# Patient Record
Sex: Female | Born: 1975 | Race: Black or African American | Hispanic: No | Smoking: Current some day smoker
Health system: Southern US, Community
[De-identification: ages and names within clinical notes are randomized; demographics above are authoritative.]

## PROBLEM LIST (undated history)

## (undated) ENCOUNTER — Ambulatory Visit: Source: Home / Self Care

## (undated) ENCOUNTER — Inpatient Hospital Stay (HOSPITAL_COMMUNITY): Payer: Self-pay

## (undated) DIAGNOSIS — R51 Headache: Secondary | ICD-10-CM

## (undated) DIAGNOSIS — J45909 Unspecified asthma, uncomplicated: Secondary | ICD-10-CM

## (undated) DIAGNOSIS — L309 Dermatitis, unspecified: Secondary | ICD-10-CM

## (undated) DIAGNOSIS — G43909 Migraine, unspecified, not intractable, without status migrainosus: Secondary | ICD-10-CM

## (undated) DIAGNOSIS — G4730 Sleep apnea, unspecified: Secondary | ICD-10-CM

## (undated) DIAGNOSIS — T7840XA Allergy, unspecified, initial encounter: Secondary | ICD-10-CM

## (undated) DIAGNOSIS — F419 Anxiety disorder, unspecified: Secondary | ICD-10-CM

## (undated) DIAGNOSIS — R519 Headache, unspecified: Secondary | ICD-10-CM

## (undated) DIAGNOSIS — M722 Plantar fascial fibromatosis: Secondary | ICD-10-CM

## (undated) DIAGNOSIS — O139 Gestational [pregnancy-induced] hypertension without significant proteinuria, unspecified trimester: Secondary | ICD-10-CM

## (undated) DIAGNOSIS — F32A Depression, unspecified: Secondary | ICD-10-CM

## (undated) DIAGNOSIS — N200 Calculus of kidney: Secondary | ICD-10-CM

## (undated) DIAGNOSIS — O039 Complete or unspecified spontaneous abortion without complication: Secondary | ICD-10-CM

## (undated) DIAGNOSIS — M1990 Unspecified osteoarthritis, unspecified site: Secondary | ICD-10-CM

## (undated) DIAGNOSIS — F329 Major depressive disorder, single episode, unspecified: Secondary | ICD-10-CM

## (undated) HISTORY — DX: Complete or unspecified spontaneous abortion without complication: O03.9

## (undated) HISTORY — DX: Allergy, unspecified, initial encounter: T78.40XA

## (undated) HISTORY — DX: Depression, unspecified: F32.A

## (undated) HISTORY — DX: Major depressive disorder, single episode, unspecified: F32.9

## (undated) HISTORY — PX: TUBAL LIGATION: SHX77

## (undated) HISTORY — DX: Anxiety disorder, unspecified: F41.9

## (undated) HISTORY — DX: Dermatitis, unspecified: L30.9

## (undated) HISTORY — DX: Headache, unspecified: R51.9

## (undated) HISTORY — DX: Sleep apnea, unspecified: G47.30

## (undated) HISTORY — DX: Unspecified asthma, uncomplicated: J45.909

## (undated) HISTORY — PX: CERVICAL CERCLAGE: SHX1329

## (undated) HISTORY — DX: Headache: R51

---

## 1997-03-04 HISTORY — PX: MOUTH SURGERY: SHX715

## 1997-06-15 ENCOUNTER — Other Ambulatory Visit: Admission: RE | Admit: 1997-06-15 | Discharge: 1997-06-15 | Payer: Self-pay | Admitting: Obstetrics & Gynecology

## 1997-09-19 ENCOUNTER — Other Ambulatory Visit: Admission: RE | Admit: 1997-09-19 | Discharge: 1997-09-19 | Payer: Self-pay | Admitting: Obstetrics & Gynecology

## 1998-05-01 ENCOUNTER — Other Ambulatory Visit: Admission: RE | Admit: 1998-05-01 | Discharge: 1998-05-01 | Payer: Self-pay | Admitting: Obstetrics & Gynecology

## 1998-11-16 ENCOUNTER — Emergency Department (HOSPITAL_COMMUNITY): Admission: EM | Admit: 1998-11-16 | Discharge: 1998-11-16 | Payer: Self-pay | Admitting: Emergency Medicine

## 1998-11-16 ENCOUNTER — Encounter: Payer: Self-pay | Admitting: Emergency Medicine

## 1998-11-22 ENCOUNTER — Other Ambulatory Visit: Admission: RE | Admit: 1998-11-22 | Discharge: 1998-11-22 | Payer: Self-pay | Admitting: Obstetrics & Gynecology

## 1999-03-05 HISTORY — PX: DILATION AND CURETTAGE OF UTERUS: SHX78

## 1999-05-13 ENCOUNTER — Emergency Department (HOSPITAL_COMMUNITY): Admission: EM | Admit: 1999-05-13 | Discharge: 1999-05-13 | Payer: Self-pay | Admitting: Emergency Medicine

## 1999-05-13 ENCOUNTER — Encounter: Payer: Self-pay | Admitting: Emergency Medicine

## 1999-05-30 ENCOUNTER — Other Ambulatory Visit: Admission: RE | Admit: 1999-05-30 | Discharge: 1999-05-30 | Payer: Self-pay | Admitting: Obstetrics & Gynecology

## 1999-08-09 ENCOUNTER — Inpatient Hospital Stay (HOSPITAL_COMMUNITY): Admission: AD | Admit: 1999-08-09 | Discharge: 1999-08-25 | Payer: Self-pay | Admitting: Obstetrics & Gynecology

## 1999-08-14 ENCOUNTER — Encounter: Payer: Self-pay | Admitting: Obstetrics & Gynecology

## 1999-08-20 ENCOUNTER — Encounter: Payer: Self-pay | Admitting: Obstetrics & Gynecology

## 1999-08-21 ENCOUNTER — Encounter: Payer: Self-pay | Admitting: Obstetrics and Gynecology

## 1999-09-12 ENCOUNTER — Other Ambulatory Visit: Admission: RE | Admit: 1999-09-12 | Discharge: 1999-09-12 | Payer: Self-pay | Admitting: Obstetrics & Gynecology

## 2000-03-08 ENCOUNTER — Emergency Department (HOSPITAL_COMMUNITY): Admission: EM | Admit: 2000-03-08 | Discharge: 2000-03-08 | Payer: Self-pay | Admitting: Emergency Medicine

## 2001-04-14 ENCOUNTER — Other Ambulatory Visit: Admission: RE | Admit: 2001-04-14 | Discharge: 2001-04-14 | Payer: Self-pay | Admitting: Obstetrics & Gynecology

## 2002-05-17 ENCOUNTER — Other Ambulatory Visit: Admission: RE | Admit: 2002-05-17 | Discharge: 2002-05-17 | Payer: Self-pay | Admitting: Obstetrics & Gynecology

## 2002-09-27 ENCOUNTER — Observation Stay (HOSPITAL_COMMUNITY): Admission: AD | Admit: 2002-09-27 | Discharge: 2002-09-27 | Payer: Self-pay | Admitting: Obstetrics and Gynecology

## 2002-10-04 ENCOUNTER — Inpatient Hospital Stay (HOSPITAL_COMMUNITY): Admission: AD | Admit: 2002-10-04 | Discharge: 2002-10-04 | Payer: Self-pay | Admitting: Obstetrics and Gynecology

## 2002-10-04 ENCOUNTER — Encounter: Payer: Self-pay | Admitting: Obstetrics and Gynecology

## 2002-10-28 ENCOUNTER — Observation Stay (HOSPITAL_COMMUNITY): Admission: RE | Admit: 2002-10-28 | Discharge: 2002-10-29 | Payer: Self-pay | Admitting: Obstetrics & Gynecology

## 2003-01-08 ENCOUNTER — Inpatient Hospital Stay (HOSPITAL_COMMUNITY): Admission: AD | Admit: 2003-01-08 | Discharge: 2003-01-08 | Payer: Self-pay | Admitting: Obstetrics & Gynecology

## 2003-01-30 ENCOUNTER — Inpatient Hospital Stay (HOSPITAL_COMMUNITY): Admission: AD | Admit: 2003-01-30 | Discharge: 2003-02-02 | Payer: Self-pay | Admitting: *Deleted

## 2003-01-31 ENCOUNTER — Encounter (INDEPENDENT_AMBULATORY_CARE_PROVIDER_SITE_OTHER): Payer: Self-pay | Admitting: Specialist

## 2003-02-03 ENCOUNTER — Encounter: Admission: RE | Admit: 2003-02-03 | Discharge: 2003-03-05 | Payer: Self-pay | Admitting: Obstetrics & Gynecology

## 2003-02-10 ENCOUNTER — Inpatient Hospital Stay (HOSPITAL_COMMUNITY): Admission: AD | Admit: 2003-02-10 | Discharge: 2003-02-10 | Payer: Self-pay | Admitting: *Deleted

## 2003-03-06 ENCOUNTER — Encounter: Admission: RE | Admit: 2003-03-06 | Discharge: 2003-04-05 | Payer: Self-pay | Admitting: Obstetrics & Gynecology

## 2003-03-08 ENCOUNTER — Other Ambulatory Visit: Admission: RE | Admit: 2003-03-08 | Discharge: 2003-03-08 | Payer: Self-pay | Admitting: Obstetrics & Gynecology

## 2004-01-03 ENCOUNTER — Emergency Department (HOSPITAL_COMMUNITY): Admission: EM | Admit: 2004-01-03 | Discharge: 2004-01-04 | Payer: Self-pay | Admitting: Emergency Medicine

## 2004-03-04 HISTORY — PX: DILATION AND CURETTAGE OF UTERUS: SHX78

## 2004-03-28 ENCOUNTER — Other Ambulatory Visit: Admission: RE | Admit: 2004-03-28 | Discharge: 2004-03-28 | Payer: Self-pay | Admitting: Obstetrics & Gynecology

## 2004-03-29 ENCOUNTER — Encounter (INDEPENDENT_AMBULATORY_CARE_PROVIDER_SITE_OTHER): Payer: Self-pay | Admitting: *Deleted

## 2004-03-29 ENCOUNTER — Ambulatory Visit (HOSPITAL_COMMUNITY): Admission: RE | Admit: 2004-03-29 | Discharge: 2004-03-29 | Payer: Self-pay | Admitting: Obstetrics & Gynecology

## 2004-06-08 ENCOUNTER — Emergency Department (HOSPITAL_COMMUNITY): Admission: EM | Admit: 2004-06-08 | Discharge: 2004-06-08 | Payer: Self-pay | Admitting: Emergency Medicine

## 2004-11-22 ENCOUNTER — Inpatient Hospital Stay (HOSPITAL_COMMUNITY): Admission: AD | Admit: 2004-11-22 | Discharge: 2004-11-22 | Payer: Self-pay | Admitting: Obstetrics and Gynecology

## 2004-11-27 ENCOUNTER — Other Ambulatory Visit: Admission: RE | Admit: 2004-11-27 | Discharge: 2004-11-27 | Payer: Self-pay | Admitting: Obstetrics & Gynecology

## 2004-12-13 ENCOUNTER — Ambulatory Visit (HOSPITAL_COMMUNITY): Admission: RE | Admit: 2004-12-13 | Discharge: 2004-12-13 | Payer: Self-pay | Admitting: Obstetrics & Gynecology

## 2005-06-06 ENCOUNTER — Inpatient Hospital Stay (HOSPITAL_COMMUNITY): Admission: AD | Admit: 2005-06-06 | Discharge: 2005-06-09 | Payer: Self-pay | Admitting: Obstetrics and Gynecology

## 2005-06-06 ENCOUNTER — Inpatient Hospital Stay (HOSPITAL_COMMUNITY): Admission: AD | Admit: 2005-06-06 | Discharge: 2005-06-06 | Payer: Self-pay | Admitting: Obstetrics and Gynecology

## 2006-03-18 ENCOUNTER — Emergency Department (HOSPITAL_COMMUNITY): Admission: EM | Admit: 2006-03-18 | Discharge: 2006-03-18 | Payer: Self-pay | Admitting: Emergency Medicine

## 2009-07-27 ENCOUNTER — Encounter: Admission: RE | Admit: 2009-07-27 | Discharge: 2009-07-27 | Payer: Self-pay | Admitting: Family Medicine

## 2009-08-15 ENCOUNTER — Emergency Department (HOSPITAL_COMMUNITY): Admission: EM | Admit: 2009-08-15 | Discharge: 2009-08-16 | Payer: Self-pay | Admitting: Emergency Medicine

## 2010-05-21 LAB — DIFFERENTIAL
Basophils Absolute: 0 10*3/uL (ref 0.0–0.1)
Basophils Relative: 0 % (ref 0–1)
Eosinophils Relative: 1 % (ref 0–5)
Lymphs Abs: 2.9 10*3/uL (ref 0.7–4.0)
Monocytes Absolute: 0.5 10*3/uL (ref 0.1–1.0)

## 2010-05-21 LAB — WET PREP, GENITAL: Trich, Wet Prep: NONE SEEN

## 2010-05-21 LAB — GC/CHLAMYDIA PROBE AMP, GENITAL
Chlamydia, DNA Probe: NEGATIVE
GC Probe Amp, Genital: NEGATIVE

## 2010-05-21 LAB — URINALYSIS, ROUTINE W REFLEX MICROSCOPIC
Hgb urine dipstick: NEGATIVE
Protein, ur: NEGATIVE mg/dL
Urobilinogen, UA: 0.2 mg/dL (ref 0.0–1.0)

## 2010-05-21 LAB — BASIC METABOLIC PANEL
BUN: 6 mg/dL (ref 6–23)
CO2: 27 mEq/L (ref 19–32)
GFR calc Af Amer: >60 mL/min (ref >60)
GFR calc non Af Amer: >60 mL/min (ref >60)
Potassium: 3.7 mEq/L (ref 3.5–5.1)
Sodium: 139 mEq/L (ref 135–145)

## 2010-05-21 LAB — CBC
Hemoglobin: 13.4 g/dL (ref 12.0–15.0)
RBC: 4.69 MIL/uL (ref 3.87–5.11)
RDW: 13.2 % (ref 11.5–15.5)

## 2010-05-21 LAB — POCT PREGNANCY, URINE: Preg Test, Ur: NEGATIVE

## 2010-07-20 NOTE — Op Note (Signed)
[facility]  Patient:    Morgan [name], Morgan [name]                      MRN: [id number] Proc. Date: 08/10/99 Adm. Date:  [id number] Attending:  Minette Headland                           Operative Report  PREOPERATIVE DIAGNOSIS:       Intrauterine pregnancy at [redacted] weeks gestation, incompetent cervix.  POSTOPERATIVE DIAGNOSIS:      Intrauterine pregnancy at [redacted] weeks gestation, incompetent cervix.  OPERATION:                    Cervical cerclage.  SURGEON:                      Freddy Finner, M.D.  ASSISTANT:  ANESTHESIA:                   Spinal.  COMPLICATIONS:                None.  ESTIMATED BLOOD LOSS:         Less than 5 cc.  INDICATIONS:                  The patient was admitted on the evening prior to surgery with lower abdominal discomfort and vaginal spotting.  She was found to be 4 cm dilated on examination in the triage area.  There were no contractions. She was admitted overnight, treated with antibiotics, and was found brought to the operating room for cerclage.  DESCRIPTION OF PROCEDURE:     She was placed under adequate spinal anesthesia, placed in the dorsal lithotomy position using the Allen stirrup system. Betadine prep of the vulva was carried out and very gentle vaginal prep.  Posterior weighted vaginal retractor was used as were Deaver retractors to retract the anterior and lateral vaginal walls.  Inspection revealed the cervix to be dilated to approximately 4 with bulging membranes present in the os, but not prolapsing through the external os.  The cervix was essentially 90% effaced.  With great care, a #1 Prolene was used with superficial passes of the suture around the external os and this was tied down to reduce the membrane.  This effectively reduced the membrane in all, but one small location on the patients right at 8 oclock. This area was approximately 1 cm.  The 4 mm Mersilene suture was then placed on  a Mayo needle and again fairly superficial bites were taken around the cervix above the placement of the Prolene suture.  This was adequately accomplished and this suture too was then tied down and this completely closed the os and reduced the membranes so that they were no longer visible or palpable.  The knots were tied for both sutures anteriorly.  The procedure at this point was terminated.  All of the instruments were removed.  The patient was taken to the recovery room in satisfactory condition. DD:  08/10/99 TD:  08/10/99 Job: [id number]

## 2010-07-20 NOTE — H&P (Signed)
   NAME:  Morgan [name], Morgan [name]                    ACCOUNT NO.:  [id number]   MEDICAL RECORD NO.:  [id number]                   PATIENT TYPE:  AMB   LOCATION:  SDC                                  FACILITY:  [facility]   PHYSICIAN:  Freddy Finner, M.D.                DATE OF BIRTH:  [birth year]   DATE OF ADMISSION:  10/28/2002  DATE OF DISCHARGE:                                HISTORY & PHYSICAL   ADMISSION DIAGNOSES:  1. Intrauterine pregnancy 12-5/[redacted] weeks gestation.  2. 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   HISTORY OF PRESENT ILLNESS:  The patient is [name] 35 year old gravida 3, para 1  with no living children, who was documented with previous pregnancy to have  incompetent cervix. She is now at 12-5/[redacted] weeks gestation. By preliminary  ultrasound, the fetus is normal. The cervical length is 4 cm but there is  funneling at the internal os on ultrasound on October 26, 2002. She is  admitted now for cervical cerclage.   REVIEW OF SYSTEMS:  Current review of systems is negative.   PAST MEDICAL HISTORY/FAMILY HISTORY:  Are recorded in detail in the prenatal  summary included in the body of the chart and will not be repeated.   PHYSICAL EXAMINATION:  HEENT: Grossly within normal limits.  NECK: Thyroid gland is not palpably enlarged.  VITAL SIGNS: Blood pressure 112/70.  CHEST: Clear to auscultation.  HEART: Normal sinus rhythm without murmur, rub, or gallop.  EXTREMITIES: Without clubbing, cyanosis, or edema.   ASSESSMENT:  Intrauterine pregnancy, 12-5/[redacted] weeks gestation, incompetent  cervix.   PLAN:  Cervical cerclage.                                               Freddy Finner, M.D.    WRN/MEDQ  D:  10/27/2002  T:  10/27/2002  Job:  [id number]

## 2010-07-20 NOTE — Discharge Summary (Signed)
[facility]  Patient:    Morgan [name], Morgan [name]                      MRN: [id number] Adm. Date:  [id number] Disc. Date: [id number] Attending:  Minette Headland Dictator:   Danie Chandler, R.N.                           Discharge Summary  ADMISSION DIAGNOSIS:              Intrauterine pregnancy at 19-6/[redacted] weeks gestation with incompetent cervix.  DISCHARGE DIAGNOSIS:              Intrauterine pregnancy at 22 weeks with spontaneous vaginal delivery of a viable female infant with Apgars of 3 at one minute and 5 at five minutes with a secondary procedure of a dilatation and evacuation and removal of cerclage.  The baby did go to NICU.  PROCEDURE:                        Admitted on August 09, 1999, spontaneous vaginal delivery of a viable female infant with Apgars at 3 at one minute and 5 at five minutes on August 23, 1999, at 11:43.  HOSPITAL COURSE:                  The patient is a 36 year old black female at 22 weeks who a week ago had a cervical cerclage placed when she presented with advanced cervical dilatation.  She had been on magnesium sulfate and terbutaline tocolytics along with antibiotics.  She experienced increased pressure and contractions earlier on the day of August 23, 1999.  She was felt to have rupture of membranes.  She was examined and was noted to be completely dilated with no membranes noted.  Tocolytics were discontinued at that point. The parents expressed an interest in having maximum resuscitation performed depending the babys status at birth.  Dr. Alison Murray in NICU was notified and talked with them prior to delivery and was in attendance at delivery which occurred in the NICU.  The patient had a very easy spontaneous delivery of a vertex, a female.  There was spontaneous breathing and good fetal movement.  The cord was clamped, and the baby was passed to Dr. Alison Murray for evaluation and assignment of Apgars.  The weight was approximately 440 g.  The  fetus was intubated and was doing reasonably well and was transferred to NICU.  The patient did not pass the placenta and, despite Pitocin, had increased clotting and bleeding.  The decision was made to proceed with a dilatation and curettage.                                    On postpartum day #1, the patient was without complaint.  Her hemoglobin was 6.7, hematocrit 20.7, and white blood cell count 12.8.  The baby was still in NICU at this time.  The patient was started on iron twice a day and had a repeat CBC ordered for August 25, 1999. On August 25, 1999, the patients hemoglobin was stable at 6.3.  She was discharged to home on this day which was postpartum day #2.  CONDITION ON DISCHARGE:           Good.  DIET:  Regular as tolerated.  ACTIVITY:                         No heavy lifting, no vaginal entry.  FOLLOWUP:                         She is to follow up in the office in four to six weeks for a postpartum exam, and she is to call for a temperature greater than 100 degrees, persistent nausea or vomiting, heavy vaginal bleeding.  DISCHARGE MEDICATIONS:            Tylenol as directed by M.D., prenatal vitamin 1 p.o. q.d. and Chromagen Forte as directed by M.D. DD:  09/24/99 TD:  09/26/99 Job: [id number]

## 2010-07-20 NOTE — H&P (Signed)
NAMESHAUN, Morgan [name]          ACCOUNT NO.:  [id number]   MEDICAL RECORD NO.:  [id number]          PATIENT TYPE:  INP   LOCATION:                                FACILITY:  [facility]   PHYSICIAN:  Freddy Finner, M.D.   DATE OF BIRTH:  November 30, 1975   DATE OF ADMISSION:  06/06/2005  DATE OF DISCHARGE:                                HISTORY & PHYSICAL   ADMITTING DIAGNOSES:  1.  Intrauterine pregnancy 37-4/[redacted] weeks gestation.  2.  Chronic hypertension/pregnancy induced hypertension, symptomatic.   Patient is a 35 year old black married female gravida 5, para 0 who has a  very poor obstetrical history including two preterm deliveries in the 25-27  week range with premature rupture of membranes by history and cervical  incompetence.  With this pregnancy she had an early cervical cerclage.  She  had weekly progestin injections from 18 weeks until 35 weeks.  She had  vaginal treatment with amoxicillin alternating with clindesse as a way of  precluding vaginitis and Strep and she progressed to 37 weeks of gestation  when her cervical cerclage was removed.  Also, at approximately 36-1/2 to 37  weeks she was noted to have increasing hypertension with diastolics in the  96-98 range.  PIH panel showed alkaline phosphatase elevated on the 2nd of  April but all other parameters of that panel were normal.  She was seen in  the office on the 4th at which time a reactive non-stress test was obtained  but her pressure was still 138/98 and she was started on labetalol 100 mg  b.i.d.  She presented the evening after that visit with a severe bilateral  periorbital headache and nausea.  She was evaluated in MAU where laboratory  findings again were noted to be normal and her headache subsided with  Tylenol and pressure dropped with bed rest.  She was discharged home, but  presented again to the office this morning again with a blood pressure of  134/98 and a mild headache.  She was also noted to be hypokalemic  on  laboratory data done at the hospital on the evening prior to this admission.  Based on this constellation of findings, the fact that she is now at 37 and  4 and was scheduled for AROM induction in the morning, she is now admitted  for further management and delivery.   REVIEW OF SYSTEMS:  Negative except as noted above.  There are no GI  symptoms at the present time.   PAST MEDICAL HISTORY:  Outlined and detailed in the prenatal summary and  will not be repeated as is the family history.   PHYSICAL EXAMINATION:  HEENT:  Grossly within normal limits.  Thyroid gland  is not palpably enlarged.  Deep tendon reflexes are 2+ with no clonus.  VITAL SIGNS:  Blood pressure 134/98.  There is a trace of protein in the  urine.  CHEST:  Clear to auscultation throughout.  HEART:  Normal sinus rhythm without murmurs, rubs, or gallops.  ABDOMEN:  Gravid.  Estimated fetal size of at least 7-1/2 pounds.  PELVIC:  Cervix:  Long, 1 cm  at the internal os, soft, vertex at a -3  station.  EXTREMITIES:  +1 edema without cyanosis or clubbing.   ASSESSMENT:  1.  Intrauterine pregnancy 37-4/[redacted] weeks gestation.  2.  Very poor obstetrical history.  3.  Chronic hypertension/pregnancy induced hypertension.   PLAN:  Admission for correction of hypokalemia and for induction of labor.      Freddy Finner, M.D.  Electronically Signed     WRN/MEDQ  D:  06/06/2005  T:  06/06/2005  Job:  [id number]

## 2010-07-20 NOTE — Op Note (Signed)
Morgan [name], Morgan [name]          ACCOUNT NO.:  [id number]   MEDICAL RECORD NO.:  [id number]          PATIENT TYPE:  AMB   LOCATION:  SDC                           FACILITY:  [facility]   PHYSICIAN:  Freddy Finner, M.D.   DATE OF BIRTH:  [birth year]   DATE OF PROCEDURE:  03/29/2004  DATE OF DISCHARGE:                                 OPERATIVE REPORT   PREOPERATIVE DIAGNOSES:  Missed abortion.   POSTOPERATIVE DIAGNOSES:  Missed abortion.   PROCEDURE:  D&E.   ANESTHESIA:  Managed intravenous sedation and paracervical block.   BLOOD TYPE:  Rh positive.   The patient is a 35 year old, gravida 3, para 2 with known history  consistent with cervical incompetence.  She presented in early pregnancy  with this pregnancy on the 25th and a complaint of vaginal spotting.  Pelvic  ultrasound revealed nonviable 6 1/2 week fetus not consistent with  gestational age of [redacted] weeks and with a large subchorionic hemorrhage. The  patient is now admitted for D&E.   She was admitted on the morning of surgery, she was brought to the operating  room, placed under adequate intravenous sedation, placed in the dorsal  lithotomy position using the Daviston stirrup system. Betadine prep was carried  out in the usual fashion, a bivalve speculum was introduced into the vagina.  The cervix was visualized and grasped on the anterior lip with a single  tooth tenaculum.  Paracervical block was placed using 10 mL of 1% Xylocaine.  The injection sites were at 4 and 8 o'clock in the vaginal fornices. The  cervix was progressively dilated with Pratt's.  An 8 mm curved suction  cannula was introduced and aspiration produced obvious parts of conception.  This was continued until it was felt that the cavity was evacuated. This was  confirmed by gentle sharp curettage, exploration with Randall stone forceps  and repeat vacuum aspiration.  The patient was given 30 mg of Toradol IV.  She was awakened and taken to the recovery room in  good condition.  She will  be given routine outpatient surgical instructions to followup in the office  in one week. She was given Darvocet to be taken as needed for postoperative  pain unrelieved by over the counter preparations like Tylenol or ibuprofen.      WRN/MEDQ  D:  03/29/2004  T:  03/29/2004  Job:  [id number]

## 2010-07-20 NOTE — Op Note (Signed)
[facility]  Patient:    MALAYAH, DEMURO                      MRN: [id number] Proc. Date: 08/23/99 Adm. Date:  [id number] Disc. Date: [id number] Attending:  Minette Headland                           Operative Report  PREOPERATIVE DIAGNOSIS:       Retained placenta.  POSTOPERATIVE DIAGNOSIS:      Retained placenta.  OPERATION:                    Dilatation and evacuation, removal of cerclage.  SURGEON:                      Duke Salvia. Marcelle Overlie, M.D.  ASSISTANT:  ANESTHESIA:                   General endotracheal.  COMPLICATIONS:                None.  DRAINS:                       Foley catheter.  ESTIMATED BLOOD LOSS:         800 cc including blood clot.  INDICATIONS:                  The patient is a 22 week IUP who a week ago had salvage cerclage when she presented with advanced cervical dilatation.  She has been on magnesium and terbutaline tocolytics along with antibiotics.  She experienced increased pressure and contractions earlier today.  She was felt to have rupture of membranes. When I went to examine her she was noted to be completely dilated with no membranes noted.   Tocolytics were discontinued at that point.  The parents expressed an interest in having maximum resuscitation performed depending on the babys status at birth.  Dr. Loann Quill in NICU was notified and talked with them prior to delivery and was in attendance for the delivery which occurred in AICU.  I was present and when she felt pressure she asked to push.  A very easy spontaneous delivery of a vertex, a female.  There was spontaneous breathing and good fetal movement.  The cord was clamped and passed to Dr. Loann Quill for evaluation and assignment of Apgars.  The weight was approximately 440 grams.  This fetus was intubated and was doing reasonably well, transferred to the NICU.  She did not pass the placenta and despite Pitocin had increased clotting and bleeding.   Decision was made to proceed with dilatation and curettage.  DESCRIPTION OF PROCEDURE:     She was taken to the operating room and adequate level of general endotracheal anesthesia was obtained.  With the legs in stirrups, the perineal and vagina were prepped and draped in the usual manner for dilatation and evacuation.  A Foley catheter was already positioned. Weighted speculum was positioned.  Clot was removed.  The cerclage had torn through the cervix which was significantly dilated. Ring forceps was used to grasp the anterior cervical lip. Ring forceps used to remove a large portion of placenta which was visible at the os.  After the large piece was removed, a large blunt curet was used to explore the cavity.  Additional tissue was removed.  Pitocin was running through the case.  when no further tissue could be removed, the procedure was discontinued.  At that point, she had good hemostasis.  The uterine walls were cleaned by palpation with the curet.  The cerclage was removed at that point.  Some bleeding from where the cerclage had torn through the anterior cervical lip was sutured with 2-0 chromic interrupted sutures.  This was observed over five minutes and noted to be hemostasis. Uterus was firm with Pitocin.  She tolerated this well and went to recovery room in good condition. DD:  08/23/99 TD:  08/26/99 Job: [id number]

## 2010-07-20 NOTE — Op Note (Signed)
NAMEALIANNAH, Morgan [name]          ACCOUNT NO.:  [id number]   MEDICAL RECORD NO.:  [id number]           PATIENT TYPE:   LOCATION:                                 FACILITY:   PHYSICIAN:  Freddy Finner, M.D.        DATE OF BIRTH:   DATE OF PROCEDURE:  12/13/2004  DATE OF DISCHARGE:                                 OPERATIVE REPORT   PREOPERATIVE DIAGNOSES:  1.  Intrauterine pregnancy 13 weeks' gestation.  2.  Cervical incompetence.   POSTOPERATIVE DIAGNOSES:  1.  Intrauterine pregnancy 13 weeks' gestation.  2.  Cervical incompetence.   OPERATIVE PROCEDURE:  Cervical cerclage.   ANESTHESIA:  Spinal.   ESTIMATED INTRAOPERATIVE BLOOD LOSS:  10 mL.   INTRAOPERATIVE COMPLICATIONS:  None.   Patient is a 35 year old with documented cervical incompetence who has had  previous second trimester pregnancy losses.  She is now admitted at [redacted] weeks  gestation for cervical cerclage.   She was admitted on the morning of surgery.  She was given a bolus of  Rocephin IV.  She was brought to the operating room, placed under spinal  anesthesia, placed in the dorsal lithotomy position.  Betadine prep with  scrub followed by solution was carried out.  Sterile drapes were applied.  A  posterior weighted vaginal retractor was placed.  Jackson retractor was used  to retract the anterior and lateral vaginal walls.  Cervix was grasped on  the anterior lip with the ring forceps.  Using 4 mm umbilical type McDonald  style procedure was accomplished with vice all four quadrants.  This suture  was tied with a total of five knots anteriorly after checking to be certain  the cervix was occluded and it was and not even admitting a fingertip.  The  procedure at this point was terminated.  The patient was brought to the  recovery room in good condition.  She will be followed there until  ambulating normally and if there are no other issues or problems she will be  discharged same for followup in the office in a  week.  She was given  Darvocet-N 100 to be taken as needed for postoperative pain unrelieved by  Tylenol.      Freddy Finner, M.D.  Electronically Signed     WRN/MEDQ  D:  12/13/2004  T:  12/13/2004  Job:  [id number]

## 2010-07-20 NOTE — Op Note (Signed)
   NAME:  Morgan [name], Morgan [name]                    ACCOUNT NO.:  [id number]   MEDICAL RECORD NO.:  [id number]                   PATIENT TYPE:  OBV   LOCATION:  9304                                 FACILITY:  [facility]   PHYSICIAN:  Freddy Finner, M.D.                DATE OF BIRTH:  10-05-1975   DATE OF PROCEDURE:  DATE OF DISCHARGE:                                 OPERATIVE REPORT   PREOPERATIVE DIAGNOSES:  1. Intrauterine pregnancy at 12-5/7 weeks' gestation.  2. Incompetent cervix.   POSTOPERATIVE DIAGNOSES:  1. Intrauterine pregnancy at 12-5/7 weeks' gestation.  2. Incompetent cervix.   OPERATIVE PROCEDURE:  Cervical cerclage.   ANESTHESIA:  Spinal.   ESTIMATED INTRAOPERATIVE BLOOD LOSS:  50 mL of less.   INTRAOPERATIVE COMPLICATIONS:  None.   Details of the present illness are recorded in the admission note.  The  patient was admitted on the morning of surgery.  She was given 1 g of Ancef  IV preoperatively.  She was brought to the operating room, there placed  under adequate spinal anesthesia, placed in the dorsal lithotomy position  using the Newbern stirrup system.  [name] Betadine prep of mons, perineum, and  vagina was carried out in the usual fashion, sterile drapes were applied.  [name]  bivalve speculum was introduced into the vagina.  Cervix was visualized.  Using the ring forceps, the cervix was grasped and [name] McDonald suture placed.  The first attempt was not considered to be adequate with placement of the  second piece of the suture or second throw, the suture on the right lower  side.  For that reason the suture was removed.  The suture was then passed  starting on the left in McDonald fashion using [name] 5 mm umbilical tape.  The  knot was tied posteriorly at the 7 o'clock position with three knots.  Palpation of the cervix revealed adequate closure with suture appropriately  in the cervix.  The procedure at this point was terminated, all instruments  removed.  She was taken to  recovery in good condition.                                                Freddy Finner, M.D.    WRN/MEDQ  D:  10/28/2002  T:  10/29/2002  Job:  [id number]

## 2011-07-03 ENCOUNTER — Ambulatory Visit (INDEPENDENT_AMBULATORY_CARE_PROVIDER_SITE_OTHER): Payer: BC Managed Care – PPO | Admitting: Family Medicine

## 2011-07-03 VITALS — BP 124/85 | HR 76 | Temp 98.7°F | Resp 16 | Ht 66.0 in | Wt 201.0 lb

## 2011-07-03 DIAGNOSIS — R350 Frequency of micturition: Secondary | ICD-10-CM

## 2011-07-03 DIAGNOSIS — J302 Other seasonal allergic rhinitis: Secondary | ICD-10-CM

## 2011-07-03 DIAGNOSIS — N390 Urinary tract infection, site not specified: Secondary | ICD-10-CM

## 2011-07-03 DIAGNOSIS — R300 Dysuria: Secondary | ICD-10-CM

## 2011-07-03 LAB — POCT UA - MICROSCOPIC ONLY
Crystals, Ur, HPF, POC: NEGATIVE
Yeast, UA: NEGATIVE

## 2011-07-03 LAB — POCT URINALYSIS DIPSTICK
Glucose, UA: NEGATIVE
Ketones, UA: NEGATIVE
Spec Grav, UA: 1.02
Urobilinogen, UA: 1
pH, UA: 7.5

## 2011-07-03 MED ORDER — FLUCONAZOLE 150 MG PO TABS: 150.0000 mg | ORAL_TABLET | Freq: Once | ORAL | Status: AC

## 2011-07-03 MED ORDER — CIPROFLOXACIN HCL 250 MG PO TABS: 250.0000 mg | ORAL_TABLET | Freq: Two times a day (BID) | ORAL | Status: AC

## 2011-07-03 MED ORDER — FLUTICASONE PROPIONATE 50 MCG/ACT NA SUSP: 2.0000 | Freq: Every day | NASAL | Status: DC

## 2011-07-03 NOTE — Progress Notes (Signed)
  Subjective:    Patient ID: Morgan [name], female    DOB: 11-22-1975, 36 y.o.   MRN: [id number]  Urinary Tract Infection  This is a new problem. The current episode started yesterday. The problem occurs every urination. The problem has been gradually worsening. The quality of the pain is described as burning. The pain is at a severity of 4/10. The pain is moderate. There has been no fever. She is sexually active. There is no history of pyelonephritis. Associated symptoms include frequency and urgency. Pertinent negatives include no nausea, possible pregnancy or vomiting. The treatment provided no relief.   Last week treated by OB for BV(Tindamax)   2) Allergies- requests refill of Flonase  Review of Systems  Gastrointestinal: Negative for nausea and vomiting.  Genitourinary: Positive for urgency and frequency.       Objective:   Physical Exam  Constitutional: She appears well-developed.  HENT:  Mouth/Throat: Oropharynx is clear and moist.  Neck: Neck supple.  Cardiovascular: Normal rate, regular rhythm and normal heart sounds.   Pulmonary/Chest: Effort normal and breath sounds normal.  Abdominal: Soft. There is Tenderness: suprapubic.Marland Kitchen  Neurological: She is alert.  Skin: Skin is warm.    Results for orders placed in visit on 07/03/11  POCT UA - MICROSCOPIC ONLY      Component Value Range   WBC, Ur, HPF, POC 10-15     RBC, urine, microscopic 1-2     Bacteria, U Microscopic neg     Mucus, UA neg     Epithelial cells, urine per micros 0-1     Crystals, Ur, HPF, POC neg     Casts, Ur, LPF, POC neg     Yeast, UA neg    POCT URINALYSIS DIPSTICK      Component Value Range   Color, UA yellow     Clarity, UA sl. cloudy     Glucose, UA neg     Bilirubin, UA neg     Ketones, UA neg     Spec Grav, UA 1.020     Blood, UA trace-lysed     pH, UA 7.5     Protein, UA trace     Urobilinogen, UA 1.0     Nitrite, UA neg     Leukocytes, UA large (3+)    URINE CULTURE   Component Value Range   Colony Count NO GROWTH     Organism ID, Bacteria NO GROWTH           Assessment & Plan:   1. Dysuria  POCT UA - Microscopic Only, POCT Urinalysis Dipstick  2. UTI  POCT UA - Microscopic Only, POCT Urinalysis Dipstick, ciprofloxacin (CIPRO) 250 MG tablet, fluconazole (DIFLUCAN) 150 MG tablet  3. Seasonal allergies  fluticasone (FLONASE) 50 MCG/ACT nasal spray    Anticipatory guidance

## 2011-07-05 LAB — URINE CULTURE
Colony Count: NO GROWTH
Organism ID, Bacteria: NO GROWTH

## 2011-11-10 ENCOUNTER — Ambulatory Visit (INDEPENDENT_AMBULATORY_CARE_PROVIDER_SITE_OTHER): Payer: BC Managed Care – PPO | Admitting: Emergency Medicine

## 2011-11-10 VITALS — BP 122/82 | HR 79 | Temp 98.5°F | Resp 16 | Ht 65.5 in | Wt 202.0 lb

## 2011-11-10 DIAGNOSIS — T300 Burn of unspecified body region, unspecified degree: Secondary | ICD-10-CM

## 2011-11-10 DIAGNOSIS — N3000 Acute cystitis without hematuria: Secondary | ICD-10-CM

## 2011-11-10 LAB — POCT URINALYSIS DIPSTICK
Bilirubin, UA: NEGATIVE
Glucose, UA: NEGATIVE
Ketones, UA: NEGATIVE
Nitrite, UA: NEGATIVE
Spec Grav, UA: 1.02
Urobilinogen, UA: 0.2
pH, UA: 7.5

## 2011-11-10 LAB — POCT UA - MICROSCOPIC ONLY
Casts, Ur, LPF, POC: NEGATIVE
Crystals, Ur, HPF, POC: NEGATIVE
Mucus, UA: NEGATIVE
Yeast, UA: NEGATIVE

## 2011-11-10 MED ORDER — PHENAZOPYRIDINE HCL 200 MG PO TABS: 200.0000 mg | ORAL_TABLET | Freq: Three times a day (TID) | ORAL | Status: AC | PRN

## 2011-11-10 MED ORDER — FLUCONAZOLE 150 MG PO TABS: 150.0000 mg | ORAL_TABLET | Freq: Once | ORAL | Status: AC

## 2011-11-10 MED ORDER — SULFAMETHOXAZOLE-TRIMETHOPRIM 800-160 MG PO TABS: 1.0000 | ORAL_TABLET | Freq: Two times a day (BID) | ORAL | Status: AC

## 2011-11-10 NOTE — Progress Notes (Signed)
  Date:  11/10/2011   Name:  Morgan [name]   DOB:  15-Nov-1975   MRN:  [id number] Gender: female Age: 36 y.o.  PCP:  No primary provider on file.    Chief Complaint: Urinary Tract Infection   History of Present Illness:  Morgan [name] is a 36 y.o. pleasant patient who presents with the following:  Was ill earlier in the week with nausea and vomiting and loose stools now improved.  Has dysuria, urgency and frequency and some low back pain .  No fever or chills, no further nausea or vomiting.  No GI, GYN symptoms.  No history of frequent UTI's  There is no problem list on file for this patient.   No past medical history on file.  No past surgical history on file.  History  Substance Use Topics  . Smoking status: Never Smoker   . Smokeless tobacco: Not on file  . Alcohol Use: Not on file    No family history on file.  Allergies  Allergen Reactions  . Stadol (Butorphanol Tartrate) Itching    Medication list has been reviewed and updated.  Current Outpatient Prescriptions on File Prior to Visit  Medication Sig Dispense Refill  . fluticasone (FLONASE) 50 MCG/ACT nasal spray Place 2 sprays into the nose daily.  1 g  6  . levonorgestrel (MIRENA) 20 MCG/24HR IUD 1 each by Intrauterine route once.        Review of Systems:  As per HPI, otherwise negative.    Physical Examination: Filed Vitals:   11/10/11 1636  BP: 122/82  Pulse: 79  Temp: 98.5 F (36.9 C)  Resp: 16   Filed Vitals:   11/10/11 1636  Height: 5' 5.5" (1.664 m)  Weight: 202 lb (91.627 kg)   Body mass index is 33.10 kg/(m^2). Ideal Body Weight: Weight in (lb) to have BMI = 25: 152.2    GEN: WDWN, NAD, Non-toxic, Alert & Oriented x 3 HEENT: Atraumatic, Normocephalic.  Ears and Nose: No external deformity. EXTR: No clubbing/cyanosis/edema NEURO: Normal gait.  PSYCH: Normally interactive. Conversant. Not depressed or anxious appearing.  Calm demeanor.  BACK:  No CVA  tenderness ABDOMEN:  Benign, soft, not tender.  Assessment and Plan: Acute cystitis Septra Pyridium Follow up as needed  Carmelina Dane, MD  Results for orders placed in visit on 11/10/11  POCT URINALYSIS DIPSTICK      Component Value Range   Color, UA yellow     Clarity, UA cloudy     Glucose, UA negative     Bilirubin, UA negative     Ketones, UA negative     Spec Grav, UA 1.020     Blood, UA small     pH, UA 7.5     Protein, UA trace     Urobilinogen, UA 0.2     Nitrite, UA negative     Leukocytes, UA large (3+)     At f/u will do STD screen if symptoms not resolved I have reviewed and agree with documentation. Robert P. Merla Riches, M.D.

## 2012-02-27 ENCOUNTER — Ambulatory Visit (INDEPENDENT_AMBULATORY_CARE_PROVIDER_SITE_OTHER): Payer: BC Managed Care – PPO | Admitting: Physician Assistant

## 2012-02-27 VITALS — BP 124/90 | HR 70 | Temp 98.6°F | Resp 16 | Ht 66.0 in | Wt 193.0 lb

## 2012-02-27 DIAGNOSIS — R350 Frequency of micturition: Secondary | ICD-10-CM

## 2012-02-27 DIAGNOSIS — R319 Hematuria, unspecified: Secondary | ICD-10-CM

## 2012-02-27 DIAGNOSIS — R300 Dysuria: Secondary | ICD-10-CM

## 2012-02-27 LAB — POCT URINALYSIS DIPSTICK
Bilirubin, UA: NEGATIVE
Nitrite, UA: NEGATIVE
Protein, UA: 100
Spec Grav, UA: 1.025

## 2012-02-27 LAB — POCT UA - MICROSCOPIC ONLY
Casts, Ur, LPF, POC: NEGATIVE
Mucus, UA: NEGATIVE
Yeast, UA: NEGATIVE

## 2012-02-27 MED ORDER — CIPROFLOXACIN HCL 500 MG PO TABS: 500.0000 mg | ORAL_TABLET | Freq: Two times a day (BID) | ORAL | Status: DC

## 2012-02-27 NOTE — Progress Notes (Signed)
Patient ID: Morgan [name] MRN: [id number], DOB: Dec 14, 1975, 36 y.o. Date of Encounter: 02/27/2012, 9:18 AM  Primary Physician: No primary provider on file.  Chief Complaint: urinary frequency and dysuria  HPI: 36 y.o. year old female with presents with 2 day history of urinary frequency, dysuria, and suprapubic pressure. Denies flank pain, nausea, vomiting, fever, chills, vaginal discharge, or odor. Has increased fluids, but states over this week she has been dehydrated. LNMP 02/12/12. Does admit to frequent UTI's.  Patient is otherwise doing well without issues or complaints.  History reviewed. No pertinent past medical history.   Home Meds: Prior to Admission medications   Medication Sig Start Date End Date Taking? Authorizing Provider  fluticasone (FLONASE) 50 MCG/ACT nasal spray Place 2 sprays into the nose daily. 07/03/11 07/02/12 Yes Dois Davenport, MD  ciprofloxacin (CIPRO) 500 MG tablet Take 1 tablet (500 mg total) by mouth 2 (two) times daily. 02/27/12   Nelva Nay, PA-C  levonorgestrel (MIRENA) 20 MCG/24HR IUD 1 each by Intrauterine route once.    Historical Provider, MD    Allergies:  Allergies  Allergen Reactions  . Stadol (Butorphanol Tartrate) Itching    History   Social History  . Marital Status: Married    Spouse Name: N/A    Number of Children: N/A  . Years of Education: N/A   Occupational History  . Not on file.   Social History Main Topics  . Smoking status: Never Smoker   . Smokeless tobacco: Not on file  . Alcohol Use: Not on file  . Drug Use: Not on file  . Sexually Active: Not on file   Other Topics Concern  . Not on file   Social History Narrative  . No narrative on file     Review of Systems: Constitutional: negative for chills, fever, night sweats, weight changes, or fatigue  HEENT: negative for vision changes, hearing loss, congestion, rhinorrhea, ST, epistaxis, or sinus pressure Cardiovascular: negative for chest pain or  palpitations Respiratory: negative for cough, hemoptysis, wheezing, shortness of breath. Abdominal: positive for suprapubic abdominal pain,   No nausea, vomiting, diarrhea, or constipation Genitourinary: positive for urinary frequency and dysuria. Negative for vaginal discharge, odor, or pelvic pain.  Dermatological: negative for rashes. Neurologic: negative for headache, dizziness, or syncope   Physical Exam: Blood pressure 124/90, pulse 70, temperature 98.6 F (37 C), temperature source Oral, resp. rate 16, height 5\' 6"  (1.676 m), weight 193 lb (87.544 kg), last menstrual period 02/12/2012, SpO2 99.00%., Body mass index is 31.15 kg/(m^2). General: Well developed, well nourished, in no acute distress. Head: Normocephalic, atraumatic, eyes without discharge, sclera non-icteric, nares are without discharge. External ear normal in appearance. Neck: Supple. No thyromegaly. Full ROM. No lymphadenopathy. Lungs: Clear bilaterally to auscultation without wheezes, rales, or rhonchi. Breathing is unlabored. Heart: RRR with S1 S2. No murmurs, rubs, or gallops appreciated. Abdominal: +BS x 4. No hepatosplenomegaly, rebound tenderness, or guarding. Positive suprapubic tenderness. No CVA tenderness bilaterally.  Msk:  Strength and tone normal for age. Extremities/Skin: Warm and dry. No clubbing or cyanosis. No edema.  Neuro: Alert and oriented X 3. Moves all extremities spontaneously. Gait is normal. CNII-XII grossly in tact. Psych:  Responds to questions appropriately with a normal affect.   Labs: Results for orders placed in visit on 02/27/12  POCT URINALYSIS DIPSTICK      Component Value Range   Color, UA yellow     Clarity, UA cloudy     Glucose, UA neg  Bilirubin, UA neg     Ketones, UA neg     Spec Grav, UA 1.025     Blood, UA moderate     pH, UA 7.0     Protein, UA 100     Urobilinogen, UA 0.2     Nitrite, UA neg     Leukocytes, UA large (3+)    POCT UA - MICROSCOPIC ONLY       Component Value Range   WBC, Ur, HPF, POC TNTC     RBC, urine, microscopic TNTC     Bacteria, U Microscopic 1+     Mucus, UA neg     Epithelial cells, urine per micros 2-6     Crystals, Ur, HPF, POC neg     Casts, Ur, LPF, POC neg     Yeast, UA neg        ASSESSMENT AND PLAN:  36 y.o. year old female with urinary tract infection Urine culture sent Increase fluids Cipro 500 mg bid x 5 days Azo as needed for symptomatic relief Follow up if symptoms worsen or fail to improve.  Grier Mitts, PA-C 02/27/2012 9:18 AM

## 2012-02-28 LAB — URINE CULTURE: Colony Count: 50000

## 2012-02-29 ENCOUNTER — Other Ambulatory Visit: Payer: Self-pay | Admitting: Physician Assistant

## 2012-02-29 MED ORDER — FLUCONAZOLE 150 MG PO TABS: 150.0000 mg | ORAL_TABLET | Freq: Once | ORAL | Status: DC

## 2012-02-29 MED ORDER — NITROFURANTOIN MONOHYD MACRO 100 MG PO CAPS: 100.0000 mg | ORAL_CAPSULE | Freq: Two times a day (BID) | ORAL | Status: DC

## 2012-04-29 ENCOUNTER — Encounter: Payer: Self-pay | Admitting: Family Medicine

## 2012-04-29 ENCOUNTER — Ambulatory Visit (INDEPENDENT_AMBULATORY_CARE_PROVIDER_SITE_OTHER): Payer: BC Managed Care – PPO | Admitting: Family Medicine

## 2012-04-29 VITALS — BP 111/76 | HR 68 | Temp 98.0°F | Resp 16 | Ht 65.5 in | Wt 198.0 lb

## 2012-04-29 DIAGNOSIS — G43909 Migraine, unspecified, not intractable, without status migrainosus: Secondary | ICD-10-CM

## 2012-04-29 MED ORDER — PROPRANOLOL HCL 10 MG PO TABS: 10.0000 mg | ORAL_TABLET | Freq: Every day | ORAL | Status: DC

## 2012-04-29 MED ORDER — TRAMADOL HCL 50 MG PO TABS: 50.0000 mg | ORAL_TABLET | Freq: Three times a day (TID) | ORAL | Status: DC | PRN

## 2012-04-29 MED ORDER — KETOROLAC TROMETHAMINE 60 MG/2ML IM SOLN
60.0000 mg | Freq: Once | INTRAMUSCULAR | Status: AC
  Administered 2012-04-29: 60 mg via INTRAMUSCULAR

## 2012-04-29 NOTE — Progress Notes (Signed)
SUBJECTIVE:  Morgan [name] is a 37 y.o. female who complains of migraine headache for 2 day(s). She has a well established history of recurrent migraines.  Patient states now it is occuring more than 2 times a month.   Description of pain: throbbing pain, dull pain, squeezing pain, unilateral in the left frontal area, unilateral in the left temporal area. Associated symptoms: light sensitivity and nausea. Patient has already taken ibuprofen for this headache without relief.  Current Outpatient Prescriptions  Medication Sig Dispense Refill  . propranolol (INDERAL) 10 MG tablet Take 1 tablet (10 mg total) by mouth daily.  30 tablet  1  . traMADol (ULTRAM) 50 MG tablet Take 1 tablet (50 mg total) by mouth every 8 (eight) hours as needed for pain.  30 tablet  0   No current facility-administered medications for this visit.    There are no associated abnormal neurological symptoms such as TIA's, loss of balance, loss of vision or speech, numbness or weakness on review. Past neurological history: negative for stroke, MS, epilepsy, or brain tumor.   OBJECTIVE:  Patient appears in pain, preferring to lie in a darkened room. Her vitals are normal. Alert and oriented x 3.  Ears and throat normal. Neck fully supple without nodes. Sinuses non tender. Cranial nerves are normal.  Fundi are normal with sharp disc margins, no papilledema, hemorrhages or exudates noted. DTR's normal and symmetric. Babinski sign absent.  Mental status normal. Cerebellar function normal.   ASSESSMENT:  Migraine headache  PLAN:  Treatment today -  see orders as documented in the electronic medical record. Will start ppx therapy with increase frequency of headaches.  ROV prn if pain does not resolve after treatment.

## 2012-07-10 ENCOUNTER — Ambulatory Visit (INDEPENDENT_AMBULATORY_CARE_PROVIDER_SITE_OTHER): Payer: BC Managed Care – PPO | Admitting: Family Medicine

## 2012-07-10 VITALS — BP 116/70 | HR 88 | Temp 98.8°F | Resp 16 | Ht 65.0 in | Wt 198.0 lb

## 2012-07-10 DIAGNOSIS — J3489 Other specified disorders of nose and nasal sinuses: Secondary | ICD-10-CM

## 2012-07-10 MED ORDER — CEFDINIR 300 MG PO CAPS: 300.0000 mg | ORAL_CAPSULE | Freq: Two times a day (BID) | ORAL | Status: DC

## 2012-07-10 MED ORDER — PREDNISONE 20 MG PO TABS: ORAL_TABLET | ORAL | Status: DC

## 2012-07-10 NOTE — Progress Notes (Signed)
[facility] [address] [place] [address] [phone]  Date:  07/10/2012   Name:  Morgan [name]   DOB:  [birth year]   MRN:  [id number]  PCP:  Nilda Simmer, MD    Chief Complaint: URI   History of Present Illness:  Morgan [name] is a 37 y.o. very pleasant female patient who presents with the following:  She notes a possible sinus infection.  She noted that her "glands swelled" 2 days ago.  She has sinus pressure and PND.   She awoke sweaty yesterday but has not noted a fever.  No body aches.  She has a mild cough.  She does have sneezing and runny nose.    No GI symptoms.    She has not tried any medications as of yet.  She has a CPAP machine with nasal pillow and has not been able to use it due to her symptoms.    She has her period right now.    There are no active problems to display for this patient.   Past Medical History  Diagnosis Date  . Allergy   . Sleep apnea   . Anxiety   . Depression     Past Surgical History  Procedure Laterality Date  . Mouth surgery  1999    History  Substance Use Topics  . Smoking status: Never Smoker   . Smokeless tobacco: Not on file  . Alcohol Use: Yes     Comment: occasional    Family History  Problem Relation Age of Onset  . HIV Mother   . Heart disease Maternal Grandfather   . Diabetes Paternal Grandmother   . Cancer Paternal Grandfather     Allergies  Allergen Reactions  . Stadol (Butorphanol Tartrate) Itching    Medication list has been reviewed and updated.  Current Outpatient Prescriptions on File Prior to Visit  Medication Sig Dispense Refill  . traMADol (ULTRAM) 50 MG tablet Take 1 tablet (50 mg total) by mouth every 8 (eight) hours as needed for pain.  30 tablet  0  . propranolol (INDERAL) 10 MG tablet Take 1 tablet (10 mg total) by mouth daily.  30 tablet  1   No current facility-administered medications on file prior to visit.    Review of Systems:  As per  HPI- otherwise negative.   Physical Examination: Filed Vitals:   07/10/12 1238  BP: 116/70  Pulse: 88  Temp: 98.8 F (37.1 C)  Resp: 16   Filed Vitals:   07/10/12 1238  Height: 5\' 5"  (1.651 m)  Weight: 198 lb (89.812 kg)   Body mass index is 32.95 kg/(m^2). Ideal Body Weight: Weight in (lb) to have BMI = 25: 149.9  GEN: WDWN, NAD, Non-toxic, A & O x 3 HEENT: Atraumatic, Normocephalic. Neck supple. No masses, No LAD. Ears and Nose: No external deformity. CV: RRR, No M/G/R. No JVD. No thrill. No extra heart sounds. PULM: CTA B, no wheezes, crackles, rhonchi. No retractions. No resp. distress. No accessory muscle use. ABD: S, NT, ND, +BS. No rebound. No HSM. EXTR: No c/c/e NEURO Normal gait.  PSYCH: Normally interactive. Conversant. Not depressed or anxious appearing.  Calm demeanor.    Assessment and Plan: Sinus pain - Plan: predniSONE (DELTASONE) 20 MG tablet, cefdinir (OMNICEF) 300 MG capsule  Sinus pain, likely due to viral URI or allergies.  It is causing her distress and interfering with her use of her CPAP machine.   rx for prednisone 40  x2, 20 x2. abx to hold but counseled her that this is unlikely to be a bacterial infection.   See patient instructions for more details.       Signed Abbe Amsterdam, MD

## 2012-07-10 NOTE — Patient Instructions (Addendum)
You most likely have a viral URI or even allergies causing your sinus pain and drainage.  Use an OTC anti- histamine such as claritin, and you can use the tramdol that you have at home for pain.  Use the prednisone as directed- this should help a lot with facial pain and swelling. If you are not doing better in a few days please give me a call- in that case we may have you fill and use the omnicef (antibiotic)

## 2012-08-16 ENCOUNTER — Ambulatory Visit (INDEPENDENT_AMBULATORY_CARE_PROVIDER_SITE_OTHER): Payer: BC Managed Care – PPO | Admitting: Emergency Medicine

## 2012-08-16 VITALS — BP 122/84 | HR 58 | Temp 98.1°F | Resp 16 | Ht 66.0 in | Wt 202.6 lb

## 2012-08-16 DIAGNOSIS — B9689 Other specified bacterial agents as the cause of diseases classified elsewhere: Secondary | ICD-10-CM

## 2012-08-16 DIAGNOSIS — N760 Acute vaginitis: Secondary | ICD-10-CM

## 2012-08-16 DIAGNOSIS — R300 Dysuria: Secondary | ICD-10-CM

## 2012-08-16 LAB — POCT URINALYSIS DIPSTICK
Bilirubin, UA: NEGATIVE
Leukocytes, UA: NEGATIVE
Protein, UA: NEGATIVE
Spec Grav, UA: 1.025

## 2012-08-16 LAB — POCT UA - MICROSCOPIC ONLY

## 2012-08-16 LAB — POCT WET PREP WITH KOH
Clue Cells Wet Prep HPF POC: 100
RBC Wet Prep HPF POC: NEGATIVE

## 2012-08-16 MED ORDER — METRONIDAZOLE 500 MG PO TABS: 500.0000 mg | ORAL_TABLET | Freq: Two times a day (BID) | ORAL | Status: DC

## 2012-08-16 NOTE — Progress Notes (Signed)
[facility] [address], [place] [address] [phone]  Date:  08/16/2012   Name:  Morgan [name]   DOB:  [birth year]   MRN:  [id number]  PCP:  Nilda Simmer, MD    Chief Complaint: Dysuria and Vaginal Discharge   History of Present Illness:  Morgan [name] is a 37 y.o. very pleasant female patient who presents with the following:  Says that she has a foul smelling vaginal discharge and dysuria.  Thinks she has BV.  Denies fever or chills, nausea or vomiting. No stool change.  No urgency or frequency.  No improvement with over the counter medications or other home remedies. Denies other complaint or health concern today.   There are no active problems to display for this patient.   Past Medical History  Diagnosis Date  . Allergy   . Sleep apnea   . Anxiety   . Depression   . Asthma     Past Surgical History  Procedure Laterality Date  . Mouth surgery  1999    History  Substance Use Topics  . Smoking status: Never Smoker   . Smokeless tobacco: Not on file  . Alcohol Use: Yes     Comment: occasional    Family History  Problem Relation Age of Onset  . HIV Mother   . Heart disease Maternal Grandfather   . Diabetes Paternal Grandmother   . Cancer Paternal Grandfather     Allergies  Allergen Reactions  . Stadol (Butorphanol Tartrate) Itching    Medication list has been reviewed and updated.  Current Outpatient Prescriptions on File Prior to Visit  Medication Sig Dispense Refill  . cefdinir (OMNICEF) 300 MG capsule Take 1 capsule (300 mg total) by mouth 2 (two) times daily.  20 capsule  0  . predniSONE (DELTASONE) 20 MG tablet Take 2 tablets daily for 2 days, then 1 tablet daily for 2 days  6 tablet  0  . traMADol (ULTRAM) 50 MG tablet Take 1 tablet (50 mg total) by mouth every 8 (eight) hours as needed for pain.  30 tablet  0   No current facility-administered medications on file prior to visit.    Review of  Systems:  As per HPI, otherwise negative.    Physical Examination: Filed Vitals:   08/16/12 0919  BP: 122/84  Pulse: 58  Temp: 98.1 F (36.7 C)  Resp: 16   Filed Vitals:   08/16/12 0919  Height: 5\' 6"  (1.676 m)  Weight: 202 lb 9.6 oz (91.899 kg)   Body mass index is 32.72 kg/(m^2). Ideal Body Weight: Weight in (lb) to have BMI = 25: 154.6  GEN: WDWN, NAD, Non-toxic, A & O x 3 HEENT: Atraumatic, Normocephalic. Neck supple. No masses, No LAD. Ears and Nose: No external deformity. CV: RRR, No M/G/R. No JVD. No thrill. No extra heart sounds. PULM: CTA B, no wheezes, crackles, rhonchi. No retractions. No resp. distress. No accessory muscle use. ABD: S, NT, ND, +BS. No rebound. No HSM. EXTR: No c/c/e NEURO Normal gait.  PSYCH: Normally interactive. Conversant. Not depressed or anxious appearing.  Calm demeanor.  Pelvic - normal external genitalia, vulva, vagina, cervix, uterus and adnexa   Assessment and Plan: BV Flagyl   Signed,  Phillips Odor, MD  Results for orders placed in visit on 08/16/12  POCT UA - MICROSCOPIC ONLY      Result Value Range   WBC, Ur, HPF, POC 10-15     RBC, urine, microscopic  1-2     Bacteria, U Microscopic trace     Mucus, UA trace     Epithelial cells, urine per micros 0-4     Crystals, Ur, HPF, POC neg     Casts, Ur, LPF, POC neg     Yeast, UA neg    POCT URINALYSIS DIPSTICK      Result Value Range   Color, UA yellow     Clarity, UA clear     Glucose, UA neg     Bilirubin, UA neg     Ketones, UA neg     Spec Grav, UA 1.025     Blood, UA trace-intact     pH, UA 6.0     Protein, UA neg     Urobilinogen, UA 0.2     Nitrite, UA neg     Leukocytes, UA Negative    POCT WET PREP WITH KOH      Result Value Range   Trichomonas, UA Negative     Clue Cells Wet Prep HPF POC 100%     Epithelial Wet Prep HPF POC 4-10     Yeast Wet Prep HPF POC neg     Bacteria Wet Prep HPF POC 4+     RBC Wet Prep HPF POC neg     WBC Wet Prep HPF POC  10-15     KOH Prep POC Negative

## 2012-08-16 NOTE — Patient Instructions (Signed)
Bacterial Vaginosis Bacterial vaginosis (BV) is a vaginal infection where the normal balance of bacteria in the vagina is disrupted. The normal balance is then replaced by an overgrowth of certain bacteria. There are several different kinds of bacteria that can cause BV. BV is the most common vaginal infection in women of childbearing age. CAUSES   The cause of BV is not fully understood. BV develops when there is an increase or imbalance of harmful bacteria.  Some activities or behaviors can upset the normal balance of bacteria in the vagina and put women at increased risk including:  Having a new sex partner or multiple sex partners.  Douching.  Using an intrauterine device (IUD) for contraception.  It is not clear what role sexual activity plays in the development of BV. However, women that have never had sexual intercourse are rarely infected with BV. Women do not get BV from toilet seats, bedding, swimming pools or from touching objects around them.  SYMPTOMS   Grey vaginal discharge.  A fish-like odor with discharge, especially after sexual intercourse.  Itching or burning of the vagina and vulva.  Burning or pain with urination.  Some women have no signs or symptoms at all. DIAGNOSIS  Your caregiver must examine the vagina for signs of BV. Your caregiver will perform lab tests and look at the sample of vaginal fluid through a microscope. They will look for bacteria and abnormal cells (clue cells), a pH test higher than 4.5, and a positive amine test all associated with BV.  RISKS AND COMPLICATIONS   Pelvic inflammatory disease (PID).  Infections following gynecology surgery.  Developing HIV.  Developing herpes virus. TREATMENT  Sometimes BV will clear up without treatment. However, all women with symptoms of BV should be treated to avoid complications, especially if gynecology surgery is planned. Female partners generally do not need to be treated. However, BV may spread  between female sex partners so treatment is helpful in preventing a recurrence of BV.   BV may be treated with antibiotics. The antibiotics come in either pill or vaginal cream forms. Either can be used with nonpregnant or pregnant women, but the recommended dosages differ. These antibiotics are not harmful to the baby.  BV can recur after treatment. If this happens, a second round of antibiotics will often be prescribed.  Treatment is important for pregnant women. If not treated, BV can cause a premature delivery, especially for a pregnant woman who had a premature birth in the past. All pregnant women who have symptoms of BV should be checked and treated.  For chronic reoccurrence of BV, treatment with a type of prescribed gel vaginally twice a week is helpful. HOME CARE INSTRUCTIONS   Finish all medication as directed by your caregiver.  Do not have sex until treatment is completed.  Tell your sexual partner that you have a vaginal infection. They should see their caregiver and be treated if they have problems, such as a mild rash or itching.  Practice safe sex. Use condoms. Only have 1 sex partner. PREVENTION  Basic prevention steps can help reduce the risk of upsetting the natural balance of bacteria in the vagina and developing BV:  Do not have sexual intercourse (be abstinent).  Do not douche.  Use all of the medicine prescribed for treatment of BV, even if the signs and symptoms go away.  Tell your sex partner if you have BV. That way, they can be treated, if needed, to prevent reoccurrence. SEEK MEDICAL CARE IF:     Your symptoms are not improving after 3 days of treatment.  You have increased discharge, pain, or fever. MAKE SURE YOU:   Understand these instructions.  Will watch your condition.  Will get help right away if you are not doing well or get worse. FOR MORE INFORMATION  Division of STD Prevention (DSTDP), Centers for Disease Control and Prevention:  www.cdc.gov/std American Social Health Association (ASHA): www.ashastd.org  Document Released: 02/18/2005 Document Revised: 05/13/2011 Document Reviewed: 08/11/2008 Exit[employer] Patient Information 2014 [employer].  

## 2012-10-04 ENCOUNTER — Ambulatory Visit (INDEPENDENT_AMBULATORY_CARE_PROVIDER_SITE_OTHER): Payer: BC Managed Care – PPO | Admitting: Family Medicine

## 2012-10-04 VITALS — BP 108/80 | HR 91 | Temp 98.0°F | Resp 18 | Wt 202.0 lb

## 2012-10-04 DIAGNOSIS — B9689 Other specified bacterial agents as the cause of diseases classified elsewhere: Secondary | ICD-10-CM

## 2012-10-04 DIAGNOSIS — N760 Acute vaginitis: Secondary | ICD-10-CM

## 2012-10-04 DIAGNOSIS — N898 Other specified noninflammatory disorders of vagina: Secondary | ICD-10-CM

## 2012-10-04 LAB — POCT WET PREP WITH KOH
Trichomonas, UA: NEGATIVE
Yeast Wet Prep HPF POC: NEGATIVE

## 2012-10-04 MED ORDER — METRONIDAZOLE 500 MG PO TABS: 500.0000 mg | ORAL_TABLET | Freq: Two times a day (BID) | ORAL | Status: DC

## 2012-10-04 NOTE — Progress Notes (Addendum)
[facility] [address], [place] [address] [phone]  Date:  10/04/2012   Name:  Morgan [name]   DOB:  03-Jul-1975   MRN:  [id number]  PCP:  Nilda Simmer, MD    Chief Complaint: Vaginitis   History of Present Illness:  Morgan [name] is a 37 y.o. very pleasant female patient who presents with the following:  She thinks she has BV again- she was treated for this in June.   She notes unusual discharge and odor.  This came back over the last few days.  No dysuria.   She does not have vomiting, has noted some "mild cramping" but thinks this is due to ovulation.   Her LMP was on 7/23, she gets her menses every 23 days.  She does not believe she is at risk for STI, but is ok with being checked   There are no active problems to display for this patient.   Past Medical History  Diagnosis Date  . Allergy   . Sleep apnea   . Anxiety   . Depression   . Asthma     Past Surgical History  Procedure Laterality Date  . Mouth surgery  1999    History  Substance Use Topics  . Smoking status: Never Smoker   . Smokeless tobacco: Not on file  . Alcohol Use: Yes     Comment: occasional    Family History  Problem Relation Age of Onset  . HIV Mother   . Heart disease Maternal Grandfather   . Diabetes Paternal Grandmother   . Cancer Paternal Grandfather     Allergies  Allergen Reactions  . Stadol (Butorphanol Tartrate) Itching    Medication list has been reviewed and updated.  Current Outpatient Prescriptions on File Prior to Visit  Medication Sig Dispense Refill  . metroNIDAZOLE (FLAGYL) 500 MG tablet Take 1 tablet (500 mg total) by mouth 2 (two) times daily with a meal. DO NOT CONSUME ALCOHOL WHILE TAKING THIS MEDICATION.  14 tablet  0   No current facility-administered medications on file prior to visit.    Review of Systems:  As per HPI- otherwise negative.   Physical Examination: Filed Vitals:   10/04/12 1051  BP:  108/80  Pulse: 91  Temp: 98 F (36.7 C)  Resp: 18   Filed Vitals:   10/04/12 1051  Weight: 202 lb (91.627 kg)   Body mass index is 32.62 kg/(m^2). Ideal Body Weight:    GEN: WDWN, NAD, Non-toxic, A & O x 3, overweight HEENT: Atraumatic, Normocephalic. Neck supple. No masses, No LAD. Ears and Nose: No external deformity. CV: RRR, No M/G/R. No JVD. No thrill. No extra heart sounds. PULM: CTA B, no wheezes, crackles, rhonchi. No retractions. No resp. distress. No accessory muscle use. ABD: S, NT, ND. No rebound. No HSM. Gu: normal, no unusual discharge, no CMT, no adnexal masses or tenderness EXTR: No c/c/e NEURO Normal gait.  PSYCH: Normally interactive. Conversant. Not depressed or anxious appearing.  Calm demeanor.    Results for orders placed in visit on 10/04/12  POCT WET PREP WITH KOH      Result Value Range   Trichomonas, UA Negative     Clue Cells Wet Prep HPF POC tntc     Epithelial Wet Prep HPF POC 1-3     Yeast Wet Prep HPF POC neg     Bacteria Wet Prep HPF POC 3+     RBC Wet Prep HPF POC 0-2  WBC Wet Prep HPF POC 15-23     KOH Prep POC Negative      Assessment and Plan: Bacterial vaginosis - Plan: metroNIDAZOLE (FLAGYL) 500 MG tablet  Vaginal discharge - Plan: POCT Wet Prep with KOH, GC/Chlamydia Probe Amp  Treat for BV with flagyl, await genprobe.  Let me know if not better, Sooner if worse.      Signed Abbe Amsterdam, MD  8/4- received genprobe results- negative. Called but both her home and cell numbers are disconnected.  Send letter

## 2012-10-04 NOTE — Patient Instructions (Addendum)
Let me know if you are not better in the next few days- Sooner if worse.

## 2012-10-05 ENCOUNTER — Encounter: Payer: Self-pay | Admitting: Family Medicine

## 2012-11-15 ENCOUNTER — Ambulatory Visit (INDEPENDENT_AMBULATORY_CARE_PROVIDER_SITE_OTHER): Payer: BC Managed Care – PPO | Admitting: Physician Assistant

## 2012-11-15 VITALS — BP 118/80 | HR 85 | Temp 98.4°F | Resp 18 | Ht 65.5 in | Wt 204.0 lb

## 2012-11-15 DIAGNOSIS — N898 Other specified noninflammatory disorders of vagina: Secondary | ICD-10-CM

## 2012-11-15 DIAGNOSIS — B9689 Other specified bacterial agents as the cause of diseases classified elsewhere: Secondary | ICD-10-CM

## 2012-11-15 DIAGNOSIS — N760 Acute vaginitis: Secondary | ICD-10-CM

## 2012-11-15 DIAGNOSIS — L293 Anogenital pruritus, unspecified: Secondary | ICD-10-CM

## 2012-11-15 LAB — POCT UA - MICROSCOPIC ONLY
Casts, Ur, LPF, POC: NEGATIVE
Crystals, Ur, HPF, POC: NEGATIVE
Mucus, UA: NEGATIVE
Yeast, UA: NEGATIVE

## 2012-11-15 LAB — POCT URINALYSIS DIPSTICK
Leukocytes, UA: NEGATIVE
Nitrite, UA: NEGATIVE
Protein, UA: NEGATIVE
Urobilinogen, UA: 0.2
pH, UA: 7

## 2012-11-15 LAB — POCT WET PREP WITH KOH
KOH Prep POC: NEGATIVE
WBC Wet Prep HPF POC: NEGATIVE

## 2012-11-15 MED ORDER — METRONIDAZOLE 500 MG PO TABS: 500.0000 mg | ORAL_TABLET | Freq: Two times a day (BID) | ORAL | Status: DC

## 2012-11-15 NOTE — Progress Notes (Signed)
Subjective:    Patient ID: Morgan [name], female    DOB: Jul 18, 1975, 37 y.o.   MRN: [id number]  HPI   Morgan [name] is a very pleasant 37 yr old female here with concern for BV.  Last had BV about a month ago.  Seems to be getting BV more frequently lately.  She and her husband occasionally use condoms, and she thinks maybe switching back and forth may trigger BV episodes.  Currently experiencing vaginal odor and itching.  She is currently on her period so not sure if she is having any discharge.  Some cramping - but this may be due to period.  No urinary symptoms.  Married, monogamous.  No concern for STI.  Tested in Aug 2014, all neg.  No dyspareunia   Review of Systems  Constitutional: Negative.   HENT: Negative.   Respiratory: Negative.   Cardiovascular: Negative.   Gastrointestinal: Negative.   Genitourinary: Negative for dysuria, frequency and hematuria.       Vaginal itching, odor       Objective:   Physical Exam  Vitals reviewed. Constitutional: She is oriented to person, place, and time. She appears well-developed and well-nourished. No distress.  HENT:  Head: Normocephalic and atraumatic.  Eyes: Conjunctivae are normal. No scleral icterus.  Cardiovascular: Normal rate, regular rhythm and normal heart sounds.   Pulmonary/Chest: Effort normal and breath sounds normal.  Abdominal: Soft. There is tenderness (diffuse lower abd).  Neurological: She is alert and oriented to person, place, and time.  Skin: Skin is warm and dry.  Psychiatric: She has a normal mood and affect. Her behavior is normal.   Self-collected wet prep   Results for orders placed in visit on 11/15/12  POCT UA - MICROSCOPIC ONLY      Result Value Range   WBC, Ur, HPF, POC 3-4     RBC, urine, microscopic 5-7     Bacteria, U Microscopic trace     Mucus, UA neg     Epithelial cells, urine per micros 2-4     Crystals, Ur, HPF, POC neg     Casts, Ur, LPF, POC neg     Yeast, UA neg    POCT  URINALYSIS DIPSTICK      Result Value Range   Color, UA yellow     Clarity, UA clear     Glucose, UA neg     Bilirubin, UA neg     Ketones, UA neg     Spec Grav, UA 1.020     Blood, UA mod     pH, UA 7.0     Protein, UA neg     Urobilinogen, UA 0.2     Nitrite, UA neg     Leukocytes, UA Negative    POCT WET PREP WITH KOH      Result Value Range   Trichomonas, UA Negative     Clue Cells Wet Prep HPF POC 16-26     Epithelial Wet Prep HPF POC 9-20     Yeast Wet Prep HPF POC neg     Bacteria Wet Prep HPF POC 2+     RBC Wet Prep HPF POC tntc     WBC Wet Prep HPF POC neg     KOH Prep POC Negative           Assessment & Plan:  Bacterial vaginosis - Plan: metroNIDAZOLE (FLAGYL) 500 MG tablet  Vaginal itching - Plan: POCT UA - Microscopic Only, POCT urinalysis dipstick, POCT Wet  Prep with KOH   Morgan [name] is a very pleasant 37 yr old female with BV.  Will treat with Flagyl x 7 days.  Encouraged pt to be aware of products, practices that may contribute to BV development.  UA is clear.  Genprob neg 1 month ago.  RTC if worsening or not improving.

## 2012-11-15 NOTE — Patient Instructions (Addendum)
Take the metronidazole as directed.  Be sure to finish the full course.  DO NOT DRINK ALCOHOL WHILE TAKING THIS MEDICINE or for 48 hours after your last dose.  Let us know if any symptoms are worsening or not improving.   Bacterial Vaginosis Bacterial vaginosis (BV) is a vaginal infection where the normal balance of bacteria in the vagina is disrupted. The normal balance is then replaced by an overgrowth of certain bacteria. There are several different kinds of bacteria that can cause BV. BV is the most common vaginal infection in women of childbearing age. CAUSES   The cause of BV is not fully understood. BV develops when there is an increase or imbalance of harmful bacteria.  Some activities or behaviors can upset the normal balance of bacteria in the vagina and put women at increased risk including:  Having a new sex partner or multiple sex partners.  Douching.  Using an intrauterine device (IUD) for contraception.  It is not clear what role sexual activity plays in the development of BV. However, women that have never had sexual intercourse are rarely infected with BV. Women do not get BV from toilet seats, bedding, swimming pools or from touching objects around them.  SYMPTOMS   Grey vaginal discharge.  A fish-like odor with discharge, especially after sexual intercourse.  Itching or burning of the vagina and vulva.  Burning or pain with urination.  Some women have no signs or symptoms at all. DIAGNOSIS  Your caregiver must examine the vagina for signs of BV. Your caregiver will perform lab tests and look at the sample of vaginal fluid through a microscope. They will look for bacteria and abnormal cells (clue cells), a pH test higher than 4.5, and a positive amine test all associated with BV.  RISKS AND COMPLICATIONS   Pelvic inflammatory disease (PID).  Infections following gynecology surgery.  Developing HIV.  Developing herpes virus. TREATMENT  Sometimes BV will  clear up without treatment. However, all women with symptoms of BV should be treated to avoid complications, especially if gynecology surgery is planned. Female partners generally do not need to be treated. However, BV may spread between female sex partners so treatment is helpful in preventing a recurrence of BV.   BV may be treated with antibiotics. The antibiotics come in either pill or vaginal cream forms. Either can be used with nonpregnant or pregnant women, but the recommended dosages differ. These antibiotics are not harmful to the baby.  BV can recur after treatment. If this happens, a second round of antibiotics will often be prescribed.  Treatment is important for pregnant women. If not treated, BV can cause a premature delivery, especially for a pregnant woman who had a premature birth in the past. All pregnant women who have symptoms of BV should be checked and treated.  For chronic reoccurrence of BV, treatment with a type of prescribed gel vaginally twice a week is helpful. HOME CARE INSTRUCTIONS   Finish all medication as directed by your caregiver.  Do not have sex until treatment is completed.  Tell your sexual partner that you have a vaginal infection. They should see their caregiver and be treated if they have problems, such as a mild rash or itching.  Practice safe sex. Use condoms. Only have 1 sex partner. PREVENTION  Basic prevention steps can help reduce the risk of upsetting the natural balance of bacteria in the vagina and developing BV:  Do not have sexual intercourse (be abstinent).  Do not douche.  Use all of the medicine prescribed for treatment of BV, even if the signs and symptoms go away.  Tell your sex partner if you have BV. That way, they can be treated, if needed, to prevent reoccurrence. SEEK MEDICAL CARE IF:   Your symptoms are not improving after 3 days of treatment.  You have increased discharge, pain, or fever. MAKE SURE YOU:   Understand  these instructions.  Will watch your condition.  Will get help right away if you are not doing well or get worse. FOR MORE INFORMATION  Division of STD Prevention (DSTDP), Centers for Disease Control and Prevention: [employer] American Social Health Association (ASHA): www.ashastd.org  Document Released: 02/18/2005 Document Revised: 05/13/2011 Document Reviewed: 08/11/2008 [facility] Patient Information 2014 [place], [place].

## 2012-12-14 ENCOUNTER — Emergency Department (HOSPITAL_COMMUNITY)
Admission: EM | Admit: 2012-12-14 | Discharge: 2012-12-15 | Disposition: A | Discharge status: Home / Self Care | Payer: BC Managed Care – PPO | Attending: Emergency Medicine | Admitting: Emergency Medicine

## 2012-12-14 ENCOUNTER — Encounter (HOSPITAL_COMMUNITY): Payer: Self-pay | Admitting: Emergency Medicine

## 2012-12-14 DIAGNOSIS — Z8679 Personal history of other diseases of the circulatory system: Secondary | ICD-10-CM | POA: Insufficient documentation

## 2012-12-14 DIAGNOSIS — Z79899 Other long term (current) drug therapy: Secondary | ICD-10-CM | POA: Insufficient documentation

## 2012-12-14 DIAGNOSIS — R51 Headache: Secondary | ICD-10-CM | POA: Insufficient documentation

## 2012-12-14 DIAGNOSIS — F329 Major depressive disorder, single episode, unspecified: Secondary | ICD-10-CM | POA: Insufficient documentation

## 2012-12-14 DIAGNOSIS — J45909 Unspecified asthma, uncomplicated: Secondary | ICD-10-CM | POA: Insufficient documentation

## 2012-12-14 DIAGNOSIS — F3289 Other specified depressive episodes: Secondary | ICD-10-CM | POA: Insufficient documentation

## 2012-12-14 DIAGNOSIS — F411 Generalized anxiety disorder: Secondary | ICD-10-CM | POA: Insufficient documentation

## 2012-12-14 MED ORDER — DIPHENHYDRAMINE HCL 50 MG/ML IJ SOLN
25.0000 mg | Freq: Once | INTRAMUSCULAR | Status: AC
  Administered 2012-12-15: 25 mg via INTRAVENOUS
  Filled 2012-12-14: qty 1

## 2012-12-14 MED ORDER — KETOROLAC TROMETHAMINE 30 MG/ML IJ SOLN
30.0000 mg | Freq: Once | INTRAMUSCULAR | Status: AC
  Administered 2012-12-15: 30 mg via INTRAVENOUS
  Filled 2012-12-14: qty 1

## 2012-12-14 MED ORDER — METOCLOPRAMIDE HCL 5 MG/ML IJ SOLN
10.0000 mg | Freq: Once | INTRAMUSCULAR | Status: AC
  Administered 2012-12-15: 10 mg via INTRAVENOUS
  Filled 2012-12-14: qty 2

## 2012-12-14 NOTE — ED Notes (Signed)
Pt states that she has hx of ha but today around 07:00am she started having a HA.pt states this HA is worse than her others she gets. Pt states that she is also having pain in her face, down her neck and nausea. Pt alert and oriented, no neuro deficits. Pt able to follow commands. Pt took 2 aleve around 15:30

## 2012-12-15 NOTE — ED Provider Notes (Signed)
CSN: [id number]     Arrival date & time 12/14/12  1951 History   First MD Initiated Contact with Patient 12/14/12 2258     Chief Complaint  Patient presents with  . Headache   (Consider location/radiation/quality/duration/timing/severity/associated sxs/prior Treatment) HPI 37 year old female presents to emergency department with complaint of headache.  She reports history of migraine, symptoms are consistent with her prior migraine.  She reports this is starting on her antidepressants, however, she has not had any headaches.  She took ibuprofen without improvement in symptoms.  Patient has photo and phonophobia.  She has had nausea.  Pain is right-sided and travels to her face and her neck.  She does not have any fever or chills.  No neck stiffness.  No weakness or numbness or other focal neurologic deficit. Past Medical History  Diagnosis Date  . Allergy   . Sleep apnea   . Anxiety   . Depression   . Asthma    Past Surgical History  Procedure Laterality Date  . Mouth surgery  1999   Family History  Problem Relation Age of Onset  . HIV Mother   . Heart disease Maternal Grandfather   . Diabetes Paternal Grandmother   . Cancer Paternal Grandfather    History  Substance Use Topics  . Smoking status: Never Smoker   . Smokeless tobacco: Not on file  . Alcohol Use: Yes     Comment: occasional   OB History   Grav Para Term Preterm Abortions TAB SAB Ect Mult Living                 Review of Systems  All other systems reviewed and are negative.    Allergies  Stadol and Kale  Home Medications   Current Outpatient Rx  Name  Route  Sig  Dispense  Refill  . citalopram (CELEXA) 20 MG tablet   Oral   Take 20 mg by mouth daily.         . fluticasone (FLONASE) 50 MCG/ACT nasal spray   Nasal   Place 2 sprays into the nose daily as needed for allergies.         . naproxen sodium (ANAPROX) 220 MG tablet   Oral   Take 440 mg by mouth daily as needed (pain).          . Prenatal Vit-Fe Fumarate-FA (MULTIVITAMIN-PRENATAL) 27-0.8 MG TABS tablet   Oral   Take 1 tablet by mouth daily at 12 noon.         . tinidazole (TINDAMAX) 500 MG tablet   Oral   Take 1,000 mg by mouth 2 (two) times daily.         . traZODone (DESYREL) 50 MG tablet   Oral   Take 25 mg by mouth at bedtime as needed for sleep.          BP 118/77  Pulse 60  Temp(Src) 97.9 F (36.6 C) (Oral)  Resp 16  SpO2 100%  LMP 11/04/2012 Physical Exam  Constitutional: She is oriented to person, place, and time. She appears well-developed and well-nourished. She appears distressed (uncomfortable appearing).  HENT:  Head: Normocephalic and atraumatic.  Right Ear: External ear normal.  Left Ear: External ear normal.  Nose: Nose normal.  Mouth/Throat: Oropharynx is clear and moist.  Patient has fullness behind both TMs without signs of infection  Eyes: Conjunctivae and EOM are normal. Pupils are equal, round, and reactive to light.  Neck: Normal range of motion. Neck supple. No JVD  present. No tracheal deviation present. No thyromegaly present.  Cardiovascular: Normal rate, regular rhythm, normal heart sounds and intact distal pulses.  Exam reveals no gallop and no friction rub.   No murmur heard. Pulmonary/Chest: Effort normal and breath sounds normal. No stridor. No respiratory distress. She has no wheezes. She has no rales. She exhibits no tenderness.  Abdominal: Soft. Bowel sounds are normal. She exhibits no distension and no mass. There is no tenderness. There is no rebound and no guarding.  Musculoskeletal: Normal range of motion. She exhibits no edema and no tenderness.  Lymphadenopathy:    She has no cervical adenopathy.  Neurological: She is alert and oriented to person, place, and time. She has normal reflexes. No cranial nerve deficit. She exhibits normal muscle tone. Coordination normal.  Skin: Skin is warm and dry. No rash noted. No erythema. No pallor.  Psychiatric: She  has a normal mood and affect. Her behavior is normal. Judgment and thought content normal.    ED Course  Procedures (including critical care time) Labs Review Labs Reviewed - No data to display Imaging Review No results found.  EKG Interpretation   None       MDM   1. Headache    37 year old female with headache.  She's feeling better after the headache cocktail.  She is ready to go home.    Olivia Mackie, MD 12/15/12 [phone]

## 2013-01-19 ENCOUNTER — Ambulatory Visit (INDEPENDENT_AMBULATORY_CARE_PROVIDER_SITE_OTHER): Payer: BC Managed Care – PPO | Admitting: Physician Assistant

## 2013-01-19 VITALS — BP 120/68 | HR 72 | Temp 98.7°F | Resp 16 | Ht 65.5 in | Wt 200.4 lb

## 2013-01-19 DIAGNOSIS — N760 Acute vaginitis: Secondary | ICD-10-CM

## 2013-01-19 DIAGNOSIS — B9689 Other specified bacterial agents as the cause of diseases classified elsewhere: Secondary | ICD-10-CM

## 2013-01-19 DIAGNOSIS — O9989 Other specified diseases and conditions complicating pregnancy, childbirth and the puerperium: Secondary | ICD-10-CM

## 2013-01-19 DIAGNOSIS — N898 Other specified noninflammatory disorders of vagina: Secondary | ICD-10-CM

## 2013-01-19 DIAGNOSIS — R112 Nausea with vomiting, unspecified: Secondary | ICD-10-CM

## 2013-01-19 LAB — POCT WET PREP WITH KOH
Epithelial Wet Prep HPF POC: NEGATIVE
Trichomonas, UA: NEGATIVE
Yeast Wet Prep HPF POC: NEGATIVE

## 2013-01-19 MED ORDER — METRONIDAZOLE 250 MG PO TABS: 250.0000 mg | ORAL_TABLET | Freq: Three times a day (TID) | ORAL | Status: DC

## 2013-01-19 MED ORDER — ONDANSETRON 8 MG PO TBDP: 8.0000 mg | ORAL_TABLET | Freq: Three times a day (TID) | ORAL | Status: DC | PRN

## 2013-01-19 NOTE — Progress Notes (Signed)
[address]  [place], [place]  [phone]  [email or web address]  Subjective:    Patient ID: Morgan [name], female    DOB: 1975-10-30, 37 y.o.   MRN: [id number]  HPI   [name] is a very pleasant 37 yr old female here with concern for bacterial vaginosis.  This is a recurrent issue for her.  Last here in Sept for BV, reports was treated one time since then by OBGYN.  Experiencing vaginal discharge and odor x 1 wk.  Denies urinary symptoms.    Pt is about [redacted] wks pregnant.  LMP 12/10/12.  Was not trying to conceive.  She reports that she is not continuing with the pregnancy.  Has an appt to terminate 11/29.  She is G6P3 - reports 2 miscarriages, 2 preterm births, and 1 live birth.  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  She is established with OBGYN.  Reports she did have some vaginal bleeding with last intercourse which she reports has been typical of her previous pregnancies.  She reports significant nausea and vomiting which is also typical of her previous pregnancies.  Reports she has lost 5lbs already.  Difficulty keeping anything down.  This has kept her out of work.  She works for [employer] and has been trying to pick up over time but has not been able to.   Review of Systems  Constitutional: Negative for fever and chills.  Respiratory: Negative.   Cardiovascular: Negative.   Gastrointestinal: Positive for nausea and vomiting.  Genitourinary: Positive for vaginal bleeding and vaginal discharge. Negative for dysuria, frequency and hematuria.  Musculoskeletal: Negative.   Skin: Negative.   Neurological: Negative.        Objective:   Physical Exam  Vitals reviewed. Constitutional: She is oriented to person, place, and time. She appears well-developed and well-nourished. No distress.  HENT:  Head: Normocephalic and atraumatic.  Eyes: Conjunctivae are normal. No scleral icterus.  Cardiovascular: Normal rate, regular rhythm and normal heart sounds.   Pulmonary/Chest: Effort normal and  breath sounds normal. She has no wheezes. She has no rales.  Abdominal: Soft. There is no tenderness.  Genitourinary: There is no rash, tenderness or lesion on the right labia. There is no rash, tenderness or lesion on the left labia. Cervix exhibits no motion tenderness, no discharge and no friability. Right adnexum displays no mass, no tenderness and no fullness. Left adnexum displays no mass, no tenderness and no fullness. Vaginal discharge (thin, yellow/white) found.  Neurological: She is alert and oriented to person, place, and time.  Skin: Skin is warm and dry.  Psychiatric: She has a normal mood and affect. Her behavior is normal.    Results for orders placed in visit on 01/19/13  POCT WET PREP WITH KOH      Result Value Range   Trichomonas, UA Negative     Clue Cells Wet Prep HPF POC tntc     Epithelial Wet Prep HPF POC neg     Yeast Wet Prep HPF POC neg     Bacteria Wet Prep HPF POC 3+     RBC Wet Prep HPF POC neg     WBC Wet Prep HPF POC 43-44     KOH Prep POC Negative         Assessment & Plan:  Bacterial vaginosis - Plan: metroNIDAZOLE (FLAGYL) 250 MG tablet  Vaginal discharge in pregnancy in first trimester - Plan: POCT Wet Prep with KOH, GC/Chlamydia Probe Amp  Nausea with vomiting - Plan: ondansetron (ZOFRAN-ODT) 8 MG disintegrating  tablet   [name] is a pleasant 37 yr old female here with bacterial vaginosis.  Pt is pregnant - 5 wks 6 days by LMP.  Poor obstetric history.  She reports that she is not continuing with the pregnancy.  Given that she is in her first trimester will treat with Flagyl 250mg  PO TID.  Pt with significant nausea and vomiting.  Will start Zofran ODT, take 20-30 minutes prior to flagyl dose to hopefully reduce stomach upset.  Encouraged frequent, small meals to hopefully reduce nausea.  Encouraged fluid intake even if just small sips, sucking on ice cubes, etc.  Pt to RTC if worsening or not improving.  Meds ordered this encounter    Medications  . ondansetron (ZOFRAN-ODT) 8 MG disintegrating tablet    Sig: Take 1 tablet (8 mg total) by mouth every 8 (eight) hours as needed for nausea.    Dispense:  30 tablet    Refill:  0    Order Specific Question:  Supervising Provider    Answer:  Ethelda Chick [[id number]]  . metroNIDAZOLE (FLAGYL) 250 MG tablet    Sig: Take 1 tablet (250 mg total) by mouth 3 (three) times daily.    Dispense:  21 tablet    Refill:  0    Order Specific Question:  Supervising Provider    Answer:  Ethelda Chick [[id number]]    Loleta Dicker MHS, PA-C [facility] 11/19/201410:51 AM

## 2013-01-19 NOTE — Patient Instructions (Signed)
Take the metronidazole as directed (every 8 hours.)  Be sure to finish the full course.  Use the Zofran every 8 hours as needed (take before metronidazole to reduce stomach upset.)  Please let us know if any symptoms are worsening or not improving.   Bacterial Vaginosis Bacterial vaginosis (BV) is a vaginal infection where the normal balance of bacteria in the vagina is disrupted. The normal balance is then replaced by an overgrowth of certain bacteria. There are several different kinds of bacteria that can cause BV. BV is the most common vaginal infection in women of childbearing age. CAUSES   The cause of BV is not fully understood. BV develops when there is an increase or imbalance of harmful bacteria.  Some activities or behaviors can upset the normal balance of bacteria in the vagina and put women at increased risk including:  Having a new sex partner or multiple sex partners.  Douching.  Using an intrauterine device (IUD) for contraception.  It is not clear what role sexual activity plays in the development of BV. However, women that have never had sexual intercourse are rarely infected with BV. Women do not get BV from toilet seats, bedding, swimming pools or from touching objects around them.  SYMPTOMS   Grey vaginal discharge.  A fish-like odor with discharge, especially after sexual intercourse.  Itching or burning of the vagina and vulva.  Burning or pain with urination.  Some women have no signs or symptoms at all. DIAGNOSIS  Your caregiver must examine the vagina for signs of BV. Your caregiver will perform lab tests and look at the sample of vaginal fluid through a microscope. They will look for bacteria and abnormal cells (clue cells), a pH test higher than 4.5, and a positive amine test all associated with BV.  RISKS AND COMPLICATIONS   Pelvic inflammatory disease (PID).  Infections following gynecology surgery.  Developing HIV.  Developing herpes  virus. TREATMENT  Sometimes BV will clear up without treatment. However, all women with symptoms of BV should be treated to avoid complications, especially if gynecology surgery is planned. Female partners generally do not need to be treated. However, BV may spread between female sex partners so treatment is helpful in preventing a recurrence of BV.   BV may be treated with antibiotics. The antibiotics come in either pill or vaginal cream forms. Either can be used with nonpregnant or pregnant women, but the recommended dosages differ. These antibiotics are not harmful to the baby.  BV can recur after treatment. If this happens, a second round of antibiotics will often be prescribed.  Treatment is important for pregnant women. If not treated, BV can cause a premature delivery, especially for a pregnant woman who had a premature birth in the past. All pregnant women who have symptoms of BV should be checked and treated.  For chronic reoccurrence of BV, treatment with a type of prescribed gel vaginally twice a week is helpful. HOME CARE INSTRUCTIONS   Finish all medication as directed by your caregiver.  Do not have sex until treatment is completed.  Tell your sexual partner that you have a vaginal infection. They should see their caregiver and be treated if they have problems, such as a mild rash or itching.  Practice safe sex. Use condoms. Only have 1 sex partner. PREVENTION  Basic prevention steps can help reduce the risk of upsetting the natural balance of bacteria in the vagina and developing BV:  Do not have sexual intercourse (be abstinent).  Do not douche.  Use all of the medicine prescribed for treatment of BV, even if the signs and symptoms go away.  Tell your sex partner if you have BV. That way, they can be treated, if needed, to prevent reoccurrence. SEEK MEDICAL CARE IF:   Your symptoms are not improving after 3 days of treatment.  You have increased discharge, pain, or  fever. MAKE SURE YOU:   Understand these instructions.  Will watch your condition.  Will get help right away if you are not doing well or get worse. FOR MORE INFORMATION  Division of STD Prevention (DSTDP), Centers for Disease Control and Prevention: [employer] American Social Health Association (ASHA): www.ashastd.org  Document Released: 02/18/2005 Document Revised: 05/13/2011 Document Reviewed: 09/30/2012 [facility] Patient Information 2014 [place], [place].

## 2013-02-18 DIAGNOSIS — O99340 Other mental disorders complicating pregnancy, unspecified trimester: Secondary | ICD-10-CM | POA: Insufficient documentation

## 2013-02-18 DIAGNOSIS — O034 Incomplete spontaneous abortion without complication: Secondary | ICD-10-CM | POA: Insufficient documentation

## 2013-02-18 DIAGNOSIS — F329 Major depressive disorder, single episode, unspecified: Secondary | ICD-10-CM | POA: Insufficient documentation

## 2013-02-18 DIAGNOSIS — F3289 Other specified depressive episodes: Secondary | ICD-10-CM | POA: Insufficient documentation

## 2013-02-18 DIAGNOSIS — J45909 Unspecified asthma, uncomplicated: Secondary | ICD-10-CM | POA: Insufficient documentation

## 2013-02-18 DIAGNOSIS — F411 Generalized anxiety disorder: Secondary | ICD-10-CM | POA: Insufficient documentation

## 2013-02-18 DIAGNOSIS — Z79899 Other long term (current) drug therapy: Secondary | ICD-10-CM | POA: Insufficient documentation

## 2013-02-18 LAB — CBC
HCT: 36.5 % (ref 36.0–46.0)
MCV: 85.9 fL (ref 78.0–100.0)
RBC: 4.25 MIL/uL (ref 3.87–5.11)
WBC: 5.1 10*3/uL (ref 4.0–10.5)

## 2013-02-18 NOTE — ED Notes (Signed)
Presents post 1st trimester abortion on 12/6, began having heavy vaginal bleeding this evening at 10 pm associated with vaginal pain. Going through 2 depends in one hour. +clots, unsure of how large the clots are.

## 2013-02-19 ENCOUNTER — Emergency Department (HOSPITAL_COMMUNITY): Payer: BC Managed Care – PPO

## 2013-02-19 ENCOUNTER — Emergency Department (HOSPITAL_COMMUNITY)
Admission: EM | Admit: 2013-02-19 | Discharge: 2013-02-19 | Disposition: A | Discharge status: Home / Self Care | Payer: BC Managed Care – PPO | Attending: Emergency Medicine | Admitting: Emergency Medicine

## 2013-02-19 DIAGNOSIS — O034 Incomplete spontaneous abortion without complication: Secondary | ICD-10-CM

## 2013-02-19 LAB — HCG, QUANTITATIVE, PREGNANCY: hCG, Beta Chain, Quant, S: 170 m[IU]/mL — ABNORMAL HIGH (ref <5)

## 2013-02-19 MED ORDER — METHYLERGONOVINE MALEATE 0.2 MG/ML IJ SOLN
0.2000 mg | Freq: Once | INTRAMUSCULAR | Status: AC
  Administered 2013-02-19: 0.2 mg via INTRAMUSCULAR
  Filled 2013-02-19: qty 1

## 2013-02-19 MED ORDER — METHYLERGONOVINE MALEATE 0.2 MG PO TABS: 0.2000 mg | ORAL_TABLET | Freq: Three times a day (TID) | ORAL | Status: DC

## 2013-02-19 NOTE — ED Provider Notes (Signed)
[name]     Arrival date & time 02/18/13  2243 History   First MD Initiated Contact with Patient 02/19/13 0020     Chief Complaint  Patient presents with  . Vaginal Bleeding   (Consider location/radiation/quality/duration/timing/severity/associated sxs/prior Treatment) HPI G6P1 recent elective Ab 06Dec had spotting few days afterwards then stopped, recurrent bleeding heavy tonight to ED no pain or fever or discharge also no syncope. Pt able to walk unassisted.  Pt states Opos blood type OB/Gyn Varney Baas Past Medical History  Diagnosis Date  . Allergy   . Sleep apnea   . Anxiety   . Depression   . Asthma    Past Surgical History  Procedure Laterality Date  . Mouth surgery  1999   Family History  Problem Relation Age of Onset  . HIV Mother   . Heart disease Maternal Grandfather   . Diabetes Paternal Grandmother   . Cancer Paternal Grandfather    History  Substance Use Topics  . Smoking status: Never Smoker   . Smokeless tobacco: Not on file  . Alcohol Use: Yes     Comment: occasional   OB History   Grav Para Term Preterm Abortions TAB SAB Ect Mult Living                 Review of Systems 10 Systems reviewed and are negative for acute change except as noted in the HPI. Allergies  Stadol and Kale  Home Medications   Current Outpatient Rx  Name  Route  Sig  Dispense  Refill  . citalopram (CELEXA) 20 MG tablet   Oral   Take 20 mg by mouth daily.         . traZODone (DESYREL) 50 MG tablet   Oral   Take 25 mg by mouth at bedtime as needed for sleep.         . methylergonovine (METHERGINE) 0.2 MG tablet   Oral   Take 1 tablet (0.2 mg total) by mouth 3 (three) times daily.   9 tablet   0    BP 109/71  Pulse 61  SpO2 99% Physical Exam  Nursing note and vitals reviewed. Constitutional:  Awake, alert, nontoxic appearance.  HENT:  Head: Atraumatic.  Eyes: Right eye exhibits no discharge. Left eye exhibits no discharge.  Neck: Neck supple.   Cardiovascular: Normal rate and regular rhythm.   No murmur heard. Pulmonary/Chest: Effort normal and breath sounds normal. No respiratory distress. She has no wheezes. She has no rales. She exhibits no tenderness.  Abdominal: Soft. Bowel sounds are normal. She exhibits no distension and no mass. There is no tenderness. There is no rebound and no guarding.  Musculoskeletal: She exhibits no tenderness.  Baseline ROM, no obvious new focal weakness.  Neurological: She is alert.  Mental status and motor strength appears baseline for patient and situation.  Skin: No rash noted.  Psychiatric: She has a normal mood and affect.    ED Course  Procedures (including critical care time) Chaperone present for pelvic examination vaginal vault filled with dark blood clots and dark blood no active bleeding noted externally is open internal os seems closed no cervical motion tenderness no adnexal tenderness  D/w Gyn rec start methergine Pt to call her Gyn clinic to have procedure completed since she doesn't want to go back to [facility] abortion clinic. Patient informed of clinical course, understand medical decision-making process, and agree with plan. Labs Review Labs Reviewed  HCG, QUANTITATIVE, PREGNANCY - Abnormal; Notable  for the following:    hCG, Beta Chain, Quant, S 170 (*)    All other components within normal limits  CBC   Imaging Review US Transvaginal Non-ob  02/19/2013   CLINICAL DATA:  Vaginal bleeding. Recent abortion. Evaluate for retained products.  EXAM: TRANSABDOMINAL AND TRANSVAGINAL ULTRASOUND OF PELVIS  TECHNIQUE: Both transabdominal and transvaginal ultrasound examinations of the pelvis were performed. Transabdominal technique was performed for global imaging of the pelvis including uterus, ovaries, adnexal regions, and pelvic cul-de-sac. It was necessary to proceed with endovaginal exam following the transabdominal exam to visualize the endometrium.  COMPARISON:  08/16/2009   FINDINGS: Uterus  Measurements: 10 x 4 x 6 cm. No fibroids or other mass visualized.  Endometrium  Thickness: There is focal masslike thickening of the uterine fundus to 2 cm. Although the majority of the heterogeneous material is nonvascular, at the posterior upper margin, there is color Doppler flow to some of the material.  Right ovary  Measurements: 4 x 2.5 x 2.3 cm. Normal appearance/no adnexal mass.  Left ovary  Measurements: 3 x 1.5 x 1.8 cm. Normal appearance/no adnexal mass.  Other findings  No significant free fluid.  IMPRESSION: Hemorrhage/ debris and vascularized tissue in the fundic endometrial cavity is consistent with retained products of conception.   Electronically Signed   By: Tiburcio Pea M.D.   On: 02/19/2013 02:21   US Pelvis Complete  02/19/2013   CLINICAL DATA:  Vaginal bleeding. Recent abortion. Evaluate for retained products.  EXAM: TRANSABDOMINAL AND TRANSVAGINAL ULTRASOUND OF PELVIS  TECHNIQUE: Both transabdominal and transvaginal ultrasound examinations of the pelvis were performed. Transabdominal technique was performed for global imaging of the pelvis including uterus, ovaries, adnexal regions, and pelvic cul-de-sac. It was necessary to proceed with endovaginal exam following the transabdominal exam to visualize the endometrium.  COMPARISON:  08/16/2009  FINDINGS: Uterus  Measurements: 10 x 4 x 6 cm. No fibroids or other mass visualized.  Endometrium  Thickness: There is focal masslike thickening of the uterine fundus to 2 cm. Although the majority of the heterogeneous material is nonvascular, at the posterior upper margin, there is color Doppler flow to some of the material.  Right ovary  Measurements: 4 x 2.5 x 2.3 cm. Normal appearance/no adnexal mass.  Left ovary  Measurements: 3 x 1.5 x 1.8 cm. Normal appearance/no adnexal mass.  Other findings  No significant free fluid.  IMPRESSION: Hemorrhage/ debris and vascularized tissue in the fundic endometrial cavity is consistent  with retained products of conception.   Electronically Signed   By: Tiburcio Pea M.D.   On: 02/19/2013 02:21    EKG Interpretation   None       MDM   1. Incomplete abortion    I doubt any other EMC precluding discharge at this time including, but not necessarily limited to the following:septic abortion.    Hurman Horn, MD 02/19/13 [phone]

## 2013-03-04 HISTORY — PX: INDUCED ABORTION: SHX677

## 2013-03-09 DIAGNOSIS — IMO0001 Reserved for inherently not codable concepts without codable children: Secondary | ICD-10-CM

## 2013-03-09 DIAGNOSIS — O039 Complete or unspecified spontaneous abortion without complication: Secondary | ICD-10-CM

## 2013-03-09 HISTORY — DX: Complete or unspecified spontaneous abortion without complication: O03.9

## 2013-03-09 HISTORY — DX: Reserved for inherently not codable concepts without codable children: IMO0001

## 2013-03-18 ENCOUNTER — Ambulatory Visit (INDEPENDENT_AMBULATORY_CARE_PROVIDER_SITE_OTHER): Payer: BC Managed Care – PPO | Admitting: Physician Assistant

## 2013-03-18 VITALS — BP 122/74 | HR 74 | Temp 99.3°F | Resp 18 | Ht 65.5 in | Wt 197.0 lb

## 2013-03-18 DIAGNOSIS — G43909 Migraine, unspecified, not intractable, without status migrainosus: Secondary | ICD-10-CM | POA: Insufficient documentation

## 2013-03-18 DIAGNOSIS — Z9989 Dependence on other enabling machines and devices: Secondary | ICD-10-CM

## 2013-03-18 DIAGNOSIS — G4733 Obstructive sleep apnea (adult) (pediatric): Secondary | ICD-10-CM | POA: Insufficient documentation

## 2013-03-18 DIAGNOSIS — R300 Dysuria: Secondary | ICD-10-CM

## 2013-03-18 DIAGNOSIS — R8290 Unspecified abnormal findings in urine: Secondary | ICD-10-CM

## 2013-03-18 DIAGNOSIS — R309 Painful micturition, unspecified: Secondary | ICD-10-CM

## 2013-03-18 DIAGNOSIS — R350 Frequency of micturition: Secondary | ICD-10-CM

## 2013-03-18 DIAGNOSIS — R82998 Other abnormal findings in urine: Secondary | ICD-10-CM

## 2013-03-18 LAB — POCT URINALYSIS DIPSTICK
BILIRUBIN UA: NEGATIVE
Glucose, UA: NEGATIVE
KETONES UA: NEGATIVE
Nitrite, UA: NEGATIVE
Protein, UA: 100
SPEC GRAV UA: 1.02
Urobilinogen, UA: 0.2
pH, UA: 8

## 2013-03-18 LAB — POCT UA - MICROSCOPIC ONLY
Casts, Ur, LPF, POC: NEGATIVE
Crystals, Ur, HPF, POC: NEGATIVE
MUCUS UA: NEGATIVE
Yeast, UA: NEGATIVE

## 2013-03-18 MED ORDER — SULFAMETHOXAZOLE-TRIMETHOPRIM 800-160 MG PO TABS: 1.0000 | ORAL_TABLET | Freq: Two times a day (BID) | ORAL | Status: DC

## 2013-03-18 MED ORDER — PHENAZOPYRIDINE HCL 200 MG PO TABS: 200.0000 mg | ORAL_TABLET | Freq: Three times a day (TID) | ORAL | Status: DC | PRN

## 2013-03-18 NOTE — Patient Instructions (Signed)
Get plenty of rest and drink at least 64 ounces of water daily. Urinary Tract Infection Urinary tract infections (UTIs) can develop anywhere along your urinary tract. Your urinary tract is your body's drainage system for removing wastes and extra water. Your urinary tract includes two kidneys, two ureters, a bladder, and a urethra. Your kidneys are a pair of bean-shaped organs. Each kidney is about the size of your fist. They are located below your ribs, one on each side of your spine. CAUSES Infections are caused by microbes, which are microscopic organisms, including fungi, viruses, and bacteria. These organisms are so small that they can only be seen through a microscope. Bacteria are the microbes that most commonly cause UTIs. SYMPTOMS  Symptoms of UTIs may vary by age and gender of the patient and by the location of the infection. Symptoms in young women typically include a frequent and intense urge to urinate and a painful, burning feeling in the bladder or urethra during urination. Older women and men are more likely to be tired, shaky, and weak and have muscle aches and abdominal pain. A fever may mean the infection is in your kidneys. Other symptoms of a kidney infection include pain in your back or sides below the ribs, nausea, and vomiting. DIAGNOSIS To diagnose a UTI, your caregiver will ask you about your symptoms. Your caregiver also will ask to provide a urine sample. The urine sample will be tested for bacteria and white blood cells. White blood cells are made by your body to help fight infection. TREATMENT  Typically, UTIs can be treated with medication. Because most UTIs are caused by a bacterial infection, they usually can be treated with the use of antibiotics. The choice of antibiotic and length of treatment depend on your symptoms and the type of bacteria causing your infection. HOME CARE INSTRUCTIONS  If you were prescribed antibiotics, take them exactly as your caregiver  instructs you. Finish the medication even if you feel better after you have only taken some of the medication.  Drink enough water and fluids to keep your urine clear or pale yellow.  Avoid caffeine, tea, and carbonated beverages. They tend to irritate your bladder.  Empty your bladder often. Avoid holding urine for long periods of time.  Empty your bladder before and after sexual intercourse.  After a bowel movement, women should cleanse from front to back. Use each tissue only once. SEEK MEDICAL CARE IF:   You have back pain.  You develop a fever.  Your symptoms do not begin to resolve within 3 days. SEEK IMMEDIATE MEDICAL CARE IF:   You have severe back pain or lower abdominal pain.  You develop chills.  You have nausea or vomiting.  You have continued burning or discomfort with urination. MAKE SURE YOU:   Understand these instructions.  Will watch your condition.  Will get help right away if you are not doing well or get worse. Document Released: 11/28/2004 Document Revised: 08/20/2011 Document Reviewed: 03/29/2011 [facility] Patient Information 2014 [employer], [place].

## 2013-03-18 NOTE — Progress Notes (Signed)
Subjective:    Patient ID: Morgan [name], female    DOB: 04/08/1975, 38 y.o.   MRN: [id number]  PCP: Nilda SimmerSMITH,KRISTI, MD  Chief Complaint  Patient presents with  . Urinary Tract Infection  . Urinary Frequency    x2 days   . odor  . Dysuria   Medications, allergies, past medical history, surgical history, family history, social history and problem list reviewed and updated.  HPI  Cloudiness of urine. No hematuria. No unusual vaginal discharge. No fever, chills, nausea, vomiting, diarrhea. No back or abdominal pain.  Monogamous sex with her husband.    Review of Systems As above. She recently terminated an unplanned pregnancy, and had complications, reporting that it wasn't complete.  She had significant bleeding and is now on iron supplementation, which is causing constipation.  Last BM was several days ago.     Objective:   Physical Exam  Constitutional: She is oriented to person, place, and time. Vital signs are normal. She appears well-developed and well-nourished. No distress.  HENT:  Head: Normocephalic and atraumatic.  Cardiovascular: Normal rate, regular rhythm and normal heart sounds.   Pulmonary/Chest: Effort normal and breath sounds normal.  Abdominal: Soft. Normal appearance and bowel sounds are normal. She exhibits no distension and no mass. There is no hepatosplenomegaly. There is tenderness in the suprapubic area and left lower quadrant. There is no rigidity, no rebound, no guarding, no CVA tenderness, no tenderness at McBurney's point and negative Murphy's sign. No hernia.  Musculoskeletal: Normal range of motion.       Lumbar back: Normal.  Neurological: She is alert and oriented to person, place, and time.  Skin: Skin is warm and dry. No rash noted. She is not diaphoretic. No pallor.  Psychiatric: She has a normal mood and affect. Her speech is normal and behavior is normal. Judgment normal.     Results for orders placed in visit on 03/18/13  POCT  URINALYSIS DIPSTICK      Result Value Range   Color, UA yellow     Clarity, UA cloudy     Glucose, UA neg     Bilirubin, UA neg     Ketones, UA neg     Spec Grav, UA 1.020     Blood, UA small     pH, UA 8.0     Protein, UA 100     Urobilinogen, UA 0.2     Nitrite, UA neg     Leukocytes, UA large (3+)    POCT UA - MICROSCOPIC ONLY      Result Value Range   WBC, Ur, HPF, POC TNTC     RBC, urine, microscopic 3-5     Bacteria, U Microscopic 1+     Mucus, UA neg     Epithelial cells, urine per micros 0-1     Crystals, Ur, HPF, POC neg     Casts, Ur, LPF, POC neg     Yeast, UA neg          Assessment & Plan:  1. Frequent urination 2. Pain with urination 3. Abnormal urine odor Anticipatory guidance. Supportive care. RTC if symptoms worsen/persist. - POCT urinalysis dipstick - POCT UA - Microscopic Only - sulfamethoxazole-trimethoprim (BACTRIM DS,SEPTRA DS) 800-160 MG per tablet; Take 1 tablet by mouth 2 (two) times daily.  Dispense: 10 tablet; Refill: 0 - phenazopyridine (PYRIDIUM) 200 MG tablet; Take 1 tablet (200 mg total) by mouth 3 (three) times daily as needed for pain.  Dispense: 10 tablet; Refill:  0 - Urine culture   Fernande Bras, PA-C Physician Assistant-Certified [facility]

## 2013-03-20 LAB — URINE CULTURE: Colony Count: >=100000

## 2013-05-31 ENCOUNTER — Ambulatory Visit (INDEPENDENT_AMBULATORY_CARE_PROVIDER_SITE_OTHER): Payer: BC Managed Care – PPO | Admitting: Internal Medicine

## 2013-05-31 VITALS — BP 122/84 | HR 59 | Temp 98.9°F | Resp 18 | Ht 65.0 in | Wt 201.0 lb

## 2013-05-31 DIAGNOSIS — R21 Rash and other nonspecific skin eruption: Secondary | ICD-10-CM

## 2013-05-31 LAB — POCT SKIN KOH: SKIN KOH, POC: NEGATIVE

## 2013-05-31 MED ORDER — KETOCONAZOLE 2 % EX CREA: 1.0000 "application " | TOPICAL_CREAM | Freq: Two times a day (BID) | CUTANEOUS | Status: DC

## 2013-05-31 NOTE — Progress Notes (Signed)
   Subjective:    Patient ID: Morgan [name], female    DOB: 07/11/1975, 38 y.o.   MRN: [id number]  HPI  Chief Complaint  Patient presents with  . Rash    Right arm, Has rash like places that have grown over time, does itch, x1 week   started with a lesion on the right upper arm which was small but spread and a concentric circle and then a new lesion on the right lower arm during the same time has tried Monistat and even 1 dose of Diflucan without resolution No known exposures   Review of Systems noncontr    Objective:   Physical Exam BP 122/84  Pulse 59  Temp(Src) 98.9 F (37.2 C) (Oral)  Resp 18  Ht 5\' 5"  (1.651 m)  Wt 201 lb (91.173 kg)  BMI 33.45 kg/m2  SpO2 99%  LMP 05/24/2013 Of the right upper arm there was a 3 cm circular lesion with clearing centrally and scaliness at the border Right lower arm has a 1 cm circular lesion that is crusty Scant flakes on scraping      koh=neg Assessment & Plan:  Tinea corporis Meds ordered this encounter  Medications  . ketoconazole (NIZORAL) 2 % cream    Sig: Apply 1 application topically 2 (two) times daily. For 1 month    Dispense:  15 g    Refill:  0

## 2013-06-18 ENCOUNTER — Ambulatory Visit (INDEPENDENT_AMBULATORY_CARE_PROVIDER_SITE_OTHER): Payer: BC Managed Care – PPO | Admitting: Family Medicine

## 2013-06-18 VITALS — BP 116/68 | HR 64 | Temp 98.2°F | Resp 16 | Ht 65.0 in | Wt 202.6 lb

## 2013-06-18 DIAGNOSIS — N3090 Cystitis, unspecified without hematuria: Secondary | ICD-10-CM

## 2013-06-18 DIAGNOSIS — R21 Rash and other nonspecific skin eruption: Secondary | ICD-10-CM

## 2013-06-18 DIAGNOSIS — R300 Dysuria: Secondary | ICD-10-CM

## 2013-06-18 LAB — POCT UA - MICROSCOPIC ONLY
Casts, Ur, LPF, POC: NEGATIVE
Crystals, Ur, HPF, POC: NEGATIVE
Mucus, UA: NEGATIVE
Yeast, UA: NEGATIVE

## 2013-06-18 LAB — POCT RAPID STREP A (OFFICE): Rapid Strep A Screen: NEGATIVE

## 2013-06-18 LAB — POCT URINALYSIS DIPSTICK
BILIRUBIN UA: NEGATIVE
GLUCOSE UA: NEGATIVE
Ketones, UA: NEGATIVE
Nitrite, UA: POSITIVE
PH UA: 6.5
Spec Grav, UA: 1.025
Urobilinogen, UA: 0.2

## 2013-06-18 MED ORDER — SULFAMETHOXAZOLE-TRIMETHOPRIM 800-160 MG PO TABS: 1.0000 | ORAL_TABLET | Freq: Two times a day (BID) | ORAL | Status: DC

## 2013-06-18 MED ORDER — TRIAMCINOLONE ACETONIDE 0.1 % EX CREA: 1.0000 "application " | TOPICAL_CREAM | Freq: Two times a day (BID) | CUTANEOUS | Status: DC

## 2013-06-18 NOTE — Progress Notes (Signed)
   Subjective:    Patient ID: Morgan [name], female    DOB: 04/30/1975, 38 y.o.   MRN: [id number]  HPI Patient presents today with worsening of rash. Was treated with nizoral cream for about 3 weeks. Noticed some improvement of first lesion on right upper arm. Now has multiple lesion on bilateral arms, and legs. These are very itchy. They do not blister or drain. Patient denies recent pharyngitis.  Patient reports urinary frequency and burning for 5 days. Unable to tell if she is having hematuria, currently menstrating. No back pain, no fever, no chills. Has occasional UTI, thinks this may be related to not voiding after intercourse.   Review of Systems No abdominal pain or vomiting. No fever or chills.    Objective:   Physical Exam  Vitals reviewed. Constitutional: She is oriented to person, place, and time. She appears well-developed and well-nourished.  HENT:  Head: Normocephalic and atraumatic.  Eyes: Conjunctivae are normal.  Cardiovascular: Normal rate and regular rhythm.   Pulmonary/Chest: Effort normal and breath sounds normal.  Abdominal: Soft. Bowel sounds are normal. She exhibits no distension and no mass. There is no tenderness. There is no rebound, no guarding and no CVA tenderness.  Musculoskeletal: Normal range of motion.  Neurological: She is alert and oriented to person, place, and time.  Skin: Skin is warm and dry. Rash noted.  Patient with right upper, medial area discoloration. Area approximately 4 cm, round, border darker than center. No scaling. Scattered lesions on arms and legs of 0.5-1.0 cm, round areas, darkened. Slightly raised, no scaling.       Assessment & Plan:  1. Dysuria - POCT urinalysis dipstick - POCT UA - Microscopic Only  2. Rash and nonspecific skin eruption - Questionable [place] Psoriasis - POCT rapid strep A - triamcinolone cream (KENALOG) 0.1 %; Apply 1 application topically 2 (two) times daily.  Dispense: 30 g; Refill: 0 - Culture,  Group A Strep -Follow up if no improvement.  3. Cystitis - sulfamethoxazole-trimethoprim (BACTRIM DS,SEPTRA DS) 800-160 MG per tablet; Take 1 tablet by mouth 2 (two) times daily.  Dispense: 10 tablet; Refill: 0   Emi Belfasteborah B. Damontae Loppnow, FNP-BC  [facility]  06/18/2013 2:59 PM   Patient discussed with Ms. Leone PayorGessner, NP and agree with above.   Eula Listenyan Dunn, MHS, PA-C [facility] [address] [place], [place] [address] [phone] [facility] 06/21/2013 1:47 PM

## 2013-06-20 LAB — CULTURE, GROUP A STREP: Organism ID, Bacteria: NORMAL

## 2013-06-30 ENCOUNTER — Telehealth: Payer: Self-pay

## 2013-06-30 MED ORDER — FLUCONAZOLE 150 MG PO TABS: ORAL_TABLET | ORAL | Status: DC

## 2013-06-30 NOTE — Telephone Encounter (Signed)
PT STATES SHE WAS GIVEN SOME ANTIBIOTICS AND NOW HAVE A YEAST INFECTION. WOULD LIKE TO HAVE A DIFLUCAN CALLED IN FOR HER PLEASE CALL [phone]    [facility] [address]

## 2013-06-30 NOTE — Telephone Encounter (Signed)
Diflucan sent in but unable to notify pt because VM is not set up

## 2013-07-05 ENCOUNTER — Ambulatory Visit (INDEPENDENT_AMBULATORY_CARE_PROVIDER_SITE_OTHER): Payer: BC Managed Care – PPO | Admitting: Physician Assistant

## 2013-07-05 VITALS — BP 104/72 | HR 75 | Temp 98.1°F | Resp 16 | Ht 63.75 in | Wt 204.4 lb

## 2013-07-05 DIAGNOSIS — H109 Unspecified conjunctivitis: Secondary | ICD-10-CM

## 2013-07-05 MED ORDER — POLYMYXIN B-TRIMETHOPRIM 10000-0.1 UNIT/ML-% OP SOLN: 1.0000 [drp] | OPHTHALMIC | Status: DC

## 2013-07-05 NOTE — Patient Instructions (Signed)
Use the Polytrim drops every 4 hours while awake for the next 7-10 days  Make sure you are washing your hands frequently!  Try not to touch or rub your eyes  Cool compresses may offer some relief  If any symptoms are worsening or not improving, please let us know   Conjunctivitis Conjunctivitis is commonly called "pink eye." Conjunctivitis can be caused by bacterial or viral infection, allergies, or injuries. There is usually redness of the lining of the eye, itching, discomfort, and sometimes discharge. There may be deposits of matter along the eyelids. A viral infection usually causes a watery discharge, while a bacterial infection causes a yellowish, thick discharge. Pink eye is very contagious and spreads by direct contact. You may be given antibiotic eyedrops as part of your treatment. Before using your eye medicine, remove all drainage from the eye by washing gently with warm water and cotton balls. Continue to use the medication until you have awakened 2 mornings in a row without discharge from the eye. Do not rub your eye. This increases the irritation and helps spread infection. Use separate towels from other household members. Wash your hands with soap and water before and after touching your eyes. Use cold compresses to reduce pain and sunglasses to relieve irritation from light. Do not wear contact lenses or wear eye makeup until the infection is gone. SEEK MEDICAL CARE IF:   Your symptoms are not better after 3 days of treatment.  You have increased pain or trouble seeing.  The outer eyelids become very red or swollen. Document Released: 03/28/2004 Document Revised: 05/13/2011 Document Reviewed: 02/18/2005 [facility] Patient Information 2014 [place], [place].

## 2013-07-05 NOTE — Progress Notes (Signed)
   Subjective:    Patient ID: Glade StanfordMonique Price-Cole, female    DOB: 04/09/1975, 38 y.o.   MRN: [id number]  HPI   Ms. Doristine Bosworthrice-Cole is a very pleasant 38 yr old female here with concern for conjunctivitis.  Both eyes were itching yesterday.  Woke up at 2am with eyes crusted shut.  No eye pain - mostly itchy feeling.  She did flush her eyes out with no relief.  No known FB to the eyes.  No FB sensation.  Took some claritin thinking possibly allergic - no relief.  +discharge from eyes.  Vision slightly blurry due to discharge.  HA yesterday but not today.  No NV.  No sick contacts.  Has been sneezing but no other URI symptoms.  No contact lenses.    Review of Systems  Constitutional: Negative for fever and chills.  HENT: Positive for sneezing. Negative for congestion and rhinorrhea.   Eyes: Positive for discharge, redness and itching. Negative for photophobia and pain.  Respiratory: Negative for cough.   Cardiovascular: Negative.   Gastrointestinal: Negative.   Neurological: Negative for dizziness, light-headedness and headaches.       Objective:   Physical Exam  Vitals reviewed. Constitutional: She is oriented to person, place, and time. She appears well-developed and well-nourished. No distress.  HENT:  Head: Normocephalic and atraumatic.  Eyes: EOM and lids are normal. Pupils are equal, round, and reactive to light. Right conjunctiva is injected. Left conjunctiva is injected.    Small amount of crusted purulent drainage at lash margins and corners of eyes  Cardiovascular: Normal rate, regular rhythm and normal heart sounds.   Pulmonary/Chest: Effort normal and breath sounds normal. She has no wheezes. She has no rales.  Lymphadenopathy:    She has no cervical adenopathy.  Neurological: She is alert and oriented to person, place, and time.  Skin: Skin is warm and dry.  Psychiatric: She has a normal mood and affect. Her behavior is normal.        Assessment & Plan:  Conjunctivitis -  Plan: trimethoprim-polymyxin b (POLYTRIM) ophthalmic solution   Ms. Doristine Bosworthrice-Cole is a very pleasant 38 yr old female here with conjunctivitis - viral vs bacterial.  There is a small amount of purulent drainage at both lid margins.  Will be cautious and treat with antibiotic drops.  Cool compresses for symptoms relief.  Frequent hand washing to prevent spread of infection  Pt to call or RTC if worsening or not improving  E. Frances FurbishElizabeth Elchonon Maxson MHS, PA-C [facility] 5/4/20155:24 PM

## 2013-07-16 ENCOUNTER — Ambulatory Visit (INDEPENDENT_AMBULATORY_CARE_PROVIDER_SITE_OTHER): Payer: BC Managed Care – PPO | Admitting: Internal Medicine

## 2013-07-16 VITALS — BP 118/70 | HR 62 | Temp 98.7°F | Resp 16 | Ht 65.0 in | Wt 204.8 lb

## 2013-07-16 DIAGNOSIS — R591 Generalized enlarged lymph nodes: Secondary | ICD-10-CM

## 2013-07-16 DIAGNOSIS — R599 Enlarged lymph nodes, unspecified: Secondary | ICD-10-CM

## 2013-07-16 LAB — POCT CBC
Granulocyte percent: 44.1 %G (ref 37–80)
HCT, POC: 40.4 % (ref 37.7–47.9)
Hemoglobin: 12.2 g/dL (ref 12.2–16.2)
Lymph, poc: 2.6 (ref 0.6–3.4)
MCH, POC: 25.5 pg — AB (ref 27–31.2)
MCHC: 30.2 g/dL — AB (ref 31.8–35.4)
MCV: 84.5 fL (ref 80–97)
MID (cbc): 0.5 (ref 0–0.9)
MPV: 9 fL (ref 0–99.8)
POC Granulocyte: 2.4 (ref 2–6.9)
POC LYMPH %: 47.3 % (ref 10–50)
POC MID %: 8.6 %M (ref 0–12)
Platelet Count, POC: 309 10*3/uL (ref 142–424)
RBC: 4.78 M/uL (ref 4.04–5.48)
RDW, POC: 16.1 %
WBC: 5.4 10*3/uL (ref 4.6–10.2)

## 2013-07-16 MED ORDER — AMOXICILLIN 875 MG PO TABS: 875.0000 mg | ORAL_TABLET | Freq: Two times a day (BID) | ORAL | Status: DC

## 2013-07-16 NOTE — Progress Notes (Signed)
Subjective:    Patient ID: Morgan [name], female    DOB: 02/18/1976, 38 y.o.   MRN: [id number] This chart was scribed for Ellamae Siaobert Mayerly Kaman, MD by Danella Maiersaroline Early, ED Scribe. This patient was seen in room 11 and the patient's care was started at 4:50 PM.  Chief Complaint  Patient presents with  . Adenopathy    today woke up with neck swollen on the lt side    HPI HPI Comments: Morgan [name] is a 38 y.o. female who presents to the Emergency Department complaining of constant left-sided neck pain with associated swelling since she woke up this morning. She states it feels like she "swallowed a golf ball". Pain worsens with palpation. She reports mild pain with swallowing. She denies dental pain and can bite down without pain.   She was seen here two weeks ago and treated for bilateral pink eye. She states it is better but she is still having some redness and mild crusting. She also reports rhinorrhea that she states is due to seasonal allergies.  PCP Nilda Simmer- SMITH,KRISTI, MD  Patient Active Problem List   Diagnosis Date Noted  . Migraine 03/18/2013  . OSA on CPAP 03/18/2013    Current Outpatient Prescriptions on File Prior to Visit  Medication Sig Dispense Refill  . citalopram (CELEXA) 20 MG tablet Take 20 mg by mouth daily.      . IRON PO Take by mouth.      . traZODone (DESYREL) 50 MG tablet Take 25 mg by mouth at bedtime as needed for sleep.      Marland Kitchen. triamcinolone cream (KENALOG) 0.1 % Apply 1 application topically 2 (two) times daily.  30 g  0  . trimethoprim-polymyxin b (POLYTRIM) ophthalmic solution Place 1 drop into both eyes every 4 (four) hours.  10 mL  0   No current facility-administered medications on file prior to visit.      Review of Systems  HENT: Positive for rhinorrhea.   Eyes: Positive for redness. Negative for pain and discharge.  Musculoskeletal: Positive for neck pain.       Objective:   Physical Exam  Nursing note and vitals  reviewed. Constitutional: She is oriented to person, place, and time. She appears well-developed and well-nourished. No distress.  HENT:  Head: Normocephalic and atraumatic.  Nose: Nose normal.  Mouth/Throat: Oropharynx is clear and moist. No oropharyngeal exudate.  Teeth are in good repair  Eyes: EOM are normal.  Neck: Neck supple.  salivary glands are not swollen. There is a 3cm x 3cm swollen tender left anterior cervical node. Thyroid gland is not palpable  Cardiovascular: Normal rate.   Pulmonary/Chest: Effort normal. No respiratory distress.  Musculoskeletal: Normal range of motion.  Neurological: She is alert and oriented to person, place, and time.  Skin: Skin is warm and dry.  Psychiatric: She has a normal mood and affect. Her behavior is normal.    Filed Vitals:   07/16/13 1608  BP: 118/70  Pulse: 62  Temp: 98.7 F (37.1 C)  TempSrc: Oral  Resp: 16  Height: 5\' 5"  (1.651 m)  Weight: 204 lb 12.8 oz (92.897 kg)  SpO2: 99%         Assessment & Plan:    I have completed the patient encounter in its entirety as documented by the scribe, with editing by me where necessary. Sebastion Jun P. Merla Richesoolittle, M.D. Adenopathy - Plan: POCT CBC  Meds ordered this encounter  Medications  . amoxicillin (AMOXIL) 875 MG tablet  Sig: Take 1 tablet (875 mg total) by mouth 2 (two) times daily.    Dispense:  20 tablet    Refill:  0   reck10 days

## 2013-07-19 ENCOUNTER — Telehealth: Payer: Self-pay

## 2013-07-19 DIAGNOSIS — R221 Localized swelling, mass and lump, neck: Secondary | ICD-10-CM

## 2013-07-19 NOTE — Telephone Encounter (Signed)
Tried to call patient.  Home number disconnected.  No answer on mobile number.

## 2013-07-19 NOTE — Telephone Encounter (Signed)
Pt is wanting to talk with dr Merla Richesdoolittle about going ahead and referring to an ent office

## 2013-07-20 NOTE — Telephone Encounter (Signed)
Absolutely Dx neck mass  To Dr Chris Newman Narda Bondsif available//Dr Haroldine Lawsrossley if not

## 2013-07-20 NOTE — Telephone Encounter (Signed)
Dr. Merla Richesoolittle is ok to go ahead and send in this referral?

## 2013-07-21 NOTE — Telephone Encounter (Signed)
Referral placed.

## 2013-08-19 ENCOUNTER — Ambulatory Visit (INDEPENDENT_AMBULATORY_CARE_PROVIDER_SITE_OTHER): Payer: BC Managed Care – PPO | Admitting: Family Medicine

## 2013-08-19 VITALS — BP 124/84 | HR 59 | Temp 98.4°F | Resp 18 | Ht 65.0 in | Wt 208.0 lb

## 2013-08-19 DIAGNOSIS — N898 Other specified noninflammatory disorders of vagina: Secondary | ICD-10-CM

## 2013-08-19 DIAGNOSIS — R309 Painful micturition, unspecified: Secondary | ICD-10-CM

## 2013-08-19 DIAGNOSIS — N3000 Acute cystitis without hematuria: Secondary | ICD-10-CM

## 2013-08-19 DIAGNOSIS — A499 Bacterial infection, unspecified: Secondary | ICD-10-CM

## 2013-08-19 DIAGNOSIS — R300 Dysuria: Secondary | ICD-10-CM

## 2013-08-19 DIAGNOSIS — N760 Acute vaginitis: Secondary | ICD-10-CM

## 2013-08-19 DIAGNOSIS — K644 Residual hemorrhoidal skin tags: Secondary | ICD-10-CM

## 2013-08-19 DIAGNOSIS — R350 Frequency of micturition: Secondary | ICD-10-CM

## 2013-08-19 DIAGNOSIS — B9689 Other specified bacterial agents as the cause of diseases classified elsewhere: Secondary | ICD-10-CM

## 2013-08-19 LAB — POCT WET PREP WITH KOH
Clue Cells Wet Prep HPF POC: 75
KOH Prep POC: NEGATIVE
RBC Wet Prep HPF POC: NEGATIVE
Trichomonas, UA: NEGATIVE
YEAST WET PREP PER HPF POC: NEGATIVE

## 2013-08-19 LAB — POCT UA - MICROSCOPIC ONLY
CRYSTALS, UR, HPF, POC: NEGATIVE
Casts, Ur, LPF, POC: NEGATIVE
MUCUS UA: NEGATIVE
Yeast, UA: NEGATIVE

## 2013-08-19 LAB — POCT URINALYSIS DIPSTICK
Bilirubin, UA: NEGATIVE
Glucose, UA: NEGATIVE
KETONES UA: NEGATIVE
Nitrite, UA: POSITIVE
PH UA: 7
Protein, UA: 30
RBC UA: NEGATIVE
SPEC GRAV UA: 1.015
Urobilinogen, UA: 1

## 2013-08-19 MED ORDER — METRONIDAZOLE 500 MG PO TABS: 500.0000 mg | ORAL_TABLET | Freq: Two times a day (BID) | ORAL | Status: DC

## 2013-08-19 MED ORDER — FLUCONAZOLE 150 MG PO TABS: 150.0000 mg | ORAL_TABLET | Freq: Once | ORAL | Status: DC

## 2013-08-19 MED ORDER — CIPROFLOXACIN HCL 500 MG PO TABS: 500.0000 mg | ORAL_TABLET | Freq: Two times a day (BID) | ORAL | Status: DC

## 2013-08-19 NOTE — Progress Notes (Signed)
Subjective: Patient is here for several things. She has a painful skin tag just above her clitoris in her pubic hair. She had shown it to her gynecologist previously and he did not feel like eating needs the be done with it. However this got more painful now.  She has developed odor and symptoms consistent with bacterial vaginosis which she is in the past. Does not think she is a risk of STDs at this time.  Objective: Has a small skin tag just above the clitoris to the right, about 1 CM above. Vaginal mucosa unremarkable except for the whitish creamy discharge. Wet prep was done and shows a lot of clue cells consistent with BV.  Discussed the skin today treatment with her. She agreed to cryosurgery.  Procedure: In a routine fashion the surrounding skin was protected by using an ear speculum. Cryosurgery was done on the little skin tag with free stall refreeze x2 technique. She tolerated this well. Good ice ball formation was obtained. She was instructed in his care.  Results for orders placed in visit on 08/19/13  POCT UA - MICROSCOPIC ONLY      Result Value Ref Range   WBC, Ur, HPF, POC 0-8     RBC, urine, microscopic 0-2     Bacteria, U Microscopic 2+     Mucus, UA neg     Epithelial cells, urine per micros 0-3     Crystals, Ur, HPF, POC neg     Casts, Ur, LPF, POC neg     Yeast, UA neg    POCT URINALYSIS DIPSTICK      Result Value Ref Range   Color, UA yellow     Clarity, UA clear     Glucose, UA neg     Bilirubin, UA neg     Ketones, UA neg     Spec Grav, UA 1.015     Blood, UA neg     pH, UA 7.0     Protein, UA 30     Urobilinogen, UA 1.0     Nitrite, UA positive     Leukocytes, UA small (1+)    POCT WET PREP WITH KOH      Result Value Ref Range   Trichomonas, UA Negative     Clue Cells Wet Prep HPF POC 75%     Epithelial Wet Prep HPF POC 15-30     Yeast Wet Prep HPF POC neg     Bacteria Wet Prep HPF POC 2+     RBC Wet Prep HPF POC neg     WBC Wet Prep HPF POC 3-5      KOH Prep POC Negative     Assessment: Dysuria Vaginal discharge BV Urinary tract infection Skin tag  Plan: Return as needed. Flagyl and Cipro. Gave her Diflucan in case the Cipro causes the yeast infection.

## 2013-08-19 NOTE — Patient Instructions (Signed)
The skin lesion should fall off in the next week or 10 days. If you give you further problems please return.  Take the Cipro one twice daily for urinary tract infection  Take the Flagyl (metronidazole) 1 twice daily with food for bacterial vaginosis  Avoid sexual activity during the next week  Return if further concerns  Drink plenty of water

## 2013-08-19 NOTE — Addendum Note (Signed)
Addended by: Mercer PodWRENN, TAYLOR E on: 08/19/2013 02:07 PM   Modules accepted: Orders

## 2013-08-21 LAB — GC/CHLAMYDIA PROBE AMP
CT Probe RNA: NEGATIVE
GC Probe RNA: NEGATIVE

## 2013-08-22 LAB — URINE CULTURE: Colony Count: >=100000

## 2013-10-06 ENCOUNTER — Encounter (HOSPITAL_COMMUNITY): Payer: Self-pay

## 2013-10-06 ENCOUNTER — Inpatient Hospital Stay (HOSPITAL_COMMUNITY)
Admission: AD | Admit: 2013-10-06 | Discharge: 2013-10-07 | Disposition: A | Discharge status: Home / Self Care | Payer: BC Managed Care – PPO | Source: Ambulatory Visit | Attending: Obstetrics and Gynecology | Admitting: Obstetrics and Gynecology

## 2013-10-06 DIAGNOSIS — Z833 Family history of diabetes mellitus: Secondary | ICD-10-CM | POA: Insufficient documentation

## 2013-10-06 DIAGNOSIS — Z809 Family history of malignant neoplasm, unspecified: Secondary | ICD-10-CM | POA: Insufficient documentation

## 2013-10-06 DIAGNOSIS — O210 Mild hyperemesis gravidarum: Secondary | ICD-10-CM | POA: Diagnosis present

## 2013-10-06 DIAGNOSIS — O99340 Other mental disorders complicating pregnancy, unspecified trimester: Secondary | ICD-10-CM | POA: Insufficient documentation

## 2013-10-06 DIAGNOSIS — G4730 Sleep apnea, unspecified: Secondary | ICD-10-CM | POA: Diagnosis not present

## 2013-10-06 DIAGNOSIS — O219 Vomiting of pregnancy, unspecified: Secondary | ICD-10-CM

## 2013-10-06 DIAGNOSIS — F341 Dysthymic disorder: Secondary | ICD-10-CM | POA: Insufficient documentation

## 2013-10-06 DIAGNOSIS — J45909 Unspecified asthma, uncomplicated: Secondary | ICD-10-CM | POA: Diagnosis not present

## 2013-10-06 MED ORDER — PROMETHAZINE HCL 25 MG/ML IJ SOLN
25.0000 mg | Freq: Once | INTRAVENOUS | Status: AC
  Administered 2013-10-06: 25 mg via INTRAVENOUS
  Filled 2013-10-06: qty 1

## 2013-10-06 NOTE — MAU Provider Note (Signed)
History     [name]  Arrival date and time: 10/06/13 2229   First Provider Initiated Contact with Patient 10/06/13 2257      Chief Complaint  Patient presents with  . Morning Sickness   HPI  Morgan [name] is a 38 y.o. [id number] at [redacted]w[redacted]d who presents today with nausea and vomiting. She states that she has been followed in the office for serial HCGs and has an appointment tomorrow for an [place]. She denies ay bleeding or pain today. She states that "Monday I was just hit with terrible nausea and vomiting". She states that she has vomited multiple times per day. She states that every time she eats or drinks she vomits. She has not been given any anti-nausea medications at this time. She states that she is unable to void today.   Past Medical History  Diagnosis Date  . Allergy   . Sleep apnea   . Anxiety   . Depression   . Asthma   . Abortion 03/09/2013    Past Surgical History  Procedure Laterality Date  . Mouth surgery  1999    Family History  Problem Relation Age of Onset  . HIV Mother   . Seizures Mother     secondary to drug abuse  . Drug abuse Mother   . Heart disease Maternal Grandfather   . Diabetes Paternal Grandmother   . Cancer Paternal Grandfather     History  Substance Use Topics  . Smoking status: Never Smoker   . Smokeless tobacco: Never Used  . Alcohol Use: Yes     Comment: less than once a week-holidays, birthday    Allergies:  Allergies  Allergen Reactions  . Stadol [Butorphanol Tartrate] Itching  . Kale Itching and Rash    Prescriptions prior to admission  Medication Sig Dispense Refill  . amoxicillin (AMOXIL) 875 MG tablet Take 1 tablet (875 mg total) by mouth 2 (two) times daily.  20 tablet  0  . ciprofloxacin (CIPRO) 500 MG tablet Take 1 tablet (500 mg total) by mouth 2 (two) times daily.  10 tablet  0  . citalopram (CELEXA) 20 MG tablet Take 20 mg by mouth daily.      . fluconazole (DIFLUCAN) 150 MG tablet Take 1 tablet (150 mg  total) by mouth once. Repeat if needed  1 tablet  0  . IRON PO Take by mouth.      . metroNIDAZOLE (FLAGYL) 500 MG tablet Take 1 tablet (500 mg total) by mouth 2 (two) times daily.  14 tablet  0  . traZODone (DESYREL) 50 MG tablet Take 25 mg by mouth at bedtime as needed for sleep.      Marland Kitchen triamcinolone cream (KENALOG) 0.1 % Apply 1 application topically 2 (two) times daily.  30 g  0  . trimethoprim-polymyxin b (POLYTRIM) ophthalmic solution Place 1 drop into both eyes every 4 (four) hours.  10 mL  0    ROS Physical Exam   Blood pressure 106/61, pulse 82, temperature 98.8 F (37.1 C), temperature source Oral, resp. rate 18, last menstrual period 08/25/2013.  Physical Exam  Nursing note and vitals reviewed. Constitutional: She is oriented to person, place, and time. She appears well-developed and well-nourished. No distress.  Cardiovascular: Normal rate.   Respiratory: Effort normal.  GI: Soft. There is no tenderness. There is no rebound.  Neurological: She is alert and oriented to person, place, and time.  Skin: Skin is warm and dry.  Psychiatric: She has a normal  mood and affect.    MAU Course  Procedures 0038: Patient has not vomited since arriving here. She has had 1L of D5LR with phenergan at this time. She is tolerating PO  Assessment and Plan   1. Nausea and vomiting during pregnancy prior to [redacted] weeks gestation      Medication List         amoxicillin 875 MG tablet  Commonly known as:  AMOXIL  Take 1 tablet (875 mg total) by mouth 2 (two) times daily.     ciprofloxacin 500 MG tablet  Commonly known as:  CIPRO  Take 1 tablet (500 mg total) by mouth 2 (two) times daily.     citalopram 20 MG tablet  Commonly known as:  CELEXA  Take 20 mg by mouth daily.     Doxylamine-Pyridoxine 10-10 MG Tbec  Commonly known as:  DICLEGIS  Take 2 tablets by mouth at bedtime. If sx persist after 2 days increase to 1 tab PO qam and 2 tabs PO qhs. If sx persist then may increase  further to 1 tab PO qam, 1 tab PO mid-day and 2 tabs PO qhs. Max 4 tabs per day.     fluconazole 150 MG tablet  Commonly known as:  DIFLUCAN  Take 1 tablet (150 mg total) by mouth once. Repeat if needed     IRON PO  Take by mouth.     metroNIDAZOLE 500 MG tablet  Commonly known as:  FLAGYL  Take 1 tablet (500 mg total) by mouth 2 (two) times daily.     promethazine 25 MG tablet  Commonly known as:  PHENERGAN  Take 0.5-1 tablets (12.5-25 mg total) by mouth every 6 (six) hours as needed for nausea or vomiting.     traZODone 50 MG tablet  Commonly known as:  DESYREL  Take 25 mg by mouth at bedtime as needed for sleep.     triamcinolone cream 0.1 %  Commonly known as:  KENALOG  Apply 1 application topically 2 (two) times daily.     trimethoprim-polymyxin b ophthalmic solution  Commonly known as:  POLYTRIM  Place 1 drop into both eyes every 4 (four) hours.       Follow-up Information   [place] [facility] [place] (As scheduled)    Contact information:   [address] [place] [phone]       Tawnya CrookHogan, Heather Donovan 10/06/2013, 11:00 PM

## 2013-10-06 NOTE — MAU Note (Signed)
Pt presents complaining of nausea and vomiting x3 days. States she cannot keep liquid or solids down. Denies vaginal bleeding, discharge and cramping.

## 2013-10-07 DIAGNOSIS — O210 Mild hyperemesis gravidarum: Secondary | ICD-10-CM

## 2013-10-07 MED ORDER — DOXYLAMINE-PYRIDOXINE 10-10 MG PO TBEC: 2.0000 | DELAYED_RELEASE_TABLET | Freq: Every day | ORAL | Status: DC

## 2013-10-07 MED ORDER — PROMETHAZINE HCL 25 MG PO TABS: 12.5000 mg | ORAL_TABLET | Freq: Four times a day (QID) | ORAL | Status: DC | PRN

## 2013-10-07 NOTE — Discharge Instructions (Signed)
Eating Plan for Morning Sickness  Severe cases of hyperemesis gravidarum can lead to dehydration and malnutrition. The hyperemesis eating plan is one way to lessen the symptoms of nausea and vomiting. It is often used with prescribed medicines to control your symptoms.  WHAT CAN I DO TO RELIEVE MY SYMPTOMS? Listen to your body. Everyone is different and has different preferences. Find what works best for you. Some of the following things may help:  Eat and drink slowly.  Eat 5-6 small meals daily instead of 3 large meals.   Eat crackers before you get out of bed in the morning.   Starchy foods are usually well tolerated (such as cereal, toast, bread, potatoes, pasta, rice, and pretzels).   Ginger may help with nausea. Add  tsp ground ginger to hot tea or choose ginger tea.   Try drinking 100% fruit juice or an electrolyte drink.  Continue to take your prenatal vitamins as directed by your health care provider. If you are having trouble taking your prenatal vitamins, talk with your health care provider about different options.  Include at least 1 serving of protein with your meals and snacks (such as meats or poultry, beans, nuts, eggs, or yogurt). Try eating a protein-rich snack before bed (such as cheese and crackers or a half [place] or peanut butter sandwich). WHAT THINGS SHOULD I AVOID TO REDUCE MY SYMPTOMS? The following things may help reduce your symptoms:  Avoid foods with strong smells. Try eating meals in well-ventilated areas that are free of odors.  Avoid drinking water or other beverages with meals. Try not to drink anything less than 30 minutes before and after meals.  Avoid drinking more than 1 cup of fluid at a time.  Avoid fried or high-fat foods, such as butter and cream sauces.  Avoid spicy foods.  Avoid skipping meals the best you can. Nausea can be more intense on an empty stomach. If you cannot tolerate food at that time, do not force it. Try sucking on ice  chips or other frozen items and make up the calories later.  Avoid lying down within 2 hours after eating. Document Released: 12/16/2006 Document Revised: 02/23/2013 Document Reviewed: 12/23/2012 [facility] Patient Information 2015 [place], [place]. This information is not intended to replace advice given to you by your health care provider. Make sure you discuss any questions you have with your health care provider.  Morning Sickness Morning sickness is when you feel sick to your stomach (nauseous) during pregnancy. This nauseous feeling may or may not come with vomiting. It often occurs in the morning but can be a problem any time of day. Morning sickness is most common during the first trimester, but it may continue throughout pregnancy. While morning sickness is unpleasant, it is usually harmless unless you develop severe and continual vomiting (hyperemesis gravidarum). This condition requires more intense treatment.  CAUSES  The cause of morning sickness is not completely known but seems to be related to normal hormonal changes that occur in pregnancy. RISK FACTORS You are at greater risk if you:  Experienced nausea or vomiting before your pregnancy.  Had morning sickness during a previous pregnancy.  Are pregnant with more than one baby, such as twins. TREATMENT  Do not use any medicines (prescription, over-the-counter, or herbal) for morning sickness without first talking to your health care provider. Your health care provider may prescribe or recommend:  Vitamin B6 supplements.  Anti-nausea medicines.  The herbal medicine ginger. HOME CARE INSTRUCTIONS   Only take  over-the-counter or prescription medicines as directed by your health care provider.  Taking multivitamins before getting pregnant can prevent or decrease the severity of morning sickness in most women.  Eat a piece of dry toast or unsalted crackers before getting out of bed in the morning.  Eat five or six small  meals a day.  Eat dry and bland foods (rice, baked potato). Foods high in carbohydrates are often helpful.  Do not drink liquids with your meals. Drink liquids between meals.  Avoid greasy, fatty, and spicy foods.  Get someone to cook for you if the smell of any food causes nausea and vomiting.  If you feel nauseous after taking prenatal vitamins, take the vitamins at night or with a snack.  Snack on protein foods (nuts, yogurt, cheese) between meals if you are hungry.  Eat unsweetened gelatins for desserts.  Wearing an acupressure wristband (worn for sea sickness) may be helpful.  Acupuncture may be helpful.  Do not smoke.  Get a humidifier to keep the air in your house free of odors.  Get plenty of fresh air. SEEK MEDICAL CARE IF:   Your home remedies are not working, and you need medicine.  You feel dizzy or lightheaded.  You are losing weight. SEEK IMMEDIATE MEDICAL CARE IF:   You have persistent and uncontrolled nausea and vomiting.  You pass out (faint). MAKE SURE YOU:  Understand these instructions.  Will watch your condition.  Will get help right away if you are not doing well or get worse. Document Released: 04/11/2006 Document Revised: 02/23/2013 Document Reviewed: 08/05/2012 [facility] Patient Information 2015 [place]. This information is not intended to replace advice given to you by your health care provider. Make sure you discuss any questions you have with your health care provider.

## 2013-10-20 ENCOUNTER — Encounter (HOSPITAL_COMMUNITY): Payer: Self-pay | Admitting: *Deleted

## 2013-10-20 ENCOUNTER — Inpatient Hospital Stay (HOSPITAL_COMMUNITY)
Admission: AD | Admit: 2013-10-20 | Discharge: 2013-10-21 | Disposition: A | Discharge status: Home / Self Care | Payer: BC Managed Care – PPO | Source: Ambulatory Visit | Attending: Obstetrics and Gynecology | Admitting: Obstetrics and Gynecology

## 2013-10-20 DIAGNOSIS — O210 Mild hyperemesis gravidarum: Secondary | ICD-10-CM | POA: Insufficient documentation

## 2013-10-20 DIAGNOSIS — O219 Vomiting of pregnancy, unspecified: Secondary | ICD-10-CM

## 2013-10-20 LAB — URINALYSIS, ROUTINE W REFLEX MICROSCOPIC
GLUCOSE, UA: NEGATIVE mg/dL
Leukocytes, UA: NEGATIVE
Nitrite: NEGATIVE
Protein, ur: 30 mg/dL — AB
Specific Gravity, Urine: 1.025 (ref 1.005–1.030)
UROBILINOGEN UA: 0.2 mg/dL (ref 0.0–1.0)
pH: 6 (ref 5.0–8.0)

## 2013-10-20 LAB — CBC WITH DIFFERENTIAL/PLATELET
Basophils Absolute: 0 10*3/uL (ref 0.0–0.1)
Basophils Relative: 0 % (ref 0–1)
EOS ABS: 0 10*3/uL (ref 0.0–0.7)
Eosinophils Relative: 0 % (ref 0–5)
HCT: 41.8 % (ref 36.0–46.0)
HEMOGLOBIN: 13.8 g/dL (ref 12.0–15.0)
LYMPHS ABS: 1.7 10*3/uL (ref 0.7–4.0)
LYMPHS PCT: 17 % (ref 12–46)
MCH: 26.5 pg (ref 26.0–34.0)
MCHC: 33 g/dL (ref 30.0–36.0)
MCV: 80.4 fL (ref 78.0–100.0)
MONOS PCT: 8 % (ref 3–12)
Monocytes Absolute: 0.8 10*3/uL (ref 0.1–1.0)
Neutro Abs: 7.6 10*3/uL (ref 1.7–7.7)
Neutrophils Relative %: 75 % (ref 43–77)
PLATELETS: 313 10*3/uL (ref 150–400)
RBC: 5.2 MIL/uL — AB (ref 3.87–5.11)
RDW: 14.6 % (ref 11.5–15.5)
WBC: 10.1 10*3/uL (ref 4.0–10.5)

## 2013-10-20 LAB — COMPREHENSIVE METABOLIC PANEL
ALT: 9 U/L (ref 0–35)
ANION GAP: 19 — AB (ref 5–15)
AST: 13 U/L (ref 0–37)
Albumin: 4.1 g/dL (ref 3.5–5.2)
Alkaline Phosphatase: 70 U/L (ref 39–117)
BUN: 13 mg/dL (ref 6–23)
CALCIUM: 10.3 mg/dL (ref 8.4–10.5)
CO2: 22 meq/L (ref 19–32)
Chloride: 98 mEq/L (ref 96–112)
Creatinine, Ser: 0.72 mg/dL (ref 0.50–1.10)
GLUCOSE: 79 mg/dL (ref 70–99)
Potassium: 4.3 mEq/L (ref 3.7–5.3)
SODIUM: 139 meq/L (ref 137–147)
Total Bilirubin: 1.1 mg/dL (ref 0.3–1.2)
Total Protein: 7.9 g/dL (ref 6.0–8.3)

## 2013-10-20 LAB — URINE MICROSCOPIC-ADD ON

## 2013-10-20 LAB — POCT PREGNANCY, URINE: Preg Test, Ur: POSITIVE — AB

## 2013-10-20 MED ORDER — PROMETHAZINE HCL 25 MG/ML IJ SOLN
25.0000 mg | Freq: Once | INTRAVENOUS | Status: AC
  Administered 2013-10-20: 25 mg via INTRAVENOUS
  Filled 2013-10-20: qty 1

## 2013-10-20 MED ORDER — LACTATED RINGERS IV BOLUS (SEPSIS)
1000.0000 mL | Freq: Once | INTRAVENOUS | Status: AC
  Administered 2013-10-20: 1000 mL via INTRAVENOUS

## 2013-10-20 NOTE — MAU Provider Note (Signed)
History     [name]  Arrival date and time: 10/20/13 [id number]   First Provider Initiated Contact with Patient 10/20/13 2137      Chief Complaint  Patient presents with  . Emesis During Pregnancy   HPI Ms. Glade StanfordMonique Price-Cole is a 38 y.o. [id number] at 8028w1d who presents to MAU today with complaint of nausea and vomiting. She has had this throughout the pregnancy and is taking Diclegis and Phenergan. She states that since Monday night she has been unable to keep the medications down. She states abdominal pain only with vomiting. She denies fever, diarrhea or vaginal bleeding.   OB History   Grav Para Term Preterm Abortions TAB SAB Ect Mult Living   7 3 1 2 3 1 2   1       Past Medical History  Diagnosis Date  . Allergy   . Sleep apnea   . Anxiety   . Depression   . Asthma   . Abortion 03/09/2013    Past Surgical History  Procedure Laterality Date  . Mouth surgery  1999    Family History  Problem Relation Age of Onset  . HIV Mother   . Seizures Mother     secondary to drug abuse  . Drug abuse Mother   . Heart disease Maternal Grandfather   . Diabetes Paternal Grandmother   . Cancer Paternal Grandfather     History  Substance Use Topics  . Smoking status: Never Smoker   . Smokeless tobacco: Never Used  . Alcohol Use: Yes     Comment: less than once a week-holidays, birthday    Allergies:  Allergies  Allergen Reactions  . Stadol [Butorphanol Tartrate] Itching  . Kale Itching and Rash    Prescriptions prior to admission  Medication Sig Dispense Refill  . amoxicillin (AMOXIL) 875 MG tablet Take 1 tablet (875 mg total) by mouth 2 (two) times daily.  20 tablet  0  . ciprofloxacin (CIPRO) 500 MG tablet Take 1 tablet (500 mg total) by mouth 2 (two) times daily.  10 tablet  0  . citalopram (CELEXA) 20 MG tablet Take 20 mg by mouth daily.      . Doxylamine-Pyridoxine (DICLEGIS) 10-10 MG TBEC Take 2 tablets by mouth at bedtime. If sx persist after 2 days increase  to 1 tab PO qam and 2 tabs PO qhs. If sx persist then may increase further to 1 tab PO qam, 1 tab PO mid-day and 2 tabs PO qhs. Max 4 tabs per day.  90 tablet  1  . fluconazole (DIFLUCAN) 150 MG tablet Take 1 tablet (150 mg total) by mouth once. Repeat if needed  1 tablet  0  . IRON PO Take by mouth.      . metroNIDAZOLE (FLAGYL) 500 MG tablet Take 1 tablet (500 mg total) by mouth 2 (two) times daily.  14 tablet  0  . promethazine (PHENERGAN) 25 MG tablet Take 0.5-1 tablets (12.5-25 mg total) by mouth every 6 (six) hours as needed for nausea or vomiting.  30 tablet  0  . traZODone (DESYREL) 50 MG tablet Take 25 mg by mouth at bedtime as needed for sleep.      Marland Kitchen. triamcinolone cream (KENALOG) 0.1 % Apply 1 application topically 2 (two) times daily.  30 g  0  . trimethoprim-polymyxin b (POLYTRIM) ophthalmic solution Place 1 drop into both eyes every 4 (four) hours.  10 mL  0    Review of Systems  Constitutional: Positive for  malaise/fatigue. Negative for fever.  Gastrointestinal: Positive for nausea and vomiting. Negative for abdominal pain, diarrhea and constipation.  Genitourinary:       Neg - vaginal bleeding  Neurological: Positive for weakness.   Physical Exam   Blood pressure 134/83, pulse 96, temperature 99.3 F (37.4 C), temperature source Oral, resp. rate 20, height 5\' 4"  (1.626 m), weight 190 lb 9.6 oz (86.456 kg), last menstrual period 08/25/2013, SpO2 97.00%.  Physical Exam  Constitutional: She is oriented to person, place, and time. She appears well-developed and well-nourished. No distress.  HENT:  Head: Normocephalic.  Cardiovascular: Normal rate.   Respiratory: Effort normal.  GI: Soft. Bowel sounds are normal. She exhibits no distension and no mass. There is no tenderness. There is no rebound and no guarding.  Neurological: She is alert and oriented to person, place, and time.  Skin: Skin is warm and dry. No erythema.  Psychiatric: She has a normal mood and affect.    Results for orders placed during the hospital encounter of 10/20/13 (from the past 24 hour(s))  URINALYSIS, ROUTINE W REFLEX MICROSCOPIC     Status: Abnormal   Collection Time    10/20/13  9:30 PM      Result Value Ref Range   Color, Urine YELLOW  YELLOW   APPearance CLEAR  CLEAR   Specific Gravity, Urine 1.025  1.005 - 1.030   pH 6.0  5.0 - 8.0   Glucose, UA NEGATIVE  NEGATIVE mg/dL   Hgb urine dipstick TRACE (*) NEGATIVE   Bilirubin Urine SMALL (*) NEGATIVE   Ketones, ur >80 (*) NEGATIVE mg/dL   Protein, ur 30 (*) NEGATIVE mg/dL   Urobilinogen, UA 0.2  0.0 - 1.0 mg/dL   Nitrite NEGATIVE  NEGATIVE   Leukocytes, UA NEGATIVE  NEGATIVE  URINE MICROSCOPIC-ADD ON     Status: Abnormal   Collection Time    10/20/13  9:30 PM      Result Value Ref Range   Squamous Epithelial / LPF FEW (*) RARE   WBC, UA 3-6  <3 WBC/hpf   RBC / HPF 7-10  <3 RBC/hpf   Bacteria, UA FEW (*) RARE   Urine-Other MUCOUS PRESENT    POCT PREGNANCY, URINE     Status: Abnormal   Collection Time    10/20/13  9:46 PM      Result Value Ref Range   Preg Test, Ur POSITIVE (*) NEGATIVE  CBC WITH DIFFERENTIAL     Status: Abnormal   Collection Time    10/20/13 10:01 PM      Result Value Ref Range   WBC 10.1  4.0 - 10.5 K/uL   RBC 5.20 (*) 3.87 - 5.11 MIL/uL   Hemoglobin 13.8  12.0 - 15.0 g/dL   HCT 73.2  20.2 - 54.2 %   MCV 80.4  78.0 - 100.0 fL   MCH 26.5  26.0 - 34.0 pg   MCHC 33.0  30.0 - 36.0 g/dL   RDW 70.6  23.7 - 62.8 %   Platelets 313  150 - 400 K/uL   Neutrophils Relative % 75  43 - 77 %   Neutro Abs 7.6  1.7 - 7.7 K/uL   Lymphocytes Relative 17  12 - 46 %   Lymphs Abs 1.7  0.7 - 4.0 K/uL   Monocytes Relative 8  3 - 12 %   Monocytes Absolute 0.8  0.1 - 1.0 K/uL   Eosinophils Relative 0  0 - 5 %   Eosinophils Absolute  0.0  0.0 - 0.7 K/uL   Basophils Relative 0  0 - 1 %   Basophils Absolute 0.0  0.0 - 0.1 K/uL  COMPREHENSIVE METABOLIC PANEL     Status: Abnormal   Collection Time    10/20/13  10:01 PM      Result Value Ref Range   Sodium 139  137 - 147 mEq/L   Potassium 4.3  3.7 - 5.3 mEq/L   Chloride 98  96 - 112 mEq/L   CO2 22  19 - 32 mEq/L   Glucose, Bld 79  70 - 99 mg/dL   BUN 13  6 - 23 mg/dL   Creatinine, Ser 1.61  0.50 - 1.10 mg/dL   Calcium 09.6  8.4 - 04.5 mg/dL   Total Protein 7.9  6.0 - 8.3 g/dL   Albumin 4.1  3.5 - 5.2 g/dL   AST 13  0 - 37 U/L   ALT 9  0 - 35 U/L   Alkaline Phosphatase 70  39 - 117 U/L   Total Bilirubin 1.1  0.3 - 1.2 mg/dL   GFR calc non Af Amer >90  >90 mL/min   GFR calc Af Amer >90  >90 mL/min   Anion gap 19 (*) 5 - 15    MAU Course  Procedures None  MDM + UPT UA, CBC and CMP today 1 liter D5LR with 25 mg Phenergan infusion given.  No additional emesis while in MAU. Patient is resting comfortably 1 additional liter of LR given due to > 80 ketones in urine Discussed patient with Dr. Marcelle Overlie. Patient is ok for discharge. Can restart Diclegis regularly as directed or have Rx for Zofran if desired.   Assessment and Plan  A: SIUP at [redacted]w[redacted]d Nausea and vomiting in pregnancy prior to [redacted] weeks gestation  P: Discharge home Rx for Zofran sent to patient's pharmacy Patient advised to continue previously prescribed anti-emetics as directed Patient advised to increased PO hydration and advance diet as tolerated Patient advised to keep appointment for routine prenatal care with [facility] Patient may return to MAU as needed or if her condition were to change or worsen   Freddi Starr, PA-C  10/20/2013, 9:44 PM

## 2013-10-20 NOTE — MAU Note (Signed)
Pt c/o nausea and vomiting for 5 days. States she has vomited numerous times in a day. Cannot keep anything down. States she cannot keep her nausea medicine down-takes phenergan and diclegis. Denies vag bleeding/discharge. Denies pain outside of throat and stomach muscles from vomiting so much.

## 2013-10-21 DIAGNOSIS — O210 Mild hyperemesis gravidarum: Secondary | ICD-10-CM

## 2013-10-21 MED ORDER — ONDANSETRON HCL 4 MG PO TABS: 4.0000 mg | ORAL_TABLET | Freq: Four times a day (QID) | ORAL | Status: DC

## 2013-10-21 NOTE — Discharge Instructions (Signed)
Morning Sickness Morning sickness is when you feel sick to your stomach (nauseous) during pregnancy. This nauseous feeling may or may not come with vomiting. It often occurs in the morning but can be a problem any time of day. Morning sickness is most common during the first trimester, but it may continue throughout pregnancy. While morning sickness is unpleasant, it is usually harmless unless you develop severe and continual vomiting (hyperemesis gravidarum). This condition requires more intense treatment.  CAUSES  The cause of morning sickness is not completely known but seems to be related to normal hormonal changes that occur in pregnancy. RISK FACTORS You are at greater risk if you:  Experienced nausea or vomiting before your pregnancy.  Had morning sickness during a previous pregnancy.  Are pregnant with more than one baby, such as twins. TREATMENT  Do not use any medicines (prescription, over-the-counter, or herbal) for morning sickness without first talking to your health care provider. Your health care provider may prescribe or recommend:  Vitamin B6 supplements.  Anti-nausea medicines.  The herbal medicine ginger. HOME CARE INSTRUCTIONS   Only take over-the-counter or prescription medicines as directed by your health care provider.  Taking multivitamins before getting pregnant can prevent or decrease the severity of morning sickness in most women.  Eat a piece of dry toast or unsalted crackers before getting out of bed in the morning.  Eat five or six small meals a day.  Eat dry and bland foods (rice, baked potato). Foods high in carbohydrates are often helpful.  Do not drink liquids with your meals. Drink liquids between meals.  Avoid greasy, fatty, and spicy foods.  Get someone to cook for you if the smell of any food causes nausea and vomiting.  If you feel nauseous after taking prenatal vitamins, take the vitamins at night or with a snack.  Snack on protein  foods (nuts, yogurt, cheese) between meals if you are hungry.  Eat unsweetened gelatins for desserts.  Wearing an acupressure wristband (worn for sea sickness) may be helpful.  Acupuncture may be helpful.  Do not smoke.  Get a humidifier to keep the air in your house free of odors.  Get plenty of fresh air. SEEK MEDICAL CARE IF:   Your home remedies are not working, and you need medicine.  You feel dizzy or lightheaded.  You are losing weight. SEEK IMMEDIATE MEDICAL CARE IF:   You have persistent and uncontrolled nausea and vomiting.  You pass out (faint). MAKE SURE YOU:  Understand these instructions.  Will watch your condition.  Will get help right away if you are not doing well or get worse. Document Released: 04/11/2006 Document Revised: 02/23/2013 Document Reviewed: 08/05/2012 [facility] Patient Information 2015 [place], [place]. This information is not intended to replace advice given to you by your health care provider. Make sure you discuss any questions you have with your health care provider.  Hyperemesis Gravidarum Hyperemesis gravidarum is a severe form of nausea and vomiting that happens during pregnancy. Hyperemesis is worse than morning sickness. It may cause you to have nausea or vomiting all day for many days. It may keep you from eating and drinking enough food and liquids. Hyperemesis usually occurs during the first half (the first 20 weeks) of pregnancy. It often goes away once a woman is in her second half of pregnancy. However, sometimes hyperemesis continues through an entire pregnancy.  CAUSES  The cause of this condition is not completely known but is thought to be related to changes in  the body's hormones when pregnant. It could be from the high level of the pregnancy hormone or an increase in estrogen in the body.  SIGNS AND SYMPTOMS   Severe nausea and vomiting.  Nausea that does not go away.  Vomiting that does not allow you to keep any food  down.  Weight loss and body fluid loss (dehydration).  Having no desire to eat or not liking food you have previously enjoyed. DIAGNOSIS  Your health care provider will do a physical exam and ask you about your symptoms. He or she may also order blood tests and urine tests to make sure something else is not causing the problem.  TREATMENT  You may only need medicine to control the problem. If medicines do not control the nausea and vomiting, you will be treated in the hospital to prevent dehydration, increased acid in the blood (acidosis), weight loss, and changes in the electrolytes in your body that may harm the unborn baby (fetus). You may need IV fluids.  HOME CARE INSTRUCTIONS   Only take over-the-counter or prescription medicines as directed by your health care provider.  Try eating a couple of dry crackers or toast in the morning before getting out of bed.  Avoid foods and smells that upset your stomach.  Avoid fatty and spicy foods.  Eat 5-6 small meals a day.  Do not drink when eating meals. Drink between meals.  For snacks, eat high-protein foods, such as cheese.  Eat or suck on things that have ginger in them. Ginger helps nausea.  Avoid food preparation. The smell of food can spoil your appetite.  Avoid iron pills and iron in your multivitamins until after 3-4 months of being pregnant. However, consult with your health care provider before stopping any prescribed iron pills. SEEK MEDICAL CARE IF:   Your abdominal pain increases.  You have a severe headache.  You have vision problems.  You are losing weight. SEEK IMMEDIATE MEDICAL CARE IF:   You are unable to keep fluids down.  You vomit blood.  You have constant nausea and vomiting.  You have excessive weakness.  You have extreme thirst.  You have dizziness or fainting.  You have a fever or persistent symptoms for more than 2-3 days.  You have a fever and your symptoms suddenly get worse. MAKE SURE  YOU:   Understand these instructions.  Will watch your condition.  Will get help right away if you are not doing well or get worse. Document Released: 02/18/2005 Document Revised: 12/09/2012 Document Reviewed: 09/30/2012 [facility] Patient Information 2015 [place], [place]. This information is not intended to replace advice given to you by your health care provider. Make sure you discuss any questions you have with your health care provider.

## 2013-11-10 ENCOUNTER — Inpatient Hospital Stay (HOSPITAL_COMMUNITY)
Admission: AD | Admit: 2013-11-10 | Discharge: 2013-11-10 | Disposition: A | Discharge status: Home / Self Care | Payer: BC Managed Care – PPO | Source: Ambulatory Visit | Attending: Obstetrics and Gynecology | Admitting: Obstetrics and Gynecology

## 2013-11-10 ENCOUNTER — Encounter (HOSPITAL_COMMUNITY): Payer: Self-pay

## 2013-11-10 ENCOUNTER — Inpatient Hospital Stay (HOSPITAL_COMMUNITY): Payer: BC Managed Care – PPO

## 2013-11-10 DIAGNOSIS — R1110 Vomiting, unspecified: Secondary | ICD-10-CM | POA: Diagnosis present

## 2013-11-10 DIAGNOSIS — O039 Complete or unspecified spontaneous abortion without complication: Secondary | ICD-10-CM

## 2013-11-10 DIAGNOSIS — O074 Failed attempted termination of pregnancy without complication: Secondary | ICD-10-CM | POA: Insufficient documentation

## 2013-11-10 DIAGNOSIS — O034 Incomplete spontaneous abortion without complication: Secondary | ICD-10-CM

## 2013-11-10 HISTORY — DX: Gestational (pregnancy-induced) hypertension without significant proteinuria, unspecified trimester: O13.9

## 2013-11-10 LAB — URINE MICROSCOPIC-ADD ON

## 2013-11-10 LAB — URINALYSIS, ROUTINE W REFLEX MICROSCOPIC
Glucose, UA: NEGATIVE mg/dL
Ketones, ur: 15 mg/dL — AB
Leukocytes, UA: NEGATIVE
Nitrite: NEGATIVE
Protein, ur: NEGATIVE mg/dL
Specific Gravity, Urine: 1.025 (ref 1.005–1.030)
Urobilinogen, UA: 1 mg/dL (ref 0.0–1.0)
pH: 6 (ref 5.0–8.0)

## 2013-11-10 LAB — CBC
HCT: 38.7 % (ref 36.0–46.0)
Hemoglobin: 12.3 g/dL (ref 12.0–15.0)
MCH: 26.4 pg (ref 26.0–34.0)
MCHC: 31.8 g/dL (ref 30.0–36.0)
MCV: 83 fL (ref 78.0–100.0)
Platelets: 298 10*3/uL (ref 150–400)
RBC: 4.66 MIL/uL (ref 3.87–5.11)
RDW: 14.9 % (ref 11.5–15.5)
WBC: 6.1 10*3/uL (ref 4.0–10.5)

## 2013-11-10 LAB — WET PREP, GENITAL
CLUE CELLS WET PREP: NONE SEEN
Trich, Wet Prep: NONE SEEN
Yeast Wet Prep HPF POC: NONE SEEN

## 2013-11-10 LAB — HCG, QUANTITATIVE, PREGNANCY: HCG, BETA CHAIN, QUANT, S: 384 m[IU]/mL — AB (ref <5)

## 2013-11-10 MED ORDER — ONDANSETRON 4 MG PO TBDP: 4.0000 mg | ORAL_TABLET | Freq: Three times a day (TID) | ORAL | Status: DC | PRN

## 2013-11-10 MED ORDER — PROMETHAZINE HCL 25 MG RE SUPP: 25.0000 mg | Freq: Four times a day (QID) | RECTAL | Status: DC | PRN

## 2013-11-10 MED ORDER — ONDANSETRON 8 MG PO TBDP
8.0000 mg | ORAL_TABLET | Freq: Once | ORAL | Status: AC
  Administered 2013-11-10: 8 mg via ORAL
  Filled 2013-11-10: qty 1

## 2013-11-10 NOTE — MAU Provider Note (Signed)
History     CSN: [id number]  Arrival date and time: 11/10/13 1945   None     Chief Complaint  Patient presents with  . Emesis   HPI  Morgan [name] is a 38 y.o. [id number] who presents today after a TAB on 10/19/13. It was a surgical termination. She states that she has continued to be nauseous after the procedure. She states that she vomits after she eats every time, and today she has vomited twice. She has anti-nausea medications, but she cannot keep them down. She saw Dr. Langston Masker on Friday, and had a pelvic exam done. She is also still bleeding. It was like spotting until today, and now it is more like a period. She denies any pain or fever.   Past Medical History  Diagnosis Date  . Allergy   . Sleep apnea   . Anxiety   . Depression   . Asthma   . Abortion 03/09/2013  . Pregnancy induced hypertension     Past Surgical History  Procedure Laterality Date  . Mouth surgery  1999  . Cervical cerclage      all 3 pregnancies  . Dilation and curettage of uterus  2001    retained placenta  . Dilation and curettage of uterus  2006    SAB    Family History  Problem Relation Age of Onset  . HIV Mother   . Seizures Mother     secondary to drug abuse  . Drug abuse Mother   . Heart disease Maternal Grandfather   . Diabetes Paternal Grandmother   . Cancer Paternal Grandfather     History  Substance Use Topics  . Smoking status: Never Smoker   . Smokeless tobacco: Never Used  . Alcohol Use: No     Comment: less than once a week-holidays, birthday    Allergies:  Allergies  Allergen Reactions  . Stadol [Butorphanol Tartrate] Itching  . Kale Itching and Rash    Prescriptions prior to admission  Medication Sig Dispense Refill  . acetaminophen (TYLENOL) 325 MG tablet Take 650 mg by mouth every 6 (six) hours as needed for moderate pain.      . IRON PO Take 1 tablet by mouth daily.       . ondansetron (ZOFRAN) 4 MG tablet Take 1 tablet (4 mg total) by mouth every 6  (six) hours.  12 tablet  0  . traZODone (DESYREL) 50 MG tablet Take 25 mg by mouth at bedtime as needed for sleep.        ROS Physical Exam   Blood pressure 130/84, pulse 77, temperature 98.7 F (37.1 C), resp. rate 18, height  (1.626 m), weight 83.462 kg (184 lb), last menstrual period 08/25/2013, SpO2 98.00%.  Physical Exam  MAU Course  Procedures  Results for orders placed during the hospital encounter of 11/10/13 (from the past 24 hour(s))  URINALYSIS, ROUTINE W REFLEX MICROSCOPIC     Status: Abnormal   Collection Time    11/10/13  8:10 PM      Result Value Ref Range   Color, Urine YELLOW  YELLOW   APPearance CLEAR  CLEAR   Specific Gravity, Urine 1.025  1.005 - 1.030   pH 6.0  5.0 - 8.0   Glucose, UA NEGATIVE  NEGATIVE mg/dL   Hgb urine dipstick LARGE (*) NEGATIVE   Bilirubin Urine SMALL (*) NEGATIVE   Ketones, ur 15 (*) NEGATIVE mg/dL   Protein, ur NEGATIVE  NEGATIVE mg/dL   Urobilinogen, UA  1.0  0.0 - 1.0 mg/dL   Nitrite NEGATIVE  NEGATIVE   Leukocytes, UA NEGATIVE  NEGATIVE  URINE MICROSCOPIC-ADD ON     Status: None   Collection Time    11/10/13  8:10 PM      Result Value Ref Range   Squamous Epithelial / LPF RARE  RARE   WBC, UA 0-2  <3 WBC/hpf   RBC / HPF 11-20  <3 RBC/hpf   Bacteria, UA RARE  RARE   Urine-Other MUCOUS PRESENT    CBC     Status: None   Collection Time    11/10/13  9:20 PM      Result Value Ref Range   WBC 6.1  4.0 - 10.5 K/uL   RBC 4.66  3.87 - 5.11 MIL/uL   Hemoglobin 12.3  12.0 - 15.0 g/dL   HCT 16.1  09.6 - 04.5 %   MCV 83.0  78.0 - 100.0 fL   MCH 26.4  26.0 - 34.0 pg   MCHC 31.8  30.0 - 36.0 g/dL   RDW 40.9  81.1 - 91.4 %   Platelets 298  150 - 400 K/uL  ABO/RH     Status: None   Collection Time    11/10/13  9:20 PM      Result Value Ref Range   ABO/RH(D) O POS    HCG, QUANTITATIVE, PREGNANCY     Status: Abnormal   Collection Time    11/10/13  9:20 PM      Result Value Ref Range   hCG, Beta Chain, Quant, S 384 (*) <5  mIU/mL   US Transvaginal Non-ob  11/10/2013   CLINICAL DATA:  Recent therapeutic abortion with bleeding.  EXAM: TRANSABDOMINAL AND TRANSVAGINAL ULTRASOUND OF PELVIS  TECHNIQUE: Study was performed transabdominally to optimize pelvic field of view evaluation and transvaginally to optimize internal visceral architecture evaluation.  COMPARISON:  None  FINDINGS: Uterus  Measurements: 8.8 x 4.2 x 4.5 cm. No fibroids or other mass visualized.  Endometrium  In the superior endometrium, there is a 9 mm slightly complex focus which has mixed echogenicity. More inferiorly, the endometrium measures 2 mm in thickness.  Right ovary  Measurements: 2.4 x 1.2 x 1.0 cm. Normal appearance/no adnexal mass.  Left ovary  Measurements: 2.9 x 1.7 x 1.3 cm. Normal appearance/no adnexal mass.  Other findings  No free fluid.  IMPRESSION: Somewhat complex area in the upper endometrium measuring 9 mm in thickness. The appearance in this area suggests involving hemorrhage, although a small amount of retained products of conception could present in this manner. This finding should warrant close clinical and imaging surveillance. If bleeding persists, follow-up study would certainly be warranted. Indeed, given this appearance, if the suspicion for retained products of conception is high, it would be reasonable to consider tissue sampling of this area.   Electronically Signed   By: Bretta Bang M.D.   On: 11/10/2013 21:54   US Pelvis Complete  11/10/2013   CLINICAL DATA:  Recent therapeutic abortion with bleeding.  EXAM: TRANSABDOMINAL AND TRANSVAGINAL ULTRASOUND OF PELVIS  TECHNIQUE: Study was performed transabdominally to optimize pelvic field of view evaluation and transvaginally to optimize internal visceral architecture evaluation.  COMPARISON:  None  FINDINGS: Uterus  Measurements: 8.8 x 4.2 x 4.5 cm. No fibroids or other mass visualized.  Endometrium  In the superior endometrium, there is a 9 mm slightly complex focus which has  mixed echogenicity. More inferiorly, the endometrium measures 2 mm in thickness.  Right  ovary  Measurements: 2.4 x 1.2 x 1.0 cm. Normal appearance/no adnexal mass.  Left ovary  Measurements: 2.9 x 1.7 x 1.3 cm. Normal appearance/no adnexal mass.  Other findings  No free fluid.  IMPRESSION: Somewhat complex area in the upper endometrium measuring 9 mm in thickness. The appearance in this area suggests involving hemorrhage, although a small amount of retained products of conception could present in this manner. This finding should warrant close clinical and imaging surveillance. If bleeding persists, follow-up study would certainly be warranted. Indeed, given this appearance, if the suspicion for retained products of conception is high, it would be reasonable to consider tissue sampling of this area.   Electronically Signed   By: Bretta Bang M.D.   On: 11/10/2013 21:54   2124: D/W Dr. Vincente Poli, will dc home with antiemetics, and have pt FU with the office on Friday for repeat HCG.  Assessment and Plan   1. Retained products of conception following abortion    Bleeding precautions  Return to MAU as needed    Medication List    STOP taking these medications       ondansetron 4 MG tablet  Commonly known as:  ZOFRAN      TAKE these medications       acetaminophen 325 MG tablet  Commonly known as:  TYLENOL  Take 650 mg by mouth every 6 (six) hours as needed for moderate pain.     IRON PO  Take 1 tablet by mouth daily.     ondansetron 4 MG disintegrating tablet  Commonly known as:  ZOFRAN ODT  Take 1 tablet (4 mg total) by mouth every 8 (eight) hours as needed for nausea or vomiting.     promethazine 25 MG suppository  Commonly known as:  PHENERGAN  Place 1 suppository (25 mg total) rectally every 6 (six) hours as needed for nausea or vomiting.     traZODone 50 MG tablet  Commonly known as:  DESYREL  Take 25 mg by mouth at bedtime as needed for sleep.       Follow-up Information    Follow up with MORRIS, MEGAN, DO. (Call for an appointment for [facility] )    Specialty:  Obstetrics and Gynecology   Contact information:   [address] n [address] [place] [address] [phone]        Tawnya Crook 11/10/2013, 10:06 PM

## 2013-11-10 NOTE — MAU Note (Signed)
Pt reports she had a TAB on 08/18, and states she continues to have nausea and vomiting every day. Denies pain. Bleeding continues but she is only using 2-3 pads per day.

## 2013-11-10 NOTE — Discharge Instructions (Signed)
°  HOME CARE INSTRUCTIONS   Your caregiver may order bed rest or may allow you to continue light activity. Resume activity as directed by your caregiver.  Have someone help with home and family responsibilities during this time.   Keep track of the number of sanitary pads you use each day and how soaked (saturated) they are. Write down this information.   Do not use tampons. Do not douche or have sexual intercourse until approved by your caregiver.   Only take over-the-counter or prescription medicines for pain or discomfort as directed by your caregiver.   Do not take aspirin. Aspirin can cause bleeding.   Keep all follow-up appointments with your caregiver.   If you or your partner have problems with grieving, talk to your caregiver or seek counseling to help cope with the pregnancy loss. Allow enough time to grieve before trying to get pregnant again.  SEEK IMMEDIATE MEDICAL CARE IF:   You have severe cramps or pain in your back or abdomen.  You have a fever.  You pass large blood clots (walnut-sized or larger) ortissue from your vagina. Save any tissue for your caregiver to inspect.   Your bleeding increases.   You have a thick, bad-smelling vaginal discharge.  You become lightheaded, weak, or you faint.   You have chills.  MAKE SURE YOU:  Understand these instructions.  Will watch your condition.  Will get help right away if you are not doing well or get worse. Document Released: 08/14/2000 Document Revised: 06/15/2012 Document Reviewed: 04/09/2011 [facility] Patient Information 2015 [place], [place]. This information is not intended to replace advice given to you by your health care provider. Make sure you discuss any questions you have with your health care provider.

## 2013-11-11 LAB — ABO/RH: ABO/RH(D): O POS

## 2013-11-19 ENCOUNTER — Other Ambulatory Visit: Payer: Self-pay | Admitting: Obstetrics & Gynecology

## 2014-01-03 ENCOUNTER — Encounter (HOSPITAL_COMMUNITY): Payer: Self-pay

## 2014-03-02 DIAGNOSIS — F419 Anxiety disorder, unspecified: Secondary | ICD-10-CM | POA: Insufficient documentation

## 2014-03-02 DIAGNOSIS — Z79899 Other long term (current) drug therapy: Secondary | ICD-10-CM | POA: Insufficient documentation

## 2014-03-02 DIAGNOSIS — Z8669 Personal history of other diseases of the nervous system and sense organs: Secondary | ICD-10-CM | POA: Diagnosis not present

## 2014-03-02 DIAGNOSIS — J45909 Unspecified asthma, uncomplicated: Secondary | ICD-10-CM | POA: Insufficient documentation

## 2014-03-02 DIAGNOSIS — F329 Major depressive disorder, single episode, unspecified: Secondary | ICD-10-CM | POA: Insufficient documentation

## 2014-03-02 DIAGNOSIS — R51 Headache: Secondary | ICD-10-CM | POA: Insufficient documentation

## 2014-03-03 ENCOUNTER — Encounter (HOSPITAL_COMMUNITY): Payer: Self-pay | Admitting: Emergency Medicine

## 2014-03-03 ENCOUNTER — Emergency Department (HOSPITAL_COMMUNITY)
Admission: EM | Admit: 2014-03-03 | Discharge: 2014-03-03 | Disposition: A | Discharge status: Home / Self Care | Payer: BC Managed Care – PPO | Attending: Emergency Medicine | Admitting: Emergency Medicine

## 2014-03-03 DIAGNOSIS — R51 Headache: Secondary | ICD-10-CM | POA: Diagnosis not present

## 2014-03-03 DIAGNOSIS — R519 Headache, unspecified: Secondary | ICD-10-CM

## 2014-03-03 MED ORDER — SODIUM CHLORIDE 0.9 % IV BOLUS (SEPSIS)
1000.0000 mL | INTRAVENOUS | Status: AC
  Administered 2014-03-03: 1000 mL via INTRAVENOUS

## 2014-03-03 MED ORDER — METOCLOPRAMIDE HCL 5 MG/ML IJ SOLN
10.0000 mg | INTRAMUSCULAR | Status: AC
  Administered 2014-03-03: 10 mg via INTRAVENOUS
  Filled 2014-03-03: qty 2

## 2014-03-03 MED ORDER — KETOROLAC TROMETHAMINE 30 MG/ML IJ SOLN
30.0000 mg | INTRAMUSCULAR | Status: AC
  Administered 2014-03-03: 30 mg via INTRAVENOUS
  Filled 2014-03-03: qty 1

## 2014-03-03 MED ORDER — DIPHENHYDRAMINE HCL 50 MG/ML IJ SOLN
25.0000 mg | INTRAMUSCULAR | Status: AC
  Administered 2014-03-03: 25 mg via INTRAVENOUS
  Filled 2014-03-03: qty 1

## 2014-03-03 NOTE — Discharge Instructions (Signed)
Please follow the directions provided. Be sure to follow-up with your primary care provider to discuss preventive medicine for your migraines. Don't hesitate to return for any new, worsening or concerning symptoms.     SEEK IMMEDIATE MEDICAL CARE IF:  Your migraine becomes severe.  You have a fever.  You have a stiff neck.  You have vision loss.  You have muscular weakness or loss of muscle control.  You start losing your balance or have trouble walking.  You feel faint or pass out.  You have severe symptoms that are different from your first symptoms.

## 2014-03-03 NOTE — ED Notes (Addendum)
Pt. reports migraine headache , photophobia and nausea onset Sunday this week , denies head injury , no blurred vision .

## 2014-03-03 NOTE — ED Notes (Signed)
NP at BS.

## 2014-03-03 NOTE — ED Provider Notes (Signed)
[name]     Arrival date & time 03/02/14  2356 History   First MD Initiated Contact with Patient 03/03/14 0026     Chief Complaint  Patient presents with  . Migraine   (Consider location/radiation/quality/duration/timing/severity/associated sxs/prior Treatment) HPI Morgan [name] is a 38 yo female presenting with headache x 3 days.  She reports the headache began gradual but then became progressively worse.  She describes it as a dull ache across the left side of her forehead that also makes the back of her neck hurt.  She rates the pain currently as 7/10.  She reports some nausea but no vomiting.  She denies any neck stiffness, blurred vision, fever, weakness or focal deficit.    Past Medical History  Diagnosis Date  . Allergy   . Sleep apnea   . Anxiety   . Depression   . Asthma   . Abortion 03/09/2013  . Pregnancy induced hypertension    Past Surgical History  Procedure Laterality Date  . Mouth surgery  1999  . Cervical cerclage      all 3 pregnancies  . Dilation and curettage of uterus  2001    retained placenta  . Dilation and curettage of uterus  2006    SAB   Family History  Problem Relation Age of Onset  . HIV Mother   . Seizures Mother     secondary to drug abuse  . Drug abuse Mother   . Heart disease Maternal Grandfather   . Diabetes Paternal Grandmother   . Cancer Paternal Grandfather    History  Substance Use Topics  . Smoking status: Never Smoker   . Smokeless tobacco: Never Used  . Alcohol Use: No     Comment: less than once a week-holidays, birthday   OB History    Gravida Para Term Preterm AB TAB SAB Ectopic Multiple Living   7 3 1 2 4 2 2  0 0 1     Review of Systems  Constitutional: Negative for fever and chills.  HENT: Negative for sore throat.   Eyes: Negative for visual disturbance.  Respiratory: Negative for cough and shortness of breath.   Cardiovascular: Negative for chest pain and leg swelling.  Gastrointestinal: Positive  for nausea. Negative for vomiting and diarrhea.  Genitourinary: Negative for dysuria.  Musculoskeletal: Negative for myalgias.  Skin: Negative for rash.  Neurological: Positive for headaches. Negative for weakness and numbness.    Allergies  Stadol and Kale  Home Medications   Prior to Admission medications   Medication Sig Start Date End Date Taking? Authorizing Provider  acetaminophen (TYLENOL) 325 MG tablet Take 650 mg by mouth every 6 (six) hours as needed for moderate pain.    Historical Provider, MD  IRON PO Take 1 tablet by mouth daily.     Historical Provider, MD  ondansetron (ZOFRAN ODT) 4 MG disintegrating tablet Take 1 tablet (4 mg total) by mouth every 8 (eight) hours as needed for nausea or vomiting. 11/10/13   Tawnya CrookHeather Donovan Hogan, CNM  promethazine (PHENERGAN) 25 MG suppository Place 1 suppository (25 mg total) rectally every 6 (six) hours as needed for nausea or vomiting. 11/10/13   Tawnya CrookHeather Donovan Hogan, CNM  traZODone (DESYREL) 50 MG tablet Take 25 mg by mouth at bedtime as needed for sleep.    Historical Provider, MD   BP 116/70 mmHg  Pulse 60  Temp(Src) 97.8 F (36.6 C) (Oral)  Resp 20  Ht 5\' 5"  (1.651 m)  Wt 195 lb (88.451  kg)  BMI 32.45 kg/m2  SpO2 98%  LMP 02/21/2014 Physical Exam  Constitutional: She is oriented to person, place, and time. She appears well-developed and well-nourished. No distress.  HENT:  Head: Normocephalic and atraumatic.  Mouth/Throat: Oropharynx is clear and moist. No oropharyngeal exudate.  Eyes: Conjunctivae are normal.  Neck: Neck supple. No thyromegaly present.  Cardiovascular: Normal rate, regular rhythm and intact distal pulses.   Pulmonary/Chest: Effort normal and breath sounds normal. No respiratory distress. She has no wheezes. She has no rales. She exhibits no tenderness.  Abdominal: Soft. There is no tenderness.  Musculoskeletal: She exhibits no tenderness.  Lymphadenopathy:    She has no cervical adenopathy.   Neurological: She is alert and oriented to person, place, and time. She has normal strength. No cranial nerve deficit or sensory deficit. GCS eye subscore is 4. GCS verbal subscore is 5. GCS motor subscore is 6.  Skin: Skin is warm and dry. No rash noted. She is not diaphoretic.  Psychiatric: She has a normal mood and affect.  Nursing note and vitals reviewed.   ED Course  Procedures (including critical care time) Labs Review Labs Reviewed - No data to display  Imaging Review No results found.   EKG Interpretation None      MDM   Final diagnoses:  Acute nonintractable headache, unspecified headache type   38 yo with gradual onset headache like pt's typical presentation for headache. Her neuro exam is normal, she is afebrile and she has no focal deficits. There is no concern for SAH, ICH, Meningitis, or temporal arteritis.  Headache treated with NS bolus toradol, reglan and benedryl and improved.  Pt is well-appearing, in no acute distress and vital signs are stable.  They appear safe to be discharged.  Discharge include follow-up with their PCP.  Return precautions provided.   Pt verbalizes understanding and is agreeable with plan to dc.    Filed Vitals:   03/03/14 0130 03/03/14 0145 03/03/14 0200 03/03/14 0215  BP: 113/70 110/69 114/69 106/74  Pulse: 56 57 53 55  Temp:      TempSrc:      Resp: 18 19 18 18   Height:      Weight:      SpO2: 99% 99% 100% 99%   Meds given in ED:  Medications  sodium chloride 0.9 % bolus 1,000 mL (0 mLs Intravenous Stopped 03/03/14 0232)  ketorolac (TORADOL) 30 MG/ML injection 30 mg (30 mg Intravenous Given 03/03/14 0051)  metoCLOPramide (REGLAN) injection 10 mg (10 mg Intravenous Given 03/03/14 0051)  diphenhydrAMINE (BENADRYL) injection 25 mg (25 mg Intravenous Given 03/03/14 0052)    New Prescriptions   No medications on file       Harle BattiestElizabeth Dyshawn Cangelosi, NP 03/04/14 2030  Vanetta MuldersScott Zackowski, MD 03/09/14 [phone]

## 2014-04-19 ENCOUNTER — Ambulatory Visit (INDEPENDENT_AMBULATORY_CARE_PROVIDER_SITE_OTHER): Payer: BLUE CROSS/BLUE SHIELD | Admitting: Emergency Medicine

## 2014-04-19 VITALS — BP 122/80 | HR 89 | Temp 98.6°F | Resp 21 | Ht 65.0 in | Wt 190.2 lb

## 2014-04-19 DIAGNOSIS — N10 Acute tubulo-interstitial nephritis: Secondary | ICD-10-CM

## 2014-04-19 DIAGNOSIS — N201 Calculus of ureter: Secondary | ICD-10-CM | POA: Diagnosis not present

## 2014-04-19 LAB — POCT URINALYSIS DIPSTICK
BILIRUBIN UA: NEGATIVE
Glucose, UA: NEGATIVE
KETONES UA: NEGATIVE
NITRITE UA: POSITIVE
Protein, UA: 100
Spec Grav, UA: 1.02
UROBILINOGEN UA: 0.2
pH, UA: 7

## 2014-04-19 LAB — POCT UA - MICROSCOPIC ONLY
CRYSTALS, UR, HPF, POC: NEGATIVE
Casts, Ur, LPF, POC: NEGATIVE
EPITHELIAL CELLS, URINE PER MICROSCOPY: NEGATIVE
Mucus, UA: NEGATIVE
Yeast, UA: NEGATIVE

## 2014-04-19 MED ORDER — CIPROFLOXACIN HCL 500 MG PO TABS: 500.0000 mg | ORAL_TABLET | Freq: Two times a day (BID) | ORAL | Status: DC

## 2014-04-19 MED ORDER — KETOROLAC TROMETHAMINE 60 MG/2ML IM SOLN: 60.0000 mg | Freq: Once | INTRAMUSCULAR | Status: AC

## 2014-04-19 MED ORDER — KETOROLAC TROMETHAMINE 60 MG/2ML IM SOLN
60.0000 mg | Freq: Once | INTRAMUSCULAR | Status: AC
  Administered 2014-04-19: 60 mg via INTRAMUSCULAR

## 2014-04-19 MED ORDER — HYDROCODONE-ACETAMINOPHEN 5-325 MG PO TABS: 1.0000 | ORAL_TABLET | ORAL | Status: DC | PRN

## 2014-04-19 NOTE — Patient Instructions (Signed)
Pyelonephritis, Adult °Pyelonephritis is a kidney infection. In general, there are 2 main types of pyelonephritis: °· Infections that come on quickly without any warning (acute pyelonephritis). °· Infections that persist for a long period of time (chronic pyelonephritis). °CAUSES  °Two main causes of pyelonephritis are: °· Bacteria traveling from the bladder to the kidney. This is a problem especially in pregnant women. The urine in the bladder can become filled with bacteria from multiple causes, including: °¨ Inflammation of the prostate gland (prostatitis). °¨ Sexual intercourse in females. °¨ Bladder infection (cystitis). °· Bacteria traveling from the bloodstream to the tissue part of the kidney. °Problems that may increase your risk of getting a kidney infection include: °· Diabetes. °· Kidney stones or bladder stones. °· Cancer. °· Catheters placed in the bladder. °· Other abnormalities of the kidney or ureter. °SYMPTOMS  °· Abdominal pain. °· Pain in the side or flank area. °· Fever. °· Chills. °· Upset stomach. °· Blood in the urine (dark urine). °· Frequent urination. °· Strong or persistent urge to urinate. °· Burning or stinging when urinating. °DIAGNOSIS  °Your caregiver may diagnose your kidney infection based on your symptoms. A urine sample may also be taken. °TREATMENT  °In general, treatment depends on how severe the infection is.  °· If the infection is mild and caught early, your caregiver may treat you with oral antibiotics and send you home. °· If the infection is more severe, the bacteria may have gotten into the bloodstream. This will require intravenous (IV) antibiotics and a hospital stay. Symptoms may include: °¨ High fever. °¨ Severe flank pain. °¨ Shaking chills. °· Even after a hospital stay, your caregiver may require you to be on oral antibiotics for a period of time. °· Other treatments may be required depending upon the cause of the infection. °HOME CARE INSTRUCTIONS  °· Take your  antibiotics as directed. Finish them even if you start to feel better. °· Make an appointment to have your urine checked to make sure the infection is gone. °· Drink enough fluids to keep your urine clear or pale yellow. °· Take medicines for the bladder if you have urgency and frequency of urination as directed by your caregiver. °SEEK IMMEDIATE MEDICAL CARE IF:  °· You have a fever or persistent symptoms for more than 2-3 days. °· You have a fever and your symptoms suddenly get worse. °· You are unable to take your antibiotics or fluids. °· You develop shaking chills. °· You experience extreme weakness or fainting. °· There is no improvement after 2 days of treatment. °MAKE SURE YOU: °· Understand these instructions. °· Will watch your condition. °· Will get help right away if you are not doing well or get worse. °Document Released: 02/18/2005 Document Revised: 08/20/2011 Document Reviewed: 07/25/2010 °[employer]® Patient Information ©2015 [employer]. This information is not intended to replace advice given to you by your health care provider. Make sure you discuss any questions you have with your health care provider. ° °

## 2014-04-19 NOTE — Addendum Note (Signed)
Addended by: Carmelina DaneANDERSON, Ayn Domangue S on: 04/19/2014 09:21 PM   Modules accepted: Orders

## 2014-04-19 NOTE — Progress Notes (Signed)
[facility] [address] [place] [address] [phone]  Date:  04/19/2014   Name:  Morgan [name]   DOB:  April 17, 1975   MRN:  [id number]  PCP:  Nilda Simmer, MD    Chief Complaint: Nephrolithiasis   History of Present Illness:  Morgan [name] is a 39 y.o. very pleasant female patient who presents with the following:  Patient with history of kidney stone.  Has pain in left CVA that started Friday and associated with dysuria and hematuria.  Menses started today Pain largely in left flank and increase with activity decrease with rest Some nausea no vomiting No stool change No vaginal discharge No fever or chills Says pain is similar to last stone No improvement with over the counter medications or other home remedies.  jd   Patient Active Problem List   Diagnosis Date Noted  . Ureterolithiasis 04/19/2014  . Migraine 03/18/2013  . OSA on CPAP 03/18/2013    Past Medical History  Diagnosis Date  . Allergy   . Sleep apnea   . Anxiety   . Depression   . Asthma   . Abortion 03/09/2013  . Pregnancy induced hypertension     Past Surgical History  Procedure Laterality Date  . Mouth surgery  1999  . Cervical cerclage      all 3 pregnancies  . Dilation and curettage of uterus  2001    retained placenta  . Dilation and curettage of uterus  2006    SAB  . Tubal ligation      History  Substance Use Topics  . Smoking status: Never Smoker   . Smokeless tobacco: Never Used  . Alcohol Use: No     Comment: less than once a week-holidays, birthday    Family History  Problem Relation Age of Onset  . HIV Mother   . Seizures Mother     secondary to drug abuse  . Drug abuse Mother   . Heart disease Maternal Grandfather   . Diabetes Paternal Grandmother   . Cancer Paternal Grandfather     Allergies  Allergen Reactions  . Stadol [Butorphanol Tartrate] Itching  . Kale Itching and Rash    Medication list has been reviewed and  updated.  Current Outpatient Prescriptions on File Prior to Visit  Medication Sig Dispense Refill  . acetaminophen (TYLENOL) 325 MG tablet Take 650 mg by mouth every 6 (six) hours as needed for moderate pain.    . IRON PO Take 1 tablet by mouth daily.     . ondansetron (ZOFRAN ODT) 4 MG disintegrating tablet Take 1 tablet (4 mg total) by mouth every 8 (eight) hours as needed for nausea or vomiting. (Patient not taking: Reported on 04/19/2014) 20 tablet 0  . promethazine (PHENERGAN) 25 MG suppository Place 1 suppository (25 mg total) rectally every 6 (six) hours as needed for nausea or vomiting. (Patient not taking: Reported on 04/19/2014) 12 each 0  . traZODone (DESYREL) 50 MG tablet Take 25 mg by mouth at bedtime as needed for sleep.     No current facility-administered medications on file prior to visit.    Review of Systems:  As per HPI, otherwise negative.    Physical Examination: Filed Vitals:   04/19/14 1939  BP: 122/80  Pulse: 89  Temp: 98.6 F (37 C)  Resp: 21   Filed Vitals:   04/19/14 1939  Height:  (1.651 m)  Weight: 190 lb 3.2 oz (86.274 kg)   Body mass index  is 31.65 kg/(m^2). Ideal Body Weight: Weight in (lb) to have BMI = 25: 149.9  GEN: WDWN, NAD, Non-toxic, A & O x 3 HEENT: Atraumatic, Normocephalic. Neck supple. No masses, No LAD. Ears and Nose: No external deformity. CV: RRR, No M/G/R. No JVD. No thrill. No extra heart sounds. PULM: CTA B, no wheezes, crackles, rhonchi. No retractions. No resp. distress. No accessory muscle use. ABD: S, Left mid abdominal tenderness , ND, +BS. No rebound. No HSM.  Left CVA tenderness EXTR: No c/c/e NEURO Normal gait.  PSYCH: Normally interactive. Conversant. Not depressed or anxious appearing.  Calm demeanor.    Assessment and Plan: Pyelonephritis Cannot exclude a stone cipro CT in AM  Signed,  Phillips OdorJeffery Harlon Kutner, MD  Results for orders placed or performed in visit on 04/19/14  POCT UA - Microscopic Only   Result Value Ref Range   WBC, Ur, HPF, POC TNTC    RBC, urine, microscopic 25-30    Bacteria, U Microscopic 3+    Mucus, UA neg    Epithelial cells, urine per micros neg    Crystals, Ur, HPF, POC neg    Casts, Ur, LPF, POC neg    Yeast, UA neg   POCT urinalysis dipstick  Result Value Ref Range   Color, UA yellow    Clarity, UA cloudy    Glucose, UA neg    Bilirubin, UA neg    Ketones, UA neg    Spec Grav, UA 1.020    Blood, UA moderate    pH, UA 7.0    Protein, UA 100    Urobilinogen, UA 0.2    Nitrite, UA positive    Leukocytes, UA moderate (2+)

## 2014-04-20 NOTE — Addendum Note (Signed)
Addended by: Eddie CandleLUCK, Tony Granquist L on: 04/20/2014 09:31 AM   Modules accepted: Orders

## 2014-04-22 ENCOUNTER — Ambulatory Visit
Admission: RE | Admit: 2014-04-22 | Discharge: 2014-04-22 | Disposition: A | Discharge status: Home / Self Care | Payer: BLUE CROSS/BLUE SHIELD | Source: Ambulatory Visit | Attending: Emergency Medicine | Admitting: Emergency Medicine

## 2014-04-22 DIAGNOSIS — N10 Acute tubulo-interstitial nephritis: Secondary | ICD-10-CM

## 2014-04-22 DIAGNOSIS — N201 Calculus of ureter: Secondary | ICD-10-CM

## 2014-04-25 ENCOUNTER — Telehealth: Payer: Self-pay

## 2014-04-25 NOTE — Telephone Encounter (Signed)
Patient returned call regarding her CT Scan   Please call again.   [phone] (H)

## 2014-04-26 NOTE — Telephone Encounter (Signed)
Patient called again about her CT results. Allie took the call

## 2014-05-16 ENCOUNTER — Encounter (HOSPITAL_COMMUNITY): Payer: Self-pay | Admitting: Emergency Medicine

## 2014-05-16 ENCOUNTER — Emergency Department (HOSPITAL_COMMUNITY)
Admission: EM | Admit: 2014-05-16 | Discharge: 2014-05-16 | Disposition: A | Discharge status: Home / Self Care | Payer: BLUE CROSS/BLUE SHIELD | Attending: Emergency Medicine | Admitting: Emergency Medicine

## 2014-05-16 DIAGNOSIS — R109 Unspecified abdominal pain: Secondary | ICD-10-CM | POA: Diagnosis present

## 2014-05-16 DIAGNOSIS — Z79899 Other long term (current) drug therapy: Secondary | ICD-10-CM | POA: Diagnosis not present

## 2014-05-16 DIAGNOSIS — F329 Major depressive disorder, single episode, unspecified: Secondary | ICD-10-CM | POA: Insufficient documentation

## 2014-05-16 DIAGNOSIS — J45909 Unspecified asthma, uncomplicated: Secondary | ICD-10-CM | POA: Diagnosis not present

## 2014-05-16 DIAGNOSIS — Z3202 Encounter for pregnancy test, result negative: Secondary | ICD-10-CM | POA: Diagnosis not present

## 2014-05-16 DIAGNOSIS — Z8669 Personal history of other diseases of the nervous system and sense organs: Secondary | ICD-10-CM | POA: Diagnosis not present

## 2014-05-16 DIAGNOSIS — Z87442 Personal history of urinary calculi: Secondary | ICD-10-CM | POA: Diagnosis not present

## 2014-05-16 DIAGNOSIS — N23 Unspecified renal colic: Secondary | ICD-10-CM | POA: Diagnosis not present

## 2014-05-16 DIAGNOSIS — R319 Hematuria, unspecified: Secondary | ICD-10-CM

## 2014-05-16 DIAGNOSIS — F419 Anxiety disorder, unspecified: Secondary | ICD-10-CM | POA: Insufficient documentation

## 2014-05-16 HISTORY — DX: Calculus of kidney: N20.0

## 2014-05-16 LAB — URINE MICROSCOPIC-ADD ON

## 2014-05-16 LAB — URINALYSIS, ROUTINE W REFLEX MICROSCOPIC
BILIRUBIN URINE: NEGATIVE
Glucose, UA: NEGATIVE mg/dL
Ketones, ur: 40 mg/dL — AB
LEUKOCYTES UA: NEGATIVE
Nitrite: NEGATIVE
Protein, ur: NEGATIVE mg/dL
SPECIFIC GRAVITY, URINE: 1.018 (ref 1.005–1.030)
Urobilinogen, UA: 0.2 mg/dL (ref 0.0–1.0)
pH: 6 (ref 5.0–8.0)

## 2014-05-16 LAB — POC URINE PREG, ED: Preg Test, Ur: NEGATIVE

## 2014-05-16 MED ORDER — MORPHINE SULFATE 4 MG/ML IJ SOLN
4.0000 mg | Freq: Once | INTRAMUSCULAR | Status: AC
  Administered 2014-05-16: 4 mg via INTRAVENOUS
  Filled 2014-05-16: qty 1

## 2014-05-16 MED ORDER — ONDANSETRON HCL 4 MG/2ML IJ SOLN
4.0000 mg | Freq: Once | INTRAMUSCULAR | Status: AC
  Administered 2014-05-16: 4 mg via INTRAVENOUS
  Filled 2014-05-16: qty 2

## 2014-05-16 MED ORDER — KETOROLAC TROMETHAMINE 30 MG/ML IJ SOLN
30.0000 mg | Freq: Once | INTRAMUSCULAR | Status: AC
  Administered 2014-05-16: 30 mg via INTRAVENOUS
  Filled 2014-05-16: qty 1

## 2014-05-16 MED ORDER — ONDANSETRON 4 MG PO TBDP
ORAL_TABLET | ORAL | Status: DC
Start: 2014-05-16 — End: 2014-06-16

## 2014-05-16 MED ORDER — OXYCODONE-ACETAMINOPHEN 5-325 MG PO TABS: 1.0000 | ORAL_TABLET | Freq: Four times a day (QID) | ORAL | Status: DC | PRN

## 2014-05-16 MED ORDER — TAMSULOSIN HCL 0.4 MG PO CAPS: 0.4000 mg | ORAL_CAPSULE | Freq: Every day | ORAL | Status: DC

## 2014-05-16 NOTE — ED Notes (Signed)
Pt reports left flank pain since Thursday. Went away then came back today since 5am. Pt reports recent CT scan approx 2 weeks ago that showed nonobstructive stone. Denies fever.

## 2014-05-16 NOTE — ED Provider Notes (Signed)
CSN: [id number]     Arrival date & time 05/16/14  0820 History   First MD Initiated Contact with Patient 05/16/14 [id number]     Chief Complaint  Patient presents with  . Flank Pain     (Consider location/radiation/quality/duration/timing/severity/associated sxs/prior Treatment) HPI Comments: 39 year old female presenting with intermittent L flank pain x 5 days, worsening this morning. Pain returned suddenly at 5:00 AM, radiating around the left side of her abdomen, described as intense. No aggravating or alleviating factors. [facility]19 [facility], results Mild LEFT hydronephrosis and proximal LEFT hydroureter. Questionable 5 mm distal LEFT ureteral calculus versus phlebolith, with distal LEFT ureter poorly localized. Additional BILATERAL nonobstructing renal calculi. Started on cipro at that time. Urinary symptoms have since subsided. Admits to associated nausea with one episode of vomiting. She tried taking hydrocodone, however this made her more nauseous and did not help the pain. Denies fevers.  Patient is a 39 y.o. female presenting with flank pain. The history is provided by the patient.  Flank Pain Associated symptoms include nausea and vomiting.    Past Medical History  Diagnosis Date  . Allergy   . Sleep apnea   . Anxiety   . Depression   . Asthma   . Abortion 03/09/2013  . Pregnancy induced hypertension   . Kidney stone    Past Surgical History  Procedure Laterality Date  . Mouth surgery  1999  . Cervical cerclage      all 3 pregnancies  . Dilation and curettage of uterus  2001    retained placenta  . Dilation and curettage of uterus  2006    SAB  . Tubal ligation     Family History  Problem Relation Age of Onset  . HIV Mother   . Seizures Mother     secondary to drug abuse  . Drug abuse Mother   . Heart disease Maternal Grandfather   . Diabetes Paternal Grandmother   . Cancer Paternal Grandfather    History  Substance Use  Topics  . Smoking status: Never Smoker   . Smokeless tobacco: Never Used  . Alcohol Use: No     Comment: less than once a week-holidays, birthday   OB History    Gravida Para Term Preterm AB TAB SAB Ectopic Multiple Living   7 3 1 2 4 2 2  0 0 1     Review of Systems  Gastrointestinal: Positive for nausea and vomiting.  Genitourinary: Positive for flank pain.  All other systems reviewed and are negative.     Allergies  Stadol and Kale  Home Medications   Prior to Admission medications   Medication Sig Start Date End Date Taking? Authorizing Provider  citalopram (CELEXA) 20 MG tablet Take 20 mg by mouth daily.   Yes Historical Provider, MD  IRON PO Take 1 tablet by mouth daily.    Yes Historical Provider, MD  ondansetron (ZOFRAN ODT) 4 MG disintegrating tablet Take 1 tablet (4 mg total) by mouth every 8 (eight) hours as needed for nausea or vomiting. 11/10/13  Yes Armando ReichertHeather D Hogan, CNM  traZODone (DESYREL) 50 MG tablet Take 25 mg by mouth at bedtime as needed for sleep.   Yes Historical Provider, MD  acetaminophen (TYLENOL) 325 MG tablet Take 650 mg by mouth every 6 (six) hours as needed for moderate pain.    Historical Provider, MD  ciprofloxacin (CIPRO) 500 MG tablet Take 1 tablet (500 mg total) by mouth  2 (two) times daily. Patient not taking: Reported on 05/16/2014 04/19/14   Carmelina Dane, MD  ciprofloxacin (CIPRO) 500 MG tablet Take 1 tablet (500 mg total) by mouth 2 (two) times daily. Patient not taking: Reported on 05/16/2014 04/19/14   Carmelina Dane, MD  HYDROcodone-acetaminophen [facility]) 5-325 MG per tablet Take 1-2 tablets by mouth every 4 (four) hours as needed. Patient taking differently: Take 1-2 tablets by mouth every 4 (four) hours as needed for moderate pain or severe pain.  04/19/14   Carmelina Dane, MD  ondansetron (ZOFRAN ODT) 4 MG disintegrating tablet  ODT q4 hours prn nausea/vomit 05/16/14   Borna Wessinger M Ashar Lewinski, PA-C  oxyCODONE-acetaminophen (PERCOCET) 5-325  MG per tablet Take 1-2 tablets by mouth every 6 (six) hours as needed for severe pain. 05/16/14   Kathrynn Speed, PA-C  promethazine (PHENERGAN) 25 MG suppository Place 1 suppository (25 mg total) rectally every 6 (six) hours as needed for nausea or vomiting. Patient not taking: Reported on 04/19/2014 11/10/13   Armando Reichert, CNM  tamsulosin (FLOMAX) 0.4 MG CAPS capsule Take 1 capsule (0.4 mg total) by mouth daily. 05/16/14   Leandria Thier M Oden Lindaman, PA-C   BP 120/86 mmHg  Pulse 55  Temp(Src) 97.9 F (36.6 C) (Oral)  Resp 17  SpO2 100%  LMP 04/19/2014 Physical Exam  Constitutional: She is oriented to person, place, and time. She appears well-developed and well-nourished. No distress.  Uncomfortable but in NAD.  HENT:  Head: Normocephalic and atraumatic.  Mouth/Throat: Oropharynx is clear and moist.  Eyes: Conjunctivae are normal.  Neck: Normal range of motion. Neck supple.  Cardiovascular: Normal rate, regular rhythm and normal heart sounds.   Pulmonary/Chest: Effort normal and breath sounds normal.  Abdominal: Soft. Normal appearance and bowel sounds are normal. There is no rigidity, no rebound and no guarding.    L CVAT.  Musculoskeletal: Normal range of motion. She exhibits no edema.  Neurological: She is alert and oriented to person, place, and time.  Skin: Skin is warm and dry. She is not diaphoretic.  Psychiatric: She has a normal mood and affect. Her behavior is normal.    ED Course  Procedures (including critical care time) Labs Review Labs Reviewed  URINALYSIS, ROUTINE W REFLEX MICROSCOPIC - Abnormal; Notable for the following:    APPearance CLOUDY (*)    Hgb urine dipstick LARGE (*)    Ketones, ur 40 (*)    All other components within normal limits  URINE CULTURE  URINE MICROSCOPIC-ADD ON  POC URINE PREG, ED    Imaging Review No results found.   EKG Interpretation None      MDM   Final diagnoses:  Left flank pain  Hematuria  Ureteral colic   Uncomfortable but  in no apparent distress. Afebrile, VSS. CT scan from last month with results as stated in HPI. Symptoms most likely from ureteral colic from moving stone. I do not feel repeat CT is necessary at this time. Urinalysis positive for large amount of blood, negative for infection. Pain significantly improved with Toradol and morphine. No longer nauseated. Tolerates PO. Will discharge home with urine strainer, Percocet, Zofran and Flomax. Follow-up with urology. Stable for discharge. Return precautions given. Patient states understanding of treatment care plan and is agreeable.  Kathrynn Speed, PA-C 05/16/14 1039  Gerhard Munch, MD 05/17/14 [phone]

## 2014-05-16 NOTE — Discharge Instructions (Signed)
Take percocet for severe pain only. No driving or operating heavy machinery while taking percocet. This medication may cause drowsiness. Take zofran as directed as needed for nausea. Stay well hydrated. Strain all urine. Follow up with urology. Take Flomax as prescribed.  Flank Pain Flank pain refers to pain that is located on the side of the body between the upper abdomen and the back. The pain may occur over a short period of time (acute) or may be long-term or reoccurring (chronic). It may be mild or severe. Flank pain can be caused by many things. CAUSES  Some of the more common causes of flank pain include:  Muscle strains.   Muscle spasms.   A disease of your spine (vertebral disk disease).   A lung infection (pneumonia).   Fluid around your lungs (pulmonary edema).   A kidney infection.   Kidney stones.   A very painful skin rash caused by the chickenpox virus (shingles).   Gallbladder disease.  HOME CARE INSTRUCTIONS  Home care will depend on the cause of your pain. In general,  Rest as directed by your caregiver.  Drink enough fluids to keep your urine clear or pale yellow.  Only take over-the-counter or prescription medicines as directed by your caregiver. Some medicines may help relieve the pain.  Tell your caregiver about any changes in your pain.  Follow up with your caregiver as directed. SEEK IMMEDIATE MEDICAL CARE IF:   Your pain is not controlled with medicine.   You have new or worsening symptoms.  Your pain increases.   You have abdominal pain.   You have shortness of breath.   You have persistent nausea or vomiting.   You have swelling in your abdomen.   You feel faint or pass out.   You have blood in your urine.  You have a fever or persistent symptoms for more than 2-3 days.  You have a fever and your symptoms suddenly get worse. MAKE SURE YOU:   Understand these instructions.  Will watch your condition.  Will get  help right away if you are not doing well or get worse. Document Released: 04/11/2005 Document Revised: 11/13/2011 Document Reviewed: 10/03/2011 [facility] Patient Information 2015 [place], [place]. This information is not intended to replace advice given to you by your health care provider. Make sure you discuss any questions you have with your health care provider.  Ureteral Colic (Kidney Stones) Ureteral colic is the result of a condition when kidney stones form inside the kidney. Once kidney stones are formed they may move into the tube that connects the kidney with the bladder (ureter). If this occurs, this condition may cause pain (colic) in the ureter.  CAUSES  Pain is caused by stone movement in the ureter and the obstruction caused by the stone. SYMPTOMS  The pain comes and goes as the ureter contracts around the stone. The pain is usually intense, sharp, and stabbing in character. The location of the pain may move as the stone moves through the ureter. When the stone is near the kidney the pain is usually located in the back and radiates to the belly (abdomen). When the stone is ready to pass into the bladder the pain is often located in the lower abdomen on the side the stone is located. At this location, the symptoms may mimic those of a urinary tract infection with urinary frequency. Once the stone is located here it often passes into the bladder and the pain disappears completely. TREATMENT   Your  caregiver will provide you with medicine for pain relief.  You may require specialized follow-up X-rays.  The absence of pain does not always mean that the stone has passed. It may have just stopped moving. If the urine remains completely obstructed, it can cause loss of kidney function or even complete destruction of the involved kidney. It is your responsibility and in your interest that X-rays and follow-ups as suggested by your caregiver are completed. Relief of pain without passage of the  stone can be associated with severe damage to the kidney, including loss of kidney function on that side.  If your stone does not pass on its own, additional measures may be taken by your caregiver to ensure its removal. HOME CARE INSTRUCTIONS   Increase your fluid intake. Water is the preferred fluid since juices containing vitamin C may acidify the urine making it less likely for certain stones (uric acid stones) to pass.  Strain all urine. A strainer will be provided. Keep all particulate matter or stones for your caregiver to inspect.  Take your pain medicine as directed.  Make a follow-up appointment with your caregiver as directed.  Remember that the goal is passage of your stone. The absence of pain does not mean the stone is gone. Follow your caregiver's instructions.  Only take over-the-counter or prescription medicines for pain, discomfort, or fever as directed by your caregiver. SEEK MEDICAL CARE IF:   Pain cannot be controlled with the prescribed medicine.  You have a fever.  Pain continues for longer than your caregiver advises it should.  There is a change in the pain, and you develop chest discomfort or constant abdominal pain.  You feel faint or pass out. MAKE SURE YOU:   Understand these instructions.  Will watch your condition.  Will get help right away if you are not doing well or get worse. Document Released: 11/28/2004 Document Revised: 06/15/2012 Document Reviewed: 08/15/2010 [facility] Patient Information 2015 [place], [place]. This information is not intended to replace advice given to you by your health care provider. Make sure you discuss any questions you have with your health care provider.

## 2014-05-17 LAB — URINE CULTURE

## 2014-06-08 ENCOUNTER — Encounter (HOSPITAL_COMMUNITY): Payer: Self-pay | Admitting: Emergency Medicine

## 2014-06-08 ENCOUNTER — Emergency Department (HOSPITAL_COMMUNITY)
Admission: EM | Admit: 2014-06-08 | Discharge: 2014-06-08 | Disposition: A | Discharge status: Home / Self Care | Payer: BLUE CROSS/BLUE SHIELD | Attending: Emergency Medicine | Admitting: Emergency Medicine

## 2014-06-08 ENCOUNTER — Emergency Department (HOSPITAL_COMMUNITY): Payer: BLUE CROSS/BLUE SHIELD

## 2014-06-08 DIAGNOSIS — Y998 Other external cause status: Secondary | ICD-10-CM | POA: Diagnosis not present

## 2014-06-08 DIAGNOSIS — Y9289 Other specified places as the place of occurrence of the external cause: Secondary | ICD-10-CM | POA: Insufficient documentation

## 2014-06-08 DIAGNOSIS — W010XXA Fall on same level from slipping, tripping and stumbling without subsequent striking against object, initial encounter: Secondary | ICD-10-CM | POA: Diagnosis not present

## 2014-06-08 DIAGNOSIS — F329 Major depressive disorder, single episode, unspecified: Secondary | ICD-10-CM | POA: Diagnosis not present

## 2014-06-08 DIAGNOSIS — S4991XA Unspecified injury of right shoulder and upper arm, initial encounter: Secondary | ICD-10-CM | POA: Insufficient documentation

## 2014-06-08 DIAGNOSIS — Z87442 Personal history of urinary calculi: Secondary | ICD-10-CM | POA: Insufficient documentation

## 2014-06-08 DIAGNOSIS — Y93E2 Activity, laundry: Secondary | ICD-10-CM | POA: Diagnosis not present

## 2014-06-08 DIAGNOSIS — F419 Anxiety disorder, unspecified: Secondary | ICD-10-CM | POA: Insufficient documentation

## 2014-06-08 DIAGNOSIS — Z79899 Other long term (current) drug therapy: Secondary | ICD-10-CM | POA: Diagnosis not present

## 2014-06-08 DIAGNOSIS — M25511 Pain in right shoulder: Secondary | ICD-10-CM

## 2014-06-08 DIAGNOSIS — J45909 Unspecified asthma, uncomplicated: Secondary | ICD-10-CM | POA: Insufficient documentation

## 2014-06-08 DIAGNOSIS — Z8669 Personal history of other diseases of the nervous system and sense organs: Secondary | ICD-10-CM | POA: Insufficient documentation

## 2014-06-08 MED ORDER — IBUPROFEN 800 MG PO TABS: 800.0000 mg | ORAL_TABLET | Freq: Three times a day (TID) | ORAL | Status: DC

## 2014-06-08 NOTE — ED Notes (Signed)
Patient states slipped and fell on laundry and landed on R arm.   Patient states upper arm pain 9/10.   Patient states she hasn't taken anything for pain at home.  Denies other symptoms.

## 2014-06-08 NOTE — ED Provider Notes (Signed)
[name]     Arrival date & time 06/08/14  4098 History  This chart was scribed for non-physician practitioner, Jinny Sanders, PA-C, working with Donnetta Hutching, MD by Charline Bills, ED Scribe. This patient was seen in room TR08C/TR08C and the patient's care was started at 11:10 AM.   Chief Complaint  Patient presents with  . Fall   The history is provided by the patient. No language interpreter was used.   HPI Comments: Morgan [name] is a 39 y.o. female, with a h/o asthma, anxiety, depression, who presents to the Emergency Department complaining of a slip and fall that occurred PTA. Pt was doing laundry when she slipped on a sheet and landed on her R shoulder. She reports that pain radiates down into her upper arm and is exacerbated with movement. Patient denies numbness, tingling, loss of sensation or function.  Past Medical History  Diagnosis Date  . Allergy   . Sleep apnea   . Anxiety   . Depression   . Asthma   . Abortion 03/09/2013  . Pregnancy induced hypertension   . Kidney stone    Past Surgical History  Procedure Laterality Date  . Mouth surgery  1999  . Cervical cerclage      all 3 pregnancies  . Dilation and curettage of uterus  2001    retained placenta  . Dilation and curettage of uterus  2006    SAB  . Tubal ligation     Family History  Problem Relation Age of Onset  . HIV Mother   . Seizures Mother     secondary to drug abuse  . Drug abuse Mother   . Heart disease Maternal Grandfather   . Diabetes Paternal Grandmother   . Cancer Paternal Grandfather    History  Substance Use Topics  . Smoking status: Never Smoker   . Smokeless tobacco: Never Used  . Alcohol Use: No     Comment: less than once a week-holidays, birthday   OB History    Gravida Para Term Preterm AB TAB SAB Ectopic Multiple Living   0 0 1     Review of Systems  Musculoskeletal: Positive for arthralgias.  Neurological: Negative for weakness and numbness.    Allergies  Stadol and Kale  Home Medications   Prior to Admission medications   Medication Sig Start Date End Date Taking? Authorizing Provider  acetaminophen (TYLENOL) 325 MG tablet Take 650 mg by mouth every 6 (six) hours as needed for moderate pain.    Historical Provider, MD  ciprofloxacin (CIPRO) 500 MG tablet Take 1 tablet (500 mg total) by mouth 2 (two) times daily. Patient not taking: Reported on 05/16/2014 04/19/14   Carmelina Dane, MD  ciprofloxacin (CIPRO) 500 MG tablet Take 1 tablet (500 mg total) by mouth 2 (two) times daily. Patient not taking: Reported on 05/16/2014 04/19/14   Carmelina Dane, MD  citalopram (CELEXA) 20 MG tablet Take 20 mg by mouth daily.    Historical Provider, MD  HYDROcodone-acetaminophen (NORCO) 5-325 MG per tablet Take 1-2 tablets by mouth every 4 (four) hours as needed. Patient taking differently: Take 1-2 tablets by mouth every 4 (four) hours as needed for moderate pain or severe pain.  04/19/14   Carmelina Dane, MD  ibuprofen (ADVIL,MOTRIN) 800 MG tablet Take 1 tablet (800 mg total) by mouth 3 (three) times daily. 06/08/14   Ladona Mow, PA-C  IRON PO Take 1 tablet by mouth daily.  Historical Provider, MD  ondansetron (ZOFRAN ODT) 4 MG disintegrating tablet Take 1 tablet (4 mg total) by mouth every 8 (eight) hours as needed for nausea or vomiting. 11/10/13   Armando ReichertHeather D Hogan, CNM  ondansetron (ZOFRAN ODT) 4 MG disintegrating tablet 4mg  ODT q4 hours prn nausea/vomit 05/16/14   Robyn M Hess, PA-C  oxyCODONE-acetaminophen (PERCOCET) 5-325 MG per tablet Take 1-2 tablets by mouth every 6 (six) hours as needed for severe pain. 05/16/14   Kathrynn Speedobyn M Hess, PA-C  promethazine (PHENERGAN) 25 MG suppository Place 1 suppository (25 mg total) rectally every 6 (six) hours as needed for nausea or vomiting. Patient not taking: Reported on 04/19/2014 11/10/13   Armando ReichertHeather D Hogan, CNM  tamsulosin (FLOMAX) 0.4 MG CAPS capsule Take 1 capsule (0.4 mg total) by mouth daily.  05/16/14   Kathrynn Speedobyn M Hess, PA-C  traZODone (DESYREL) 50 MG tablet Take 25 mg by mouth at bedtime as needed for sleep.    Historical Provider, MD   BP 132/87 mmHg  Pulse 79  Temp(Src) 97.7 F (36.5 C) (Oral)  Resp 18  SpO2 100% Physical Exam  Constitutional: She is oriented to person, place, and time. She appears well-developed and well-nourished. No distress.  HENT:  Head: Normocephalic and atraumatic.  Eyes: Conjunctivae and EOM are normal. Right eye exhibits no discharge. Left eye exhibits no discharge. No scleral icterus.  Neck: Neck supple. No tracheal deviation present.  Cardiovascular: Normal rate.   Pulses:      Radial pulses are 2+ on the right side.  Peripheral pulses intact at injured extremity.   Pulmonary/Chest: Effort normal. No respiratory distress.  Musculoskeletal: Normal range of motion.  Tenderness to palpation of lateral acromial region on the R. no obvious erythema, ecchymosis, deformity noted. Patient has limited range of motion of shoulder due to pain, however has 5 out of 5 motor strength at shoulder, elbow, wrist, grip strength. Radial pulse 2+. Distal sensation intact. Capillary refill less than 2 seconds.  Neurological: She is alert and oriented to person, place, and time.  No numbness distal to injury.    Skin: Skin is warm and dry. No rash noted. She is not diaphoretic.  Psychiatric: She has a normal mood and affect. Her behavior is normal.  Nursing note and vitals reviewed.  ED Course  Procedures (including critical care time) DIAGNOSTIC STUDIES: Oxygen Saturation is 100% on RA, normal by my interpretation.    COORDINATION OF CARE: 11:13 AM-Discussed treatment plan which includes XR with pt at bedside and pt agreed to plan.  Labs Review Labs Reviewed - No data to display  Imaging Review Dg Shoulder Right  06/08/2014   CLINICAL DATA:  Status post fall today. Right shoulder pain. Initial encounter.  EXAM: RIGHT SHOULDER - 2+ VIEW  COMPARISON:  None.   FINDINGS: Imaged bones, joints and soft tissues appear normal.  IMPRESSION: Negative exam.   Electronically Signed   By: Drusilla Kannerhomas  Dalessio M.D.   On: 06/08/2014 12:50   Dg Humerus Right  06/08/2014   CLINICAL DATA:  Pain following fall 1 day prior  EXAM: RIGHT HUMERUS - 2+ VIEW  COMPARISON:  None.  FINDINGS: Frontal and lateral views were obtained. No fracture or dislocation. Joint spaces appear intact. No erosive change.  IMPRESSION: No fracture or dislocation.  No appreciable arthropathy.   Electronically Signed   By: Bretta BangWilliam  Woodruff III M.D.   On: 06/08/2014 11:50    EKG Interpretation None      MDM   Final diagnoses:  Right shoulder pain    Patient here with shoulder pain after a fall area did x-rays unremarkable for acute pathology. Likely patient has muscular or soft tissue injury. Patient is neurovascularly intact. We'll provide patient with sling, encouraged RICE therapy. Provided patient with follow-up with orthopedics should her symptoms worsen or persist. Discussed return precautions with patient, patient verbalizes understanding and agreement of this plan. I encouraged patient to call or return to the ER with any worsening of symptoms or should she have any questions or concerns.  I personally performed the services described in this documentation, which was scribed in my presence. The recorded information has been reviewed and is accurate.  BP 122/79 mmHg  Pulse 74  Temp(Src) 97.9 F (36.6 C) (Oral)  Resp 16  SpO2 100%  LMP 04/19/2014 (Exact Date)  Signed,  Ladona Mow, PA-C 5:56 PM    Ladona Mow, PA-C 06/08/14 1757  Donnetta Hutching, MD 06/09/14 1030

## 2014-06-08 NOTE — Discharge Instructions (Signed)
Acromioclavicular Injuries °The AC (acromioclavicular) joint is the joint in the shoulder where the collarbone (clavicle) meets the shoulder blade (scapula). The part of the shoulder blade connected to the collarbone is called the acromion. Common problems with and treatments for the AC joint are detailed below. °ARTHRITIS °Arthritis occurs when the joint has been injured and the smooth padding between the joints (cartilage) is lost. This is the wear and tear seen in most joints of the body if they have been overused. This causes the joint to produce pain and swelling which is worse with activity.  °AC JOINT SEPARATION °AC joint separation means that the ligaments connecting the acromion of the shoulder blade and collarbone have been damaged, and the two bones no longer line up. AC separations can be anywhere from mild to severe, and are "graded" depending upon which ligaments are torn and how badly they are torn. °· Grade I Injury: the least damage is done, and the AC joint still lines up. °· Grade II Injury: damage to the ligaments which reinforce the AC joint. In a Grade II injury, these ligaments are stretched but not entirely torn. When stressed, the AC joint becomes painful and unstable. °· Grade III Injury: AC and secondary ligaments are completely torn, and the collarbone is no longer attached to the shoulder blade. This results in deformity; a prominence of the end of the clavicle. °AC JOINT FRACTURE °AC joint fracture means that there has been a break in the bones of the AC joint, usually the end of the clavicle. °TREATMENT °TREATMENT OF AC ARTHRITIS °· There is currently no way to replace the cartilage damaged by arthritis. The best way to improve the condition is to decrease the activities which aggravate the problem. Application of ice to the joint helps decrease pain and soreness (inflammation). The use of non-steroidal anti-inflammatory medication is helpful. °· If less conservative measures do not  work, then cortisone shots (injections) may be used. These are anti-inflammatories; they decrease the soreness in the joint and swelling. °· If non-surgical measures fail, surgery may be recommended. The procedure is generally removal of a portion of the end of the clavicle. This is the part of the collarbone closest to your acromion which is stabilized with ligaments to the acromion of the shoulder blade. This surgery may be performed using a tube-like instrument with a light (arthroscope) for looking into a joint. It may also be performed as an open surgery through a small incision by the surgeon. Most patients will have good range of motion within 6 weeks and may return to all activity including sports by 8-12 weeks, barring complications. °TREATMENT OF AN AC SEPARATION °· The initial treatment is to decrease pain. This is best accomplished by immobilizing the arm in a sling and placing an ice pack to the shoulder for 20 to 30 minutes every 2 hours as needed. As the pain starts to subside, it is important to begin moving the fingers, wrist, elbow and eventually the shoulder in order to prevent a stiff or "frozen" shoulder. Instruction on when and how much to move the shoulder will be provided by your caregiver. The length of time needed to regain full motion and function depends on the amount or grade of the injury. Recovery from a Grade I AC separation usually takes 10 to 14 days, whereas a Grade III may take 6 to 8 weeks. °· Grade I and II separations usually do not require surgery. Even Grade III injuries usually allow return to full   activity with few restrictions. Treatment is also based on the activity demands of the injured shoulder. For example, a high level quarterback with an injured throwing arm will receive more aggressive treatment than someone with a desk job who rarely uses his/her arm for strenuous activities. In some cases, a painful lump may persist which could require a later surgery. Surgery  can be very successful, but the benefits must be weighed against the potential risks. °TREATMENT OF AN AC JOINT FRACTURE °Fracture treatment depends on the type of fracture. Sometimes a splint or sling may be all that is required. Other times surgery may be required for repair. This is more frequently the case when the ligaments supporting the clavicle are completely torn. Your caregiver will help you with these decisions and together you can decide what will be the best treatment. °HOME CARE INSTRUCTIONS  °· Apply ice to the injury for 15-20 minutes each hour while awake for 2 days. Put the ice in a plastic bag and place a towel between the bag of ice and skin. °· If a sling has been applied, wear it constantly for as long as directed by your caregiver, even at night. The sling or splint can be removed for bathing or showering or as directed. Be sure to keep the shoulder in the same place as when the sling is on. Do not lift the arm. °· If a figure-of-eight splint has been applied it should be tightened gently by another person every day. Tighten it enough to keep the shoulders held back. Allow enough room to place the index finger between the body and strap. Loosen the splint immediately if there is numbness or tingling in the hands. °· Take over-the-counter or prescription medicines for pain, discomfort or fever as directed by your caregiver. °· If you or your child has received a follow up appointment, it is very important to keep that appointment in order to avoid long term complications, chronic pain or disability. °SEEK MEDICAL CARE IF:  °· The pain is not relieved with medications. °· There is increased swelling or discoloration that continues to get worse rather than better. °· You or your child has been unable to follow up as instructed. °· There is progressive numbness and tingling in the arm, forearm or hand. °SEEK IMMEDIATE MEDICAL CARE IF:  °· The arm is numb, cold or pale. °· There is increasing pain  in the hand, forearm or fingers. °MAKE SURE YOU:  °· Understand these instructions. °· Will watch your condition. °· Will get help right away if you are not doing well or get worse. °Document Released: 11/28/2004 Document Revised: 05/13/2011 Document Reviewed: 05/23/2008 °[employer]® Patient Information ©2015 [employer]. This information is not intended to replace advice given to you by your health care provider. Make sure you discuss any questions you have with your health care provider. ° °

## 2014-06-16 ENCOUNTER — Ambulatory Visit (INDEPENDENT_AMBULATORY_CARE_PROVIDER_SITE_OTHER): Payer: BLUE CROSS/BLUE SHIELD

## 2014-06-16 ENCOUNTER — Ambulatory Visit (INDEPENDENT_AMBULATORY_CARE_PROVIDER_SITE_OTHER): Payer: BLUE CROSS/BLUE SHIELD | Admitting: Family Medicine

## 2014-06-16 VITALS — BP 110/70 | HR 76 | Temp 98.4°F | Ht 65.0 in | Wt 185.1 lb

## 2014-06-16 DIAGNOSIS — M722 Plantar fascial fibromatosis: Secondary | ICD-10-CM | POA: Diagnosis not present

## 2014-06-16 DIAGNOSIS — M79672 Pain in left foot: Secondary | ICD-10-CM

## 2014-06-16 MED ORDER — MELOXICAM 15 MG PO TABS: 15.0000 mg | ORAL_TABLET | Freq: Every day | ORAL | Status: DC

## 2014-06-16 NOTE — Progress Notes (Signed)
Patient ID: Morgan [name], female   DOB: 01/02/1976, 39 y.o.   MRN: [id number]   Subjective:  This chart was scribed for Nilda SimmerKristi Renne Cornick, MD by [facility] CenterNadim Abu Hashem, medical scribe at [facility].The patient was seen in exam room 13 and the patient's care was started at 9:10 PM.   Patient ID: Morgan [name], female    DOB: 11/03/1975, 39 y.o.   MRN: [id number]  06/16/2014  Foot Pain  HPI HPI Comments: Morgan [name] is a 39 y.o. female who presents to [facility] complaining of left foot/heel pain, ongoing for 2 weeks. The pain this week has worsened and is now sharp, her pain is typically dull. Pt has pain with bearing weight. Wears an old pair of boots/shoes to work. Pt states she does have pain when first getting out of bed in the morning. She has not taken anything for pain. Pt denies trauma. She works at [employer] and cannot sit. No chance of pregnancy.  Pt stands on concrete floor all day.  Has missed work for past four days due to pain.  Review of Systems  Constitutional: Negative for fever, chills, diaphoresis and fatigue.  HENT: Negative for ear pain, postnasal drip, rhinorrhea, sinus pressure, sore throat and trouble swallowing.   Respiratory: Negative for cough and shortness of breath.   Cardiovascular: Negative for chest pain, palpitations and leg swelling.  Gastrointestinal: Negative for nausea, vomiting, abdominal pain, diarrhea and constipation.  Musculoskeletal: Positive for arthralgias and gait problem. Negative for joint swelling.  Skin: Negative for color change, pallor, rash and wound.    Past Medical History  Diagnosis Date  . Allergy   . Sleep apnea   . Anxiety   . Depression   . Asthma   . Abortion 03/09/2013  . Pregnancy induced hypertension   . Kidney stone    Past Surgical History  Procedure Laterality Date  . Mouth surgery  1999  . Cervical cerclage      all 3 pregnancies  . Dilation and curettage of uterus  2001   retained placenta  . Dilation and curettage of uterus  2006    SAB  . Tubal ligation     Allergies  Allergen Reactions  . Stadol [Butorphanol Tartrate] Itching  . Kale Itching and Rash   Current Outpatient Prescriptions  Medication Sig Dispense Refill  . ibuprofen (ADVIL,MOTRIN) 800 MG tablet Take 1 tablet (800 mg total) by mouth 3 (three) times daily. 21 tablet 0  . IRON PO Take 1 tablet by mouth daily.     . traZODone (DESYREL) 50 MG tablet Take 25 mg by mouth at bedtime as needed for sleep.    . meloxicam (MOBIC) 15 MG tablet Take 1 tablet (15 mg total) by mouth daily. 30 tablet 0   No current facility-administered medications for this visit.      Objective:    BP 110/70 mmHg  Pulse 76  Temp(Src) 98.4 F (36.9 C) (Oral)  Ht 5\' 5"  (1.651 m)  Wt 185 lb 2 oz (83.972 kg)  BMI 30.81 kg/m2  SpO2 98%  LMP 06/11/2014  Breastfeeding? No Physical Exam  Constitutional: She is oriented to person, place, and time. She appears well-developed and well-nourished. No distress.  HENT:  Head: Normocephalic and atraumatic.  Eyes: Conjunctivae and EOM are normal. Pupils are equal, round, and reactive to light.  Neck: Normal range of motion. Neck supple. Carotid bruit is not present.  Cardiovascular: Intact distal pulses.   Pulmonary/Chest: Effort  normal.  Abdominal: Soft. Bowel sounds are normal. She exhibits no distension and no mass. There is no tenderness. There is no rebound and no guarding.  Musculoskeletal:       Left ankle: Normal. She exhibits normal range of motion, no swelling and no ecchymosis. No tenderness. No lateral malleolus and no medial malleolus tenderness found. Achilles tendon normal. Achilles tendon exhibits no pain.       Left lower leg: Normal. She exhibits no tenderness, no bony tenderness and no swelling.       Left foot: There is tenderness and bony tenderness. There is normal range of motion, no swelling and normal capillary refill.  No calf or achilles  tenderness. TTP of the plantar fascia inserta and calcaneous. No metatarsal tenderness. Full ROM of her left ankle without pain.  Neurological: She is alert and oriented to person, place, and time. No cranial nerve deficit.  Skin: Skin is warm and dry. No rash noted. She is not diaphoretic. No erythema. No pallor.  Psychiatric: She has a normal mood and affect. Her behavior is normal.   [facility] reading (PRIMARY) by  Dr. Katrinka Blazing.  L FOOT FILMS: NAD.      Assessment & Plan:   1. Heel pain, left   2. Plantar fasciitis of left foot     -New. -rx for Meloxicam  daily provided. -Ice heel bid for 15-20 minutes each session. -Due to nature of work, refer to podiatry. -Home exercise program provided to perform daily. -Heel cups in work shoes; avoid walking barefooted or with flip flops.   Meds ordered this encounter  Medications  . meloxicam (MOBIC) 15 MG tablet    Sig: Take 1 tablet (15 mg total) by mouth daily.    Dispense:  30 tablet    Refill:  0    No Follow-up on file.   I personally performed the services described in this documentation, which was scribed in my presence. The recorded information has been reviewed and considered.  Sedalia Greeson Paulita Fujita, M.D. [facility] [address] [place], [place]  [address] [phone] phone [phone] fax

## 2014-06-16 NOTE — Patient Instructions (Signed)
1. Take Tylenol as needed for pain. 2. Take Meloxicam one daily for inflammation and pain.  (Do NOT take Ibuprofen while taking Meloxicam). 3. Ice foot twice daily. 4.  Do not walk barefooted.  Always wear supportive shoes.  No flip flops.   Plantar Fasciitis (Heel Spur Syndrome) with Rehab The plantar fascia is a fibrous, ligament-like, soft-tissue structure that spans the bottom of the foot. Plantar fasciitis is a condition that causes pain in the foot due to inflammation of the tissue. SYMPTOMS   Pain and tenderness on the underneath side of the foot.  Pain that worsens with standing or walking. CAUSES  Plantar fasciitis is caused by irritation and injury to the plantar fascia on the underneath side of the foot. Common mechanisms of injury include:  Direct trauma to bottom of the foot.  Damage to a small nerve that runs under the foot where the main fascia attaches to the heel bone.  Stress placed on the plantar fascia due to bone spurs. RISK INCREASES WITH:   Activities that place stress on the plantar fascia (running, jumping, pivoting, or cutting).  Poor strength and flexibility.  Improperly fitted shoes.  Tight calf muscles.  Flat feet.  Failure to warm-up properly before activity.  Obesity. PREVENTION  Warm up and stretch properly before activity.  Allow for adequate recovery between workouts.  Maintain physical fitness:  Strength, flexibility, and endurance.  Cardiovascular fitness.  Maintain a health body weight.  Avoid stress on the plantar fascia.  Wear properly fitted shoes, including arch supports for individuals who have flat feet. PROGNOSIS  If treated properly, then the symptoms of plantar fasciitis usually resolve without surgery. However, occasionally surgery is necessary. RELATED COMPLICATIONS   Recurrent symptoms that may result in a chronic condition.  Problems of the lower back that are caused by compensating for the injury, such as  limping.  Pain or weakness of the foot during push-off following surgery.  Chronic inflammation, scarring, and partial or complete fascia tear, occurring more often from repeated injections. TREATMENT  Treatment initially involves the use of ice and medication to help reduce pain and inflammation. The use of strengthening and stretching exercises may help reduce pain with activity, especially stretches of the Achilles tendon. These exercises may be performed at home or with a therapist. Your caregiver may recommend that you use heel cups of arch supports to help reduce stress on the plantar fascia. Occasionally, corticosteroid injections are given to reduce inflammation. If symptoms persist for greater than 6 months despite non-surgical (conservative), then surgery may be recommended.  MEDICATION   If pain medication is necessary, then nonsteroidal anti-inflammatory medications, such as aspirin and ibuprofen, or other minor pain relievers, such as acetaminophen, are often recommended.  Do not take pain medication within 7 days before surgery.  Prescription pain relievers may be given if deemed necessary by your caregiver. Use only as directed and only as much as you need.  Corticosteroid injections may be given by your caregiver. These injections should be reserved for the most serious cases, because they may only be given a certain number of times. HEAT AND COLD  Cold treatment (icing) relieves pain and reduces inflammation. Cold treatment should be applied for 10 to 15 minutes every 2 to 3 hours for inflammation and pain and immediately after any activity that aggravates your symptoms. Use ice packs or massage the area with a piece of ice (ice massage).  Heat treatment may be used prior to performing the stretching and strengthening  activities prescribed by your caregiver, physical therapist, or athletic trainer. Use a heat pack or soak the injury in warm water. SEEK IMMEDIATE MEDICAL CARE  IF:  Treatment seems to offer no benefit, or the condition worsens.  Any medications produce adverse side effects. EXERCISES RANGE OF MOTION (ROM) AND STRETCHING EXERCISES - Plantar Fasciitis (Heel Spur Syndrome) These exercises may help you when beginning to rehabilitate your injury. Your symptoms may resolve with or without further involvement from your physician, physical therapist or athletic trainer. While completing these exercises, remember:   Restoring tissue flexibility helps normal motion to return to the joints. This allows healthier, less painful movement and activity.  An effective stretch should be held for at least 30 seconds.  A stretch should never be painful. You should only feel a gentle lengthening or release in the stretched tissue. RANGE OF MOTION - Toe Extension, Flexion  Sit with your right / left leg crossed over your opposite knee.  Grasp your toes and gently pull them back toward the top of your foot. You should feel a stretch on the bottom of your toes and/or foot.  Hold this stretch for __________ seconds.  Now, gently pull your toes toward the bottom of your foot. You should feel a stretch on the top of your toes and or foot.  Hold this stretch for __________ seconds. Repeat __________ times. Complete this stretch __________ times per day.  RANGE OF MOTION - Ankle Dorsiflexion, Active Assisted  Remove shoes and sit on a chair that is preferably not on a carpeted surface.  Place right / left foot under knee. Extend your opposite leg for support.  Keeping your heel down, slide your right / left foot back toward the chair until you feel a stretch at your ankle or calf. If you do not feel a stretch, slide your bottom forward to the edge of the chair, while still keeping your heel down.  Hold this stretch for __________ seconds. Repeat __________ times. Complete this stretch __________ times per day.  STRETCH - Gastroc, Standing  Place hands on  wall.  Extend right / left leg, keeping the front knee somewhat bent.  Slightly point your toes inward on your back foot.  Keeping your right / left heel on the floor and your knee straight, shift your weight toward the wall, not allowing your back to arch.  You should feel a gentle stretch in the right / left calf. Hold this position for __________ seconds. Repeat __________ times. Complete this stretch __________ times per day. STRETCH - Soleus, Standing  Place hands on wall.  Extend right / left leg, keeping the other knee somewhat bent.  Slightly point your toes inward on your back foot.  Keep your right / left heel on the floor, bend your back knee, and slightly shift your weight over the back leg so that you feel a gentle stretch deep in your back calf.  Hold this position for __________ seconds. Repeat __________ times. Complete this stretch __________ times per day. STRETCH - Gastrocsoleus, Standing  Note: This exercise can place a lot of stress on your foot and ankle. Please complete this exercise only if specifically instructed by your caregiver.   Place the ball of your right / left foot on a step, keeping your other foot firmly on the same step.  Hold on to the wall or a rail for balance.  Slowly lift your other foot, allowing your body weight to press your heel down over the edge  of the step.  You should feel a stretch in your right / left calf.  Hold this position for __________ seconds.  Repeat this exercise with a slight bend in your right / left knee. Repeat __________ times. Complete this stretch __________ times per day.  STRENGTHENING EXERCISES - Plantar Fasciitis (Heel Spur Syndrome)  These exercises may help you when beginning to rehabilitate your injury. They may resolve your symptoms with or without further involvement from your physician, physical therapist or athletic trainer. While completing these exercises, remember:   Muscles can gain both the  endurance and the strength needed for everyday activities through controlled exercises.  Complete these exercises as instructed by your physician, physical therapist or athletic trainer. Progress the resistance and repetitions only as guided. STRENGTH - Towel Curls  Sit in a chair positioned on a non-carpeted surface.  Place your foot on a towel, keeping your heel on the floor.  Pull the towel toward your heel by only curling your toes. Keep your heel on the floor.  If instructed by your physician, physical therapist or athletic trainer, add ____________________ at the end of the towel. Repeat __________ times. Complete this exercise __________ times per day. STRENGTH - Ankle Inversion  Secure one end of a rubber exercise band/tubing to a fixed object (table, pole). Loop the other end around your foot just before your toes.  Place your fists between your knees. This will focus your strengthening at your ankle.  Slowly, pull your big toe up and in, making sure the band/tubing is positioned to resist the entire motion.  Hold this position for __________ seconds.  Have your muscles resist the band/tubing as it slowly pulls your foot back to the starting position. Repeat __________ times. Complete this exercises __________ times per day.  Document Released: 02/18/2005 Document Revised: 05/13/2011 Document Reviewed: 06/02/2008 [facility] Patient Information 2015 [employer] [place]. This information is not intended to replace advice given to you by your health care provider. Make sure you discuss any questions you have with your health care provider.

## 2014-07-05 ENCOUNTER — Encounter: Payer: BLUE CROSS/BLUE SHIELD | Admitting: Podiatry

## 2014-07-05 ENCOUNTER — Ambulatory Visit: Payer: Self-pay

## 2014-07-05 NOTE — Progress Notes (Signed)
This encounter was created in error - please disregard.

## 2014-08-16 ENCOUNTER — Emergency Department (HOSPITAL_COMMUNITY)
Admission: EM | Admit: 2014-08-16 | Discharge: 2014-08-16 | Disposition: A | Discharge status: Home / Self Care | Payer: BLUE CROSS/BLUE SHIELD | Attending: Emergency Medicine | Admitting: Emergency Medicine

## 2014-08-16 ENCOUNTER — Emergency Department (HOSPITAL_COMMUNITY): Payer: BLUE CROSS/BLUE SHIELD

## 2014-08-16 ENCOUNTER — Encounter (HOSPITAL_COMMUNITY): Payer: Self-pay | Admitting: Emergency Medicine

## 2014-08-16 DIAGNOSIS — Z87442 Personal history of urinary calculi: Secondary | ICD-10-CM | POA: Insufficient documentation

## 2014-08-16 DIAGNOSIS — Z8659 Personal history of other mental and behavioral disorders: Secondary | ICD-10-CM | POA: Insufficient documentation

## 2014-08-16 DIAGNOSIS — M542 Cervicalgia: Secondary | ICD-10-CM | POA: Insufficient documentation

## 2014-08-16 DIAGNOSIS — R0602 Shortness of breath: Secondary | ICD-10-CM | POA: Insufficient documentation

## 2014-08-16 DIAGNOSIS — R197 Diarrhea, unspecified: Secondary | ICD-10-CM | POA: Insufficient documentation

## 2014-08-16 DIAGNOSIS — Z791 Long term (current) use of non-steroidal anti-inflammatories (NSAID): Secondary | ICD-10-CM | POA: Diagnosis not present

## 2014-08-16 DIAGNOSIS — R079 Chest pain, unspecified: Secondary | ICD-10-CM | POA: Diagnosis present

## 2014-08-16 DIAGNOSIS — Z8669 Personal history of other diseases of the nervous system and sense organs: Secondary | ICD-10-CM | POA: Insufficient documentation

## 2014-08-16 DIAGNOSIS — R110 Nausea: Secondary | ICD-10-CM | POA: Diagnosis not present

## 2014-08-16 DIAGNOSIS — R630 Anorexia: Secondary | ICD-10-CM | POA: Insufficient documentation

## 2014-08-16 DIAGNOSIS — Z79899 Other long term (current) drug therapy: Secondary | ICD-10-CM | POA: Insufficient documentation

## 2014-08-16 DIAGNOSIS — J45909 Unspecified asthma, uncomplicated: Secondary | ICD-10-CM | POA: Diagnosis not present

## 2014-08-16 LAB — CBC
HCT: 36.9 % (ref 36.0–46.0)
Hemoglobin: 11.8 g/dL — ABNORMAL LOW (ref 12.0–15.0)
MCH: 26.1 pg (ref 26.0–34.0)
MCHC: 32 g/dL (ref 30.0–36.0)
MCV: 81.6 fL (ref 78.0–100.0)
PLATELETS: 272 10*3/uL (ref 150–400)
RBC: 4.52 MIL/uL (ref 3.87–5.11)
RDW: 14.6 % (ref 11.5–15.5)
WBC: 6.7 10*3/uL (ref 4.0–10.5)

## 2014-08-16 LAB — BASIC METABOLIC PANEL
ANION GAP: 7 (ref 5–15)
BUN: 9 mg/dL (ref 6–20)
CALCIUM: 9.4 mg/dL (ref 8.9–10.3)
CO2: 24 mmol/L (ref 22–32)
Chloride: 108 mmol/L (ref 101–111)
Creatinine, Ser: 0.83 mg/dL (ref 0.44–1.00)
GFR calc Af Amer: >60 mL/min (ref >60)
Glucose, Bld: 88 mg/dL (ref 65–99)
POTASSIUM: 3.7 mmol/L (ref 3.5–5.1)
SODIUM: 139 mmol/L (ref 135–145)

## 2014-08-16 LAB — I-STAT TROPONIN, ED
TROPONIN I, POC: 0 ng/mL (ref 0.00–0.08)
TROPONIN I, POC: 0.01 ng/mL (ref 0.00–0.08)

## 2014-08-16 MED ORDER — NITROGLYCERIN 0.4 MG SL SUBL: 0.4000 mg | SUBLINGUAL_TABLET | SUBLINGUAL | Status: DC | PRN

## 2014-08-16 MED ORDER — ASPIRIN 325 MG PO TABS
325.0000 mg | ORAL_TABLET | Freq: Once | ORAL | Status: AC
  Administered 2014-08-16: 325 mg via ORAL
  Filled 2014-08-16: qty 1

## 2014-08-16 NOTE — ED Notes (Signed)
Patient here with central chest pain. Started tonight while leaning forward reaching for object. Also states posterior left shoulder and neck pain. Denies history of similar.

## 2014-08-16 NOTE — Discharge Instructions (Signed)
Chest Pain (Nonspecific) Morgan [name],  Your workup for your heart today is negative. See a primary care physician within 3 days for close follow-up. If any symptoms worsen come back to the emergency department immediately. Thank you. It is often hard to give a diagnosis for the cause of chest pain. There is always a chance that your pain could be related to something serious, such as a heart attack or a blood clot in the lungs. You need to follow up with your doctor. HOME CARE  If antibiotic medicine was given, take it as directed by your doctor. Finish the medicine even if you start to feel better.  For the next few days, avoid activities that bring on chest pain. Continue physical activities as told by your doctor.  Do not use any tobacco products. This includes cigarettes, chewing tobacco, and e-cigarettes.  Avoid drinking alcohol.  Only take medicine as told by your doctor.  Follow your doctor's suggestions for more testing if your chest pain does not go away.  Keep all doctor visits you made. GET HELP IF:  Your chest pain does not go away, even after treatment.  You have a rash with blisters on your chest.  You have a fever. GET HELP RIGHT AWAY IF:   You have more pain or pain that spreads to your arm, neck, jaw, back, or belly (abdomen).  You have shortness of breath.  You cough more than usual or cough up blood.  You have very bad back or belly pain.  You feel sick to your stomach (nauseous) or throw up (vomit).  You have very bad weakness.  You pass out (faint).  You have chills. This is an emergency. Do not wait to see if the problems will go away. Call your local emergency services (911 in [place] Do not drive yourself to the hospital. MAKE SURE YOU:   Understand these instructions.  Will watch your condition.  Will get help right away if you are not doing well or get worse. Document Released: 08/07/2007 Document Revised: 02/23/2013 Document Reviewed:  08/07/2007 [facility] Patient Information 2015 [place], [place]. This information is not intended to replace advice given to you by your health care provider. Make sure you discuss any questions you have with your health care provider.

## 2014-08-16 NOTE — ED Provider Notes (Signed)
CSN: [id number]     Arrival date & time 08/16/14  0004 History  This chart was scribed for Tomasita Crumble, MD by Octavia Heir, ED Scribe. This patient was seen in room A11C/A11C and the patient's care was started at 2:09 AM.    Chief Complaint  Patient presents with  . Chest Pain      The history is provided by the patient. No language interpreter was used.    HPI Comments: Morgan [name] is a 39 y.o. female who presents to the Emergency Department complaining of gradual worsening chest pain onset this afternoon. Pt has associated difficulty breathing, nausea, and neck pain. Pt was at work when she was reaching for an object when the chest pain occurred. She notes she had to "clinch" her chest. Pt has loss of appetite and has had diarrhea for the past couple of days. Pt denies vomiting, diaphoresis, swelling in legs and fever. Pt further denies hypertension, DM, and blood clots. She also denies having similar episodes.  Past Medical History  Diagnosis Date  . Allergy   . Sleep apnea   . Anxiety   . Depression   . Asthma   . Abortion 03/09/2013  . Pregnancy induced hypertension   . Kidney stone    Past Surgical History  Procedure Laterality Date  . Mouth surgery  1999  . Cervical cerclage      all 3 pregnancies  . Dilation and curettage of uterus  2001    retained placenta  . Dilation and curettage of uterus  2006    SAB  . Tubal ligation     Family History  Problem Relation Age of Onset  . HIV Mother   . Seizures Mother     secondary to drug abuse  . Drug abuse Mother   . Heart disease Maternal Grandfather   . Diabetes Paternal Grandmother   . Cancer Paternal Grandfather    History  Substance Use Topics  . Smoking status: Never Smoker   . Smokeless tobacco: Never Used  . Alcohol Use: No     Comment: less than once a week-holidays, birthday   OB History    Gravida Para Term Preterm AB TAB SAB Ectopic Multiple Living   7 3 1 2 4 2 2  0 0 1     Review of  Systems  A complete 10 system review of systems was obtained and all systems are negative except as noted in the HPI and PMH.    Allergies  Stadol and Kale  Home Medications   Prior to Admission medications   Medication Sig Start Date End Date Taking? Authorizing Provider  ibuprofen (ADVIL,MOTRIN) 800 MG tablet Take 1 tablet (800 mg total) by mouth 3 (three) times daily. 06/08/14   Ladona Mow, PA-C  IRON PO Take 1 tablet by mouth daily.     Historical Provider, MD  meloxicam (MOBIC) 15 MG tablet Take 1 tablet (15 mg total) by mouth daily. 06/16/14   Ethelda Chick, MD  traZODone (DESYREL) 50 MG tablet Take 25 mg by mouth at bedtime as needed for sleep.    Historical Provider, MD   Triage vitals: BP 120/78 mmHg  Pulse 68  Temp(Src) 98.2 F (36.8 C) (Oral)  Resp 16  SpO2 97%  LMP 08/02/2014 (Approximate) Physical Exam  Constitutional: She is oriented to person, place, and time. She appears well-developed and well-nourished. No distress.  HENT:  Head: Normocephalic and atraumatic.  Nose: Nose normal.  Mouth/Throat: Oropharynx is clear and moist.  No oropharyngeal exudate.  Eyes: Conjunctivae and EOM are normal. Pupils are equal, round, and reactive to light. No scleral icterus.  Neck: Normal range of motion. Neck supple. No JVD present. No tracheal deviation present. No thyromegaly present.  Cardiovascular: Normal rate, regular rhythm and normal heart sounds.  Exam reveals no gallop and no friction rub.   No murmur heard. Pulmonary/Chest: Effort normal and breath sounds normal. No respiratory distress. She has no wheezes. She exhibits no tenderness.  Abdominal: Soft. Bowel sounds are normal. She exhibits no distension and no mass. There is no tenderness. There is no rebound and no guarding.  Musculoskeletal: Normal range of motion. She exhibits no edema or tenderness.  Lymphadenopathy:    She has no cervical adenopathy.  Neurological: She is alert and oriented to person, place, and  time. No cranial nerve deficit. She exhibits normal muscle tone.  Skin: Skin is warm and dry. No rash noted. No erythema. No pallor.  Nursing note and vitals reviewed.   ED Course  Procedures  DIAGNOSTIC STUDIES: Oxygen Saturation is 97% on RA, normal by my interpretation.  COORDINATION OF CARE:  2:12 AM Discussed treatment plan which includes aspirin, lab work, EKG, chest x-ray with pt at bedside and pt agreed to plan.   Labs Review Labs Reviewed  CBC - Abnormal; Notable for the following:    Hemoglobin 11.8 (*)    All other components within normal limits  BASIC METABOLIC PANEL  I-STAT TROPOININ, ED  Rosezena Sensor, ED    Imaging Review Dg Chest 2 View  08/16/2014   CLINICAL DATA:  Chest pain and shortness of breath tonight. History of asthma.  EXAM: CHEST  2 VIEW  COMPARISON:  None.  FINDINGS: The heart size and mediastinal contours are within normal limits. Both lungs are clear. The visualized skeletal structures are unremarkable.  IMPRESSION: No active cardiopulmonary disease.   Electronically Signed   By: Burman Nieves M.D.   On: 08/16/2014 04:36     EKG Interpretation   Date/Time:  Tuesday August 16 2014 00:10:55 EDT Ventricular Rate:  72 PR Interval:  154 QRS Duration: 90 QT Interval:  374 QTC Calculation: 409 R Axis:   42 Text Interpretation:  Normal sinus rhythm with sinus arrhythmia Possible  Anterior infarct , age undetermined Abnormal ECG Confirmed by Erroll Luna [id number]) on 08/16/2014 1:56:34 AM      MDM   Final diagnoses:  None   Patient presents emergency department for chest pain. She is low risk for ACS. Will evaluate in the emergency department with a heart score. Initial troponin is negative, chest x-ray negative, EKG does not show signs of ischemia. Patient was observed in the emergency department for 4 hours.   Repeat troponin is negative. Heart score continues to be 0.  An EKG was obtained during an episode of acute chest pain and  did not show ischemia.At this time she is comfortable in no acute distress. Her vital signs were within her normal limits and she is safe for discharge.    I personally performed the services described in this documentation, which was scribed in my presence. The recorded information has been reviewed and is accurate.   Tomasita Crumble, MD 08/16/14 [phone]

## 2014-08-16 NOTE — ED Notes (Signed)
Patient transported to X-ray 

## 2014-08-25 ENCOUNTER — Encounter (HOSPITAL_COMMUNITY): Payer: Self-pay | Admitting: *Deleted

## 2014-08-25 ENCOUNTER — Emergency Department (HOSPITAL_COMMUNITY)
Admission: EM | Admit: 2014-08-25 | Discharge: 2014-08-25 | Disposition: A | Discharge status: Home / Self Care | Payer: BLUE CROSS/BLUE SHIELD | Attending: Emergency Medicine | Admitting: Emergency Medicine

## 2014-08-25 DIAGNOSIS — F419 Anxiety disorder, unspecified: Secondary | ICD-10-CM | POA: Insufficient documentation

## 2014-08-25 DIAGNOSIS — J45909 Unspecified asthma, uncomplicated: Secondary | ICD-10-CM | POA: Diagnosis not present

## 2014-08-25 DIAGNOSIS — R112 Nausea with vomiting, unspecified: Secondary | ICD-10-CM | POA: Diagnosis present

## 2014-08-25 DIAGNOSIS — Z87442 Personal history of urinary calculi: Secondary | ICD-10-CM | POA: Diagnosis not present

## 2014-08-25 DIAGNOSIS — G43109 Migraine with aura, not intractable, without status migrainosus: Secondary | ICD-10-CM | POA: Diagnosis not present

## 2014-08-25 DIAGNOSIS — F329 Major depressive disorder, single episode, unspecified: Secondary | ICD-10-CM | POA: Insufficient documentation

## 2014-08-25 MED ORDER — ONDANSETRON 4 MG PO TBDP
8.0000 mg | ORAL_TABLET | Freq: Once | ORAL | Status: AC
  Administered 2014-08-25: 8 mg via ORAL

## 2014-08-25 MED ORDER — DEXAMETHASONE SODIUM PHOSPHATE 10 MG/ML IJ SOLN
10.0000 mg | Freq: Once | INTRAMUSCULAR | Status: AC
  Administered 2014-08-25: 10 mg via INTRAVENOUS
  Filled 2014-08-25: qty 1

## 2014-08-25 MED ORDER — KETOROLAC TROMETHAMINE 30 MG/ML IJ SOLN
30.0000 mg | Freq: Once | INTRAMUSCULAR | Status: AC
  Administered 2014-08-25: 30 mg via INTRAVENOUS
  Filled 2014-08-25: qty 1

## 2014-08-25 MED ORDER — METOCLOPRAMIDE HCL 5 MG/ML IJ SOLN
10.0000 mg | Freq: Once | INTRAMUSCULAR | Status: AC
  Administered 2014-08-25: 10 mg via INTRAVENOUS
  Filled 2014-08-25: qty 2

## 2014-08-25 MED ORDER — ONDANSETRON 4 MG PO TBDP
ORAL_TABLET | ORAL | Status: AC
  Filled 2014-08-25: qty 2

## 2014-08-25 MED ORDER — DIPHENHYDRAMINE HCL 50 MG/ML IJ SOLN
25.0000 mg | Freq: Once | INTRAMUSCULAR | Status: AC
  Administered 2014-08-25: 25 mg via INTRAVENOUS
  Filled 2014-08-25: qty 1

## 2014-08-25 NOTE — Discharge Instructions (Signed)

## 2014-08-25 NOTE — ED Provider Notes (Signed)
CSN: [id number]     Arrival date & time 08/25/14  5329 History   First MD Initiated Contact with Patient 08/25/14 [phone]     Chief Complaint  Patient presents with  . Migraine  . Nausea  . Emesis     (Consider location/radiation/quality/duration/timing/severity/associated sxs/prior Treatment) HPI 39 year old female presents to the emergency department from home with complaint of headache.  Headache is bitemporal and posterior, it is associated with an aura, nausea.  Patient reports headache is similar to prior migraines.  She reports it's been several months since her last migraine.  She is not currently being followed by neurologist.  She reports she has never seen a doctor about her migraines as a and coming to the emergency department.  She has taken ibuprofen without improvement. Past Medical History  Diagnosis Date  . Allergy   . Sleep apnea   . Anxiety   . Depression   . Asthma   . Abortion 03/09/2013  . Pregnancy induced hypertension   . Kidney stone    Past Surgical History  Procedure Laterality Date  . Mouth surgery  1999  . Cervical cerclage      all 3 pregnancies  . Dilation and curettage of uterus  2001    retained placenta  . Dilation and curettage of uterus  2006    SAB  . Tubal ligation     Family History  Problem Relation Age of Onset  . HIV Mother   . Seizures Mother     secondary to drug abuse  . Drug abuse Mother   . Heart disease Maternal Grandfather   . Diabetes Paternal Grandmother   . Cancer Paternal Grandfather    History  Substance Use Topics  . Smoking status: Never Smoker   . Smokeless tobacco: Never Used  . Alcohol Use: No     Comment: less than once a week-holidays, birthday   OB History    Gravida Para Term Preterm AB TAB SAB Ectopic Multiple Living   7 3 1 2 4 2 2  0 0 1     Review of Systems   See History of Present Illness; otherwise all other systems are reviewed and negative  Allergies  Stadol and Kale  Home Medications    Prior to Admission medications   Medication Sig Start Date End Date Taking? Authorizing Provider  ibuprofen (ADVIL,MOTRIN) 800 MG tablet Take 1 tablet (800 mg total) by mouth 3 (three) times daily. Patient taking differently: Take 800 mg by mouth 3 (three) times daily as needed for moderate pain.  06/08/14  Yes Ladona Mow, PA-C  IRON PO Take 1 tablet by mouth 2 (two) times a week.    Yes Historical Provider, MD  traZODone (DESYREL) 50 MG tablet Take 25 mg by mouth at bedtime as needed for sleep.   Yes Historical Provider, MD   BP 129/80 mmHg  Pulse 44  Temp(Src) 98.4 F (36.9 C) (Oral)  Resp 16  SpO2 100%  LMP 08/02/2014 (Approximate) Physical Exam  Constitutional: She is oriented to person, place, and time. She appears well-developed and well-nourished.  HENT:  Head: Normocephalic and atraumatic.  Right Ear: External ear normal.  Left Ear: External ear normal.  Nose: Nose normal.  Mouth/Throat: Oropharynx is clear and moist.  Eyes: Conjunctivae and EOM are normal. Pupils are equal, round, and reactive to light.  Neck: Normal range of motion. Neck supple. No JVD present. No tracheal deviation present. No thyromegaly present.  Cardiovascular: Normal rate, regular rhythm, normal heart  sounds and intact distal pulses.  Exam reveals no gallop and no friction rub.   No murmur heard. Pulmonary/Chest: Effort normal and breath sounds normal. No stridor. No respiratory distress. She has no wheezes. She has no rales. She exhibits no tenderness.  Abdominal: Soft. Bowel sounds are normal. She exhibits no distension and no mass. There is no tenderness. There is no rebound and no guarding.  Musculoskeletal: Normal range of motion. She exhibits no edema or tenderness.  Lymphadenopathy:    She has no cervical adenopathy.  Neurological: She is alert and oriented to person, place, and time. She displays normal reflexes. No cranial nerve deficit. She exhibits normal muscle tone. Coordination normal.   Skin: Skin is warm and dry. No rash noted. No erythema. No pallor.  Psychiatric: She has a normal mood and affect. Her behavior is normal. Judgment and thought content normal.  Nursing note and vitals reviewed.   ED Course  Procedures (including critical care time) Labs Review Labs Reviewed - No data to display  Imaging Review No results found.   EKG Interpretation None      MDM   Final diagnoses:  Migraine with aura and without status migrainosus, not intractable   39 year old female with her typical migraine.  Neuro exam is normal.  Plan for migraine cocktail.  6:03 AM Patient reports that she feels much better after the migraine cocktail.  Plan for discharge home  Marisa Severin, MD 08/25/14 [phone]

## 2014-08-25 NOTE — ED Notes (Signed)
Pt reports migraine that started over the weekend. Pt reports nausea, photophobia, blurry vision and seeing spot associated with the migraine. Pt reports history of the same.

## 2014-09-01 ENCOUNTER — Ambulatory Visit (INDEPENDENT_AMBULATORY_CARE_PROVIDER_SITE_OTHER): Payer: BLUE CROSS/BLUE SHIELD | Admitting: Emergency Medicine

## 2014-09-01 VITALS — BP 118/88 | HR 64 | Temp 98.4°F | Resp 16 | Ht 65.0 in | Wt 181.4 lb

## 2014-09-01 DIAGNOSIS — N3000 Acute cystitis without hematuria: Secondary | ICD-10-CM | POA: Diagnosis not present

## 2014-09-01 DIAGNOSIS — N760 Acute vaginitis: Secondary | ICD-10-CM | POA: Diagnosis not present

## 2014-09-01 DIAGNOSIS — L98492 Non-pressure chronic ulcer of skin of other sites with fat layer exposed: Secondary | ICD-10-CM

## 2014-09-01 LAB — POCT URINALYSIS DIPSTICK
BILIRUBIN UA: NEGATIVE
Blood, UA: NEGATIVE
Glucose, UA: NEGATIVE
KETONES UA: NEGATIVE
NITRITE UA: NEGATIVE
PH UA: 6.5
Spec Grav, UA: 1.025
Urobilinogen, UA: 1

## 2014-09-01 LAB — POCT UA - MICROSCOPIC ONLY
Casts, Ur, LPF, POC: NEGATIVE
Crystals, Ur, HPF, POC: NEGATIVE
Mucus, UA: POSITIVE
Yeast, UA: NEGATIVE

## 2014-09-01 LAB — POCT WET PREP WITH KOH
Clue Cells Wet Prep HPF POC: NEGATIVE
KOH Prep POC: NEGATIVE
RBC Wet Prep HPF POC: NEGATIVE
TRICHOMONAS UA: NEGATIVE
YEAST WET PREP PER HPF POC: NEGATIVE

## 2014-09-01 MED ORDER — SULFAMETHOXAZOLE-TRIMETHOPRIM 800-160 MG PO TABS: 1.0000 | ORAL_TABLET | Freq: Two times a day (BID) | ORAL | Status: DC

## 2014-09-01 NOTE — Progress Notes (Signed)
Subjective:  Patient ID: Morgan [name], female    DOB: 01/11/1976  Age: 39 y.o. MRN: [id number]  CC: wounds and Vaginal Itching   HPI Morgan [name] presents  with dysuria. She has urge incontinence. Is concerned that she might have a vaginal infection. Has no discharge has no increasing smell. Has no fever or chills. Has no nausea vomiting. Has no stool change.  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 She's had no improvement with over-the-counter medication.  History Gabriel RungMonique has a past medical history of Allergy; Sleep apnea; Anxiety; Depression; Asthma; Abortion (03/09/2013); Pregnancy induced hypertension; and Kidney stone.   She has past surgical history that includes Mouth surgery (1999); Cervical cerclage; Dilation and curettage of uterus (2001); Dilation and curettage of uterus (2006); and Tubal ligation.   Her  family history includes Cancer in her paternal grandfather; Diabetes in her paternal grandmother; Drug abuse in her mother; HIV in her mother; Heart disease in her maternal grandfather; Seizures in her mother.  She   reports that she has never smoked. She has never used smokeless tobacco. She reports that she uses illicit drugs (Marijuana) about once per week. She reports that she does not drink alcohol.  Outpatient Prescriptions Prior to Visit  Medication Sig Dispense Refill  . ibuprofen (ADVIL,MOTRIN) 800 MG tablet Take 1 tablet (800 mg total) by mouth 3 (three) times daily. (Patient not taking: Reported on 09/01/2014) 21 tablet 0  . IRON PO Take 1 tablet by mouth 2 (two) times a week.     . traZODone (DESYREL) 50 MG tablet Take 25 mg by mouth at bedtime as needed for sleep.     No facility-administered medications prior to visit.    History   Social History  . Marital Status: Married    Spouse Name: Milus BanisterRyan Cole  . Number of Children: 1  .  Years of Education: 12   Occupational History  . Academic librarianpackage handler [employer]   Social History Main Topics  . Smoking status: Never Smoker   . Smokeless tobacco: Never Used  . Alcohol Use: No     Comment: less than once a week-holidays, birthday  . Drug Use: 1.00 per week    Special: Marijuana  . Sexual Activity:    Partners: Male    Pharmacist, hospitalBirth Control/ Protection: None   Other Topics Concern  . None   Social History Narrative   Lives with her husband and their daughter (born 2007).     Review of Systems  Constitutional: Negative for fever, chills and appetite change.  HENT: Negative for congestion, ear pain, postnasal drip, sinus pressure and sore throat.   Eyes: Negative for pain and redness.  Respiratory: Negative for cough, shortness of breath and wheezing.   Cardiovascular: Negative for leg swelling.  Gastrointestinal: Negative for nausea, vomiting, abdominal pain, diarrhea, constipation and blood in stool.  Endocrine: Negative for polyuria.  Genitourinary: Positive for dysuria and urgency. Negative for frequency and flank pain.  Musculoskeletal: Negative for gait problem.  Skin: Negative for rash.  Neurological: Negative for weakness and headaches.  Psychiatric/Behavioral: Negative for confusion and decreased concentration. The patient is not nervous/anxious.     Objective:  BP 118/88 mmHg  Pulse 64  Temp(Src) 98.4 F (36.9 C) (Oral)  Resp 16  Ht 5\' 5"  (1.651 m)  Wt 181 lb 6.4 oz (82.283 kg)  BMI 30.19  kg/m2  SpO2 98%  LMP 08/21/2014  Physical Exam  Constitutional: She is oriented to person, place, and time. She appears well-developed and well-nourished. No distress.  HENT:  Head: Normocephalic and atraumatic.  Right Ear: External ear normal.  Left Ear: External ear normal.  Nose: Nose normal.  Eyes: Conjunctivae and EOM are normal. Pupils are equal, round, and reactive to light. No scleral icterus.  Neck: Normal range of motion. Neck supple. No tracheal deviation  present.  Cardiovascular: Normal rate, regular rhythm and normal heart sounds.   Pulmonary/Chest: Effort normal. No respiratory distress. She has no wheezes. She has no rales.  Abdominal: She exhibits no mass. There is no tenderness. There is no rebound and no guarding.  Musculoskeletal: She exhibits no edema.  Lymphadenopathy:    She has no cervical adenopathy.  Neurological: She is alert and oriented to person, place, and time. Coordination normal.  Skin: Skin is warm and dry. No rash noted.  Psychiatric: She has a normal mood and affect. Her behavior is normal.      Assessment & Plan:   Mellina was seen today for wounds and vaginal itching.  Diagnoses and all orders for this visit:  Acute cystitis without hematuria  Ulcer of skin, with fat layer exposed Orders: -     POCT Wet Prep with KOH -     POCT UA - Microscopic Only -     POCT urinalysis dipstick -     RPR  Vaginitis and vulvovaginitis Orders: -     POCT Wet Prep with KOH -     POCT UA - Microscopic Only -     POCT urinalysis dipstick  Other orders -     sulfamethoxazole-trimethoprim (BACTRIM DS,SEPTRA DS) 800-160 MG per tablet; Take 1 tablet by mouth 2 (two) times daily.   I am having Ms. [name] start on sulfamethoxazole-trimethoprim. I am also having her maintain her traZODone, IRON PO, and ibuprofen.  Meds ordered this encounter  Medications  . sulfamethoxazole-trimethoprim (BACTRIM DS,SEPTRA DS) 800-160 MG per tablet    Sig: Take 1 tablet by mouth 2 (two) times daily.    Dispense:  20 tablet    Refill:  0    Appropriate red flag conditions were discussed with the patient as well as actions that should be taken.  Patient expressed his understanding.  Follow-up: No Follow-up on file.  Carmelina Dane, MD   Results for orders placed or performed in visit on 09/01/14  POCT Wet Prep with KOH  Result Value Ref Range   Trichomonas, UA Negative    Clue Cells Wet Prep HPF POC neg    Epithelial  Wet Prep HPF POC Many Few, Moderate, Many   Yeast Wet Prep HPF POC neg    Bacteria Wet Prep HPF POC Few Few   RBC Wet Prep HPF POC neg    WBC Wet Prep HPF POC 0-1    KOH Prep POC Negative   POCT UA - Microscopic Only  Result Value Ref Range   WBC, Ur, HPF, POC 2-10    RBC, urine, microscopic 1-4    Bacteria, U Microscopic trace    Mucus, UA positive    Epithelial cells, urine per micros 1-4    Crystals, Ur, HPF, POC neg    Casts, Ur, LPF, POC neg    Yeast, UA neg    Renal tubular cells    POCT urinalysis dipstick  Result Value Ref Range   Color, UA yellow    Clarity,  UA clear    Glucose, UA neg    Bilirubin, UA neg    Ketones, UA neg    Spec Grav, UA 1.025    Blood, UA neg    pH, UA 6.5    Protein, UA trace    Urobilinogen, UA 1.0    Nitrite, UA neg    Leukocytes, UA Trace (A) Negative

## 2014-09-01 NOTE — Patient Instructions (Signed)

## 2014-09-02 ENCOUNTER — Encounter: Payer: Self-pay | Admitting: Family Medicine

## 2014-09-02 LAB — RPR

## 2014-09-08 ENCOUNTER — Ambulatory Visit (INDEPENDENT_AMBULATORY_CARE_PROVIDER_SITE_OTHER): Payer: BLUE CROSS/BLUE SHIELD | Admitting: Physician Assistant

## 2014-09-08 VITALS — BP 116/70 | HR 73 | Temp 98.4°F | Resp 18 | Ht 65.0 in | Wt 179.0 lb

## 2014-09-08 DIAGNOSIS — G43001 Migraine without aura, not intractable, with status migrainosus: Secondary | ICD-10-CM

## 2014-09-08 DIAGNOSIS — Z8659 Personal history of other mental and behavioral disorders: Secondary | ICD-10-CM

## 2014-09-08 MED ORDER — KETOROLAC TROMETHAMINE 60 MG/2ML IM SOLN
60.0000 mg | Freq: Once | INTRAMUSCULAR | Status: AC
  Administered 2014-09-08: 60 mg via INTRAMUSCULAR

## 2014-09-08 NOTE — Progress Notes (Signed)
   Subjective:    Patient ID: Morgan StanfordMonique Price-Cole, female    DOB: 12/04/1975, 39 y.o.   MRN: [id number]  Chief Complaint  Patient presents with  . Headache    since yesterday  . Depression    see screen   Patient Active Problem List   Diagnosis Date Noted  . Ureterolithiasis 04/19/2014  . Migraine 03/18/2013  . OSA on CPAP 03/18/2013   Medications, allergies, past medical history, surgical history, family history, social history and problem list reviewed and updated.  HPI  [age] yof with hx migraines presents with HA.   Started gradually yest afternoon. Left temporal area no radiation. Photophobia. No aura which is somewhat normal for her migraines. No relief with aleve. Has been constant since onset, 7/10. Not worst of life. Denies fevers, chills. Feels like her typical migraines.   Positive depression screening. 13 on phq9. Pt admits to being diagnosed with depression several yrs ago. Was seeing a psychiatrist and therapist for couple yrs. Was on celexa. Unfortunately she didn't have funds to pay for ongoing treatment. No longer on antidepressants. She states she is doing well. Feels her depression is well controlled and not interested in re starting meds at this time. She is planning to restart therapy once she has the funds.   Review of Systems See HPI. Mild nausea. No vomiting.     Objective:   Physical Exam  Constitutional: She is oriented to person, place, and time.  BP 116/70 mmHg  Pulse 73  Temp(Src) 98.4 F (36.9 C) (Oral)  Resp 18  Ht 5\' 5"  (1.651 m)  Wt 179 lb (81.194 kg)  BMI 29.79 kg/m2  SpO2 98%  LMP 08/21/2014   Eyes: Conjunctivae and EOM are normal. Pupils are equal, round, and reactive to light.  Photophobia  Neck: No Brudzinski's sign noted.  Neurological: She is alert and oriented to person, place, and time. No cranial nerve deficit.      Assessment & Plan:   Migraine without aura and with status migrainosus, not intractable - Plan: ketorolac  (TORADOL) injection 60 mg --HA is typical to her migraines --doubt sah as not worst of life, not sudden onset, doubt meningitis with no fevers, no nuchal rigidity --toradol 60 IM resolved pain  --pt has zofran at home --pt plans to f/u with pcp Dr Nilda SimmerKristi Smith to discuss abortive/preventative medications  History of depression --13 on phq9 --pt feels she is doing well, denies si/hi, able to function at work, at home --declines restarting celexa, she will f/u with therapist when she is able to  Donnajean Lopesodd M. Tamana Hatfield, PA-C Physician Assistant-Certified [facility]  09/08/2014 2:24 PM

## 2014-10-12 ENCOUNTER — Ambulatory Visit (INDEPENDENT_AMBULATORY_CARE_PROVIDER_SITE_OTHER): Payer: BLUE CROSS/BLUE SHIELD | Admitting: Family Medicine

## 2014-10-12 VITALS — BP 118/80 | HR 95 | Temp 98.7°F | Resp 16 | Ht 65.0 in | Wt 174.2 lb

## 2014-10-12 DIAGNOSIS — J020 Streptococcal pharyngitis: Secondary | ICD-10-CM

## 2014-10-12 DIAGNOSIS — J029 Acute pharyngitis, unspecified: Secondary | ICD-10-CM | POA: Diagnosis not present

## 2014-10-12 LAB — POCT RAPID STREP A (OFFICE): Rapid Strep A Screen: POSITIVE — AB

## 2014-10-12 MED ORDER — PENICILLIN G BENZATHINE 1200000 UNIT/2ML IM SUSP
1.2000 10*6.[IU] | Freq: Once | INTRAMUSCULAR | Status: AC
  Administered 2014-10-13: 1.2 10*6.[IU] via INTRAMUSCULAR

## 2014-10-12 MED ORDER — MAGIC MOUTHWASH W/LIDOCAINE: 5.0000 mL | Freq: Four times a day (QID) | ORAL | Status: DC | PRN

## 2014-10-12 MED ORDER — AMOXICILLIN 875 MG PO TABS: 875.0000 mg | ORAL_TABLET | Freq: Two times a day (BID) | ORAL | Status: DC

## 2014-10-12 NOTE — Progress Notes (Signed)
Chief Complaint:  Chief Complaint  Patient presents with  . Sore Throat    HPI: Morgan [name] is a 39 y.o. female who reports to [facility] today complaining of 2 day history of sore throat, odynophagia and subject fevers and chills. Cannot eat anything.  No rashes, nausea, vomiting. + chills  And subjective fevers.   Past Medical History  Diagnosis Date  . Allergy   . Sleep apnea   . Anxiety   . Depression   . Asthma   . Abortion 03/09/2013  . Pregnancy induced hypertension   . Kidney stone    Past Surgical History  Procedure Laterality Date  . Mouth surgery  1999  . Cervical cerclage      all 3 pregnancies  . Dilation and curettage of uterus  2001    retained placenta  . Dilation and curettage of uterus  2006    SAB  . Tubal ligation     Social History   Social History  . Marital Status: Married    Spouse Name: [name]  . Number of Children: 1  . Years of Education: 12   Occupational History  . 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 Smoking status: Never Smoker   . Smokeless tobacco: Never Used  . Alcohol Use: No     Comment: less than once a week-holidays, birthday  . Drug Use: 1.00 per week    Special: Marijuana  . Sexual Activity:    Partners: Male    Pharmacist, hospital Protection: None   Other Topics Concern  . None   Social History Narrative   Lives with her husband and their daughter (born 2007).   Family History  Problem Relation Age of Onset  . HIV Mother   . Seizures Mother     secondary to drug abuse  . Drug abuse Mother   . Heart disease Maternal Grandfather   . Diabetes Paternal Grandmother   . Cancer Paternal Grandfather    Allergies  Allergen Reactions  . Stadol [Butorphanol Tartrate] Itching  . Kale Itching and Rash   Prior to Admission medications   Medication Sig Start Date End Date Taking? Authorizing Provider  IRON PO Take 1 tablet by mouth 2 (two) times a week.    Yes Historical Provider, MD      ROS: The patient denies fevers, chills, night sweats, unintentional weight loss, chest pain, palpitations, wheezing, dyspnea on exertion, nausea, vomiting, abdominal pain, dysuria, hematuria, melena, numbness, weakness, or tingling.   All other systems have been reviewed and were otherwise negative with the exception of those mentioned in the HPI and as above.    PHYSICAL EXAM: Filed Vitals:   10/12/14 1945  BP: 118/80  Pulse: 95  Temp: 98.7 F (37.1 C)  Resp: 16   Body mass index is 28.99 kg/(m^2).   General: Alert, no acute distress HEENT:  Normocephalic, atraumatic, oropharynx patent. EOMI, PERRLA + erythematous tonsils, + exudates bilateral Cardiovascular:  Regular rate and rhythm, no rubs murmurs or gallops.   Respiratory: Clear to auscultation bilaterally.  No wheezes, rales, or rhonchi.  No cyanosis, no use of accessory musculature Abdominal: No organomegaly, abdomen is soft and non-tender, positive bowel sounds. No masses. Skin: No rashes. Neurologic: Facial musculature symmetric. Psychiatric: Patient acts appropriately throughout our interaction. Lymphatic: No cervical or submandibular lymphadenopathy Musculoskeletal: Gait intact. No edema, tenderness   LABS: Results for orders placed or performed in visit on 10/12/14  POCT rapid  strep A  Result Value Ref Range   Rapid Strep A Screen Positive (A) Negative     EKG/XRAY:   Primary read interpreted by Dr. Conley Rolls at [facility].   ASSESSMENT/PLAN: Encounter Diagnoses  Name Primary?  . Acute pharyngitis, unspecified pharyngitis type Yes  . Streptococcal sore throat    BIcillin IM x 1 Rx Amoxacillin  Magic mouth wash with lidocaine FU prn , work note given    Gross sideeffects, risk and benefits, and alternatives of medications d/w patient. Patient is aware that all medications have potential sideeffects and we are unable to predict every sideeffect or drug-drug interaction that may occur.  Jamere Stidham DO   10/12/2014 8:23 PM

## 2014-10-12 NOTE — Patient Instructions (Signed)

## 2014-11-16 ENCOUNTER — Encounter (HOSPITAL_COMMUNITY): Payer: Self-pay | Admitting: *Deleted

## 2014-11-16 ENCOUNTER — Emergency Department (HOSPITAL_COMMUNITY)
Admission: EM | Admit: 2014-11-16 | Discharge: 2014-11-17 | Discharge status: Against Medical Advice | Payer: BLUE CROSS/BLUE SHIELD | Attending: Emergency Medicine | Admitting: Emergency Medicine

## 2014-11-16 DIAGNOSIS — Y9289 Other specified places as the place of occurrence of the external cause: Secondary | ICD-10-CM | POA: Diagnosis not present

## 2014-11-16 DIAGNOSIS — R102 Pelvic and perineal pain: Secondary | ICD-10-CM | POA: Diagnosis not present

## 2014-11-16 DIAGNOSIS — J45909 Unspecified asthma, uncomplicated: Secondary | ICD-10-CM | POA: Diagnosis not present

## 2014-11-16 DIAGNOSIS — S0990XA Unspecified injury of head, initial encounter: Secondary | ICD-10-CM | POA: Insufficient documentation

## 2014-11-16 DIAGNOSIS — Z3202 Encounter for pregnancy test, result negative: Secondary | ICD-10-CM | POA: Diagnosis not present

## 2014-11-16 DIAGNOSIS — Y999 Unspecified external cause status: Secondary | ICD-10-CM | POA: Insufficient documentation

## 2014-11-16 DIAGNOSIS — R1032 Left lower quadrant pain: Secondary | ICD-10-CM | POA: Insufficient documentation

## 2014-11-16 DIAGNOSIS — Z87442 Personal history of urinary calculi: Secondary | ICD-10-CM | POA: Diagnosis not present

## 2014-11-16 DIAGNOSIS — R51 Headache: Secondary | ICD-10-CM | POA: Diagnosis present

## 2014-11-16 DIAGNOSIS — Y9389 Activity, other specified: Secondary | ICD-10-CM | POA: Insufficient documentation

## 2014-11-16 DIAGNOSIS — Z8659 Personal history of other mental and behavioral disorders: Secondary | ICD-10-CM | POA: Diagnosis not present

## 2014-11-16 DIAGNOSIS — G43001 Migraine without aura, not intractable, with status migrainosus: Secondary | ICD-10-CM | POA: Diagnosis not present

## 2014-11-16 MED ORDER — ONDANSETRON 4 MG PO TBDP
4.0000 mg | ORAL_TABLET | Freq: Once | ORAL | Status: AC
  Administered 2014-11-16: 4 mg via ORAL

## 2014-11-16 MED ORDER — ONDANSETRON 4 MG PO TBDP
ORAL_TABLET | ORAL | Status: AC
  Filled 2014-11-16: qty 1

## 2014-11-16 MED ORDER — OXYCODONE-ACETAMINOPHEN 5-325 MG PO TABS
ORAL_TABLET | ORAL | Status: AC
  Filled 2014-11-16: qty 1

## 2014-11-16 MED ORDER — OXYCODONE-ACETAMINOPHEN 5-325 MG PO TABS
1.0000 | ORAL_TABLET | Freq: Once | ORAL | Status: AC
  Administered 2014-11-16: 1 via ORAL
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
## 2014-11-17 ENCOUNTER — Telehealth (HOSPITAL_BASED_OUTPATIENT_CLINIC_OR_DEPARTMENT_OTHER): Payer: Self-pay | Admitting: Emergency Medicine

## 2014-11-17 LAB — WET PREP, GENITAL
TRICH WET PREP: NONE SEEN
YEAST WET PREP: NONE SEEN

## 2014-11-17 LAB — URINALYSIS, ROUTINE W REFLEX MICROSCOPIC
GLUCOSE, UA: NEGATIVE mg/dL
KETONES UR: 40 mg/dL — AB
Nitrite: POSITIVE — AB
PH: 5.5 (ref 5.0–8.0)
Protein, ur: 30 mg/dL — AB
Specific Gravity, Urine: 1.028 (ref 1.005–1.030)
Urobilinogen, UA: 1 mg/dL (ref 0.0–1.0)

## 2014-11-17 LAB — URINE MICROSCOPIC-ADD ON

## 2014-11-17 LAB — GC/CHLAMYDIA PROBE AMP (~~LOC~~) NOT AT ARMC
Chlamydia: NEGATIVE
Neisseria Gonorrhea: NEGATIVE

## 2014-11-17 LAB — POC URINE PREG, ED: PREG TEST UR: NEGATIVE

## 2014-11-17 NOTE — ED Notes (Signed)
Dr. Nanavanti is at the bedside.  

## 2014-11-17 NOTE — ED Notes (Signed)
Dr. Nanavanti at the bedside.  

## 2014-11-17 NOTE — ED Notes (Signed)
Dr. Shyrl Numbers at the bedside with pelvic cart. Discussed patient's concern about pain medication and she is driving self home. MD acknowledges, no new orders at this time.

## 2014-11-17 NOTE — ED Notes (Signed)
Pt able to ambulate independently to restroom to attempt for urine specimen.

## 2014-11-17 NOTE — ED Notes (Signed)
Patient ready to leave now, regardless of urine results. She states she already takes medicatiion for uti at home.

## 2014-11-18 ENCOUNTER — Ambulatory Visit (INDEPENDENT_AMBULATORY_CARE_PROVIDER_SITE_OTHER): Payer: BLUE CROSS/BLUE SHIELD | Admitting: Family Medicine

## 2014-11-18 VITALS — BP 116/70 | HR 72 | Temp 98.4°F | Resp 18 | Ht 66.0 in | Wt 171.0 lb

## 2014-11-18 DIAGNOSIS — B3731 Acute candidiasis of vulva and vagina: Secondary | ICD-10-CM

## 2014-11-18 DIAGNOSIS — G43101 Migraine with aura, not intractable, with status migrainosus: Secondary | ICD-10-CM | POA: Diagnosis not present

## 2014-11-18 DIAGNOSIS — F329 Major depressive disorder, single episode, unspecified: Secondary | ICD-10-CM

## 2014-11-18 DIAGNOSIS — B373 Candidiasis of vulva and vagina: Secondary | ICD-10-CM | POA: Diagnosis not present

## 2014-11-18 DIAGNOSIS — R1030 Lower abdominal pain, unspecified: Secondary | ICD-10-CM | POA: Diagnosis not present

## 2014-11-18 DIAGNOSIS — N760 Acute vaginitis: Secondary | ICD-10-CM | POA: Diagnosis not present

## 2014-11-18 DIAGNOSIS — S0990XA Unspecified injury of head, initial encounter: Secondary | ICD-10-CM

## 2014-11-18 DIAGNOSIS — F32A Depression, unspecified: Secondary | ICD-10-CM

## 2014-11-18 LAB — POCT UA - MICROSCOPIC ONLY
CASTS, UR, LPF, POC: NEGATIVE
Crystals, Ur, HPF, POC: NEGATIVE
MUCUS UA: POSITIVE
YEAST UA: NEGATIVE

## 2014-11-18 LAB — POCT URINALYSIS DIPSTICK
Bilirubin, UA: NEGATIVE
GLUCOSE UA: NEGATIVE
Leukocytes, UA: NEGATIVE
Nitrite, UA: POSITIVE
SPEC GRAV UA: 1.025
Urobilinogen, UA: 2
pH, UA: 6.5

## 2014-11-18 LAB — POCT WET PREP WITH KOH
KOH PREP POC: POSITIVE
Trichomonas, UA: NEGATIVE
YEAST WET PREP PER HPF POC: POSITIVE

## 2014-11-18 MED ORDER — CITALOPRAM HYDROBROMIDE 20 MG PO TABS
ORAL_TABLET | ORAL | Status: DC
Start: 2014-11-18 — End: 2014-12-04

## 2014-11-18 MED ORDER — METHOCARBAMOL 500 MG PO TABS: 500.0000 mg | ORAL_TABLET | Freq: Four times a day (QID) | ORAL | Status: DC

## 2014-11-18 MED ORDER — FLUCONAZOLE 150 MG PO TABS: 150.0000 mg | ORAL_TABLET | Freq: Once | ORAL | Status: DC
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
## 2014-11-19 ENCOUNTER — Emergency Department (HOSPITAL_COMMUNITY)
Admission: EM | Admit: 2014-11-19 | Discharge: 2014-11-19 | Disposition: A | Discharge status: Home / Self Care | Payer: BLUE CROSS/BLUE SHIELD | Attending: Emergency Medicine | Admitting: Emergency Medicine

## 2014-11-19 ENCOUNTER — Encounter (HOSPITAL_COMMUNITY): Payer: Self-pay | Admitting: Emergency Medicine

## 2014-11-19 ENCOUNTER — Emergency Department (HOSPITAL_COMMUNITY): Payer: BLUE CROSS/BLUE SHIELD

## 2014-11-19 DIAGNOSIS — Y9389 Activity, other specified: Secondary | ICD-10-CM | POA: Diagnosis not present

## 2014-11-19 DIAGNOSIS — Z8669 Personal history of other diseases of the nervous system and sense organs: Secondary | ICD-10-CM | POA: Diagnosis not present

## 2014-11-19 DIAGNOSIS — Z79899 Other long term (current) drug therapy: Secondary | ICD-10-CM | POA: Insufficient documentation

## 2014-11-19 DIAGNOSIS — J45909 Unspecified asthma, uncomplicated: Secondary | ICD-10-CM | POA: Insufficient documentation

## 2014-11-19 DIAGNOSIS — Z87442 Personal history of urinary calculi: Secondary | ICD-10-CM | POA: Diagnosis not present

## 2014-11-19 DIAGNOSIS — S0990XA Unspecified injury of head, initial encounter: Secondary | ICD-10-CM | POA: Diagnosis not present

## 2014-11-19 DIAGNOSIS — Y998 Other external cause status: Secondary | ICD-10-CM | POA: Diagnosis not present

## 2014-11-19 DIAGNOSIS — Y92410 Unspecified street and highway as the place of occurrence of the external cause: Secondary | ICD-10-CM | POA: Diagnosis not present

## 2014-11-19 DIAGNOSIS — F329 Major depressive disorder, single episode, unspecified: Secondary | ICD-10-CM | POA: Insufficient documentation

## 2014-11-19 DIAGNOSIS — S3992XA Unspecified injury of lower back, initial encounter: Secondary | ICD-10-CM | POA: Insufficient documentation

## 2014-11-19 DIAGNOSIS — S161XXA Strain of muscle, fascia and tendon at neck level, initial encounter: Secondary | ICD-10-CM | POA: Insufficient documentation

## 2014-11-19 DIAGNOSIS — F419 Anxiety disorder, unspecified: Secondary | ICD-10-CM | POA: Diagnosis not present

## 2014-11-19 LAB — URINE CULTURE: Culture: >=100000
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
## 2014-11-19 NOTE — ED Notes (Signed)
Patient transported to X-ray 
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
## 2014-11-21 ENCOUNTER — Telehealth (HOSPITAL_COMMUNITY): Payer: Self-pay

## 2014-11-21 NOTE — Telephone Encounter (Signed)
Post ED Visit - Positive Culture Follow-up: Chart Hand-off to ED Flow Manager  Culture assessed and recommendations reviewed by:  [name], Pharm.D., BCPS  Celedonio Miyamoto, Pharm.D., BCPS-AQ ID  Georgina Pillion, Pharm.D., BCPS  [place], [address].D., BCPS, AAHIVP  Estella Husk, Pharm .D., BCPS, AAHIVP  Tennis Must, Pharm.D.  Casilda Carls, Pharm.D.  Positive urine culture   Patient discharged without antimicrobial prescription and treatment is now indicated  Organism is resistant to prescribed ED discharge antimicrobial  Patient with positive blood cultures  Changes discussed with ED provider: Marlon Pel New antibiotic prescription Keflex  po bid x 7 days  Spoke with pt. Informed of labs. She has followed up with an ucc since her visit here.  There urine test came back normal. She does not want any medication at this time.   Ashley Jacobs 11/21/2014, 12:41 PM

## 2014-11-22 ENCOUNTER — Ambulatory Visit (INDEPENDENT_AMBULATORY_CARE_PROVIDER_SITE_OTHER): Payer: BLUE CROSS/BLUE SHIELD | Admitting: Family Medicine

## 2014-11-22 VITALS — BP 108/70 | HR 64 | Temp 98.4°F | Resp 16 | Ht 65.0 in | Wt 170.2 lb

## 2014-11-22 DIAGNOSIS — S29012A Strain of muscle and tendon of back wall of thorax, initial encounter: Secondary | ICD-10-CM | POA: Diagnosis not present

## 2014-11-22 DIAGNOSIS — M546 Pain in thoracic spine: Secondary | ICD-10-CM | POA: Diagnosis not present

## 2014-11-22 MED ORDER — HYDROCODONE-ACETAMINOPHEN 5-325 MG PO TABS: 1.0000 | ORAL_TABLET | Freq: Four times a day (QID) | ORAL | Status: DC | PRN

## 2014-11-22 NOTE — Patient Instructions (Addendum)
Take the muscle relaxant, methocarbamol, one pill breakfast, lunch, supper, and bedtime.  Take over-the-counter Aleve 2 pills twice daily with breakfast and supper  Take the Norco 5/325 one every 4-6 hours only if needed for severe pain. This will cause some sedation, best taken at nighttime.  Do gentle stretching exercises for your back  Use ice or heat or alternate ice and heat to the painful areas up your back several times daily  Stay off work through Wednesday night, can return Thursday night. If not improving or doing worse call.  Return as needed
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
## 2014-11-29 ENCOUNTER — Emergency Department (HOSPITAL_COMMUNITY)
Admission: EM | Admit: 2014-11-29 | Discharge: 2014-11-30 | Disposition: A | Discharge status: Other Acute Inpatient Hospital | Payer: BLUE CROSS/BLUE SHIELD | Attending: Emergency Medicine | Admitting: Emergency Medicine

## 2014-11-29 ENCOUNTER — Encounter (HOSPITAL_COMMUNITY): Payer: Self-pay | Admitting: *Deleted

## 2014-11-29 DIAGNOSIS — Z3202 Encounter for pregnancy test, result negative: Secondary | ICD-10-CM | POA: Diagnosis not present

## 2014-11-29 DIAGNOSIS — F329 Major depressive disorder, single episode, unspecified: Secondary | ICD-10-CM | POA: Diagnosis not present

## 2014-11-29 DIAGNOSIS — J45909 Unspecified asthma, uncomplicated: Secondary | ICD-10-CM | POA: Diagnosis not present

## 2014-11-29 DIAGNOSIS — F419 Anxiety disorder, unspecified: Secondary | ICD-10-CM | POA: Insufficient documentation

## 2014-11-29 DIAGNOSIS — Y9389 Activity, other specified: Secondary | ICD-10-CM | POA: Insufficient documentation

## 2014-11-29 DIAGNOSIS — Z79899 Other long term (current) drug therapy: Secondary | ICD-10-CM | POA: Insufficient documentation

## 2014-11-29 DIAGNOSIS — Y998 Other external cause status: Secondary | ICD-10-CM | POA: Diagnosis not present

## 2014-11-29 DIAGNOSIS — T50902A Poisoning by unspecified drugs, medicaments and biological substances, intentional self-harm, initial encounter: Secondary | ICD-10-CM

## 2014-11-29 DIAGNOSIS — Z8669 Personal history of other diseases of the nervous system and sense organs: Secondary | ICD-10-CM | POA: Insufficient documentation

## 2014-11-29 DIAGNOSIS — F1210 Cannabis abuse, uncomplicated: Secondary | ICD-10-CM | POA: Diagnosis not present

## 2014-11-29 DIAGNOSIS — Y9289 Other specified places as the place of occurrence of the external cause: Secondary | ICD-10-CM | POA: Insufficient documentation

## 2014-11-29 DIAGNOSIS — Z87442 Personal history of urinary calculi: Secondary | ICD-10-CM | POA: Diagnosis not present

## 2014-11-29 DIAGNOSIS — T428X2A Poisoning by antiparkinsonism drugs and other central muscle-tone depressants, intentional self-harm, initial encounter: Secondary | ICD-10-CM | POA: Insufficient documentation

## 2014-11-29 LAB — COMPREHENSIVE METABOLIC PANEL
ALBUMIN: 4.7 g/dL (ref 3.5–5.0)
ALT: 10 U/L — ABNORMAL LOW (ref 14–54)
AST: 14 U/L — AB (ref 15–41)
Alkaline Phosphatase: 54 U/L (ref 38–126)
Anion gap: 11 (ref 5–15)
BUN: 7 mg/dL (ref 6–20)
CHLORIDE: 104 mmol/L (ref 101–111)
CO2: 22 mmol/L (ref 22–32)
Calcium: 9.4 mg/dL (ref 8.9–10.3)
Creatinine, Ser: 0.69 mg/dL (ref 0.44–1.00)
GFR calc Af Amer: >60 mL/min (ref >60)
GLUCOSE: 79 mg/dL (ref 65–99)
POTASSIUM: 3.1 mmol/L — AB (ref 3.5–5.1)
SODIUM: 137 mmol/L (ref 135–145)
Total Bilirubin: 1.9 mg/dL — ABNORMAL HIGH (ref 0.3–1.2)
Total Protein: 8.4 g/dL — ABNORMAL HIGH (ref 6.5–8.1)

## 2014-11-29 LAB — RAPID URINE DRUG SCREEN, HOSP PERFORMED
AMPHETAMINES: NOT DETECTED
BARBITURATES: NOT DETECTED
BENZODIAZEPINES: NOT DETECTED
Cocaine: NOT DETECTED
Opiates: NOT DETECTED
TETRAHYDROCANNABINOL: POSITIVE — AB

## 2014-11-29 LAB — URINALYSIS, ROUTINE W REFLEX MICROSCOPIC
BILIRUBIN URINE: NEGATIVE
GLUCOSE, UA: NEGATIVE mg/dL
HGB URINE DIPSTICK: NEGATIVE
Ketones, ur: >80 mg/dL — AB
Nitrite: NEGATIVE
PH: 6.5 (ref 5.0–8.0)
Protein, ur: NEGATIVE mg/dL
SPECIFIC GRAVITY, URINE: 1.012 (ref 1.005–1.030)
UROBILINOGEN UA: 0.2 mg/dL (ref 0.0–1.0)

## 2014-11-29 LAB — URINE MICROSCOPIC-ADD ON

## 2014-11-29 LAB — CBC WITH DIFFERENTIAL/PLATELET
BASOS ABS: 0.1 10*3/uL (ref 0.0–0.1)
Basophils Relative: 1 %
Eosinophils Absolute: 0 10*3/uL (ref 0.0–0.7)
Eosinophils Relative: 1 %
HEMATOCRIT: 41.9 % (ref 36.0–46.0)
Hemoglobin: 13.5 g/dL (ref 12.0–15.0)
LYMPHS PCT: 36 %
Lymphs Abs: 2.4 10*3/uL (ref 0.7–4.0)
MCH: 26.4 pg (ref 26.0–34.0)
MCHC: 32.2 g/dL (ref 30.0–36.0)
MCV: 82 fL (ref 78.0–100.0)
MONO ABS: 0.5 10*3/uL (ref 0.1–1.0)
Monocytes Relative: 7 %
NEUTROS ABS: 3.6 10*3/uL (ref 1.7–7.7)
Neutrophils Relative %: 55 %
Platelets: 252 10*3/uL (ref 150–400)
RBC: 5.11 MIL/uL (ref 3.87–5.11)
RDW: 14.3 % (ref 11.5–15.5)
WBC: 6.5 10*3/uL (ref 4.0–10.5)

## 2014-11-29 LAB — POC URINE PREG, ED: Preg Test, Ur: NEGATIVE

## 2014-11-29 LAB — SALICYLATE LEVEL

## 2014-11-29 LAB — ACETAMINOPHEN LEVEL
Acetaminophen (Tylenol), Serum: 50 ug/mL — ABNORMAL HIGH (ref 10–30)
Acetaminophen (Tylenol), Serum: 35 ug/mL — ABNORMAL HIGH (ref 10–30)

## 2014-11-29 LAB — ETHANOL

## 2014-11-29 MED ORDER — ONDANSETRON HCL 4 MG/2ML IJ SOLN
4.0000 mg | Freq: Once | INTRAMUSCULAR | Status: AC
Start: 2014-11-29 — End: 2014-11-29
  Administered 2014-11-29: 4 mg via INTRAVENOUS
  Filled 2014-11-29: qty 2

## 2014-11-29 NOTE — ED Notes (Signed)
Patient ambulated to SAPU with belongings.

## 2014-11-29 NOTE — ED Notes (Signed)
Patient was vomiting about 10 minutes ago but is now resting quietly with eyes closed. Sitter remains at bedside.

## 2014-11-29 NOTE — ED Provider Notes (Signed)
[name]     Arrival date & time 11/29/14  1602 History   First MD Initiated Contact with Patient 11/29/14 1639     Chief Complaint  Patient presents with  . Drug Overdose     (Consider location/radiation/quality/duration/timing/severity/associated sxs/prior Treatment) Patient is a 39 y.o. female presenting with mental health disorder. The history is provided by the patient.  Mental Health Problem Presenting symptoms: suicidal thoughts, suicidal threats and suicide attempt   Patient accompanied by:  Law enforcement Degree of incapacity (severity):  Moderate Onset quality:  Gradual Duration:  1 day Timing:  Constant Progression:  Unchanged Chronicity:  New Context: drug abuse and stressful life event   Relieved by:  Nothing Worsened by:  Nothing tried Ineffective treatments:  None tried Associated symptoms: anxiety, feelings of worthlessness, irritability and poor judgment   Associated symptoms: no abdominal pain   Risk factors: hx of mental illness     Past Medical History  Diagnosis Date  . Allergy   . Sleep apnea   . Anxiety   . Depression   . Asthma   . Abortion 03/09/2013  . Pregnancy induced hypertension   . Kidney stone    Past Surgical History  Procedure Laterality Date  . Mouth surgery  1999  . Cervical cerclage      all 3 pregnancies  . Dilation and curettage of uterus  2001    retained placenta  . Dilation and curettage of uterus  2006    SAB  . Tubal ligation     Family History  Problem Relation Age of Onset  . HIV Mother   . Seizures Mother     secondary to drug abuse  . Drug abuse Mother   . Heart disease Maternal Grandfather   . Diabetes Paternal Grandmother   . Cancer Paternal Grandfather    Social History  Substance Use Topics  . Smoking status: Never Smoker   . Smokeless tobacco: Never Used  . Alcohol Use: Yes     Comment: less than once a week-holidays, birthday   OB History    Gravida Para Term Preterm AB TAB SAB Ectopic  Multiple Living   0 0 1     Review of Systems  Constitutional: Positive for irritability.  Gastrointestinal: Negative for abdominal pain.  Psychiatric/Behavioral: Positive for suicidal ideas. The patient is nervous/anxious.   All other systems reviewed and are negative.     Allergies  Stadol and Kale  Home Medications   Prior to Admission medications   Medication Sig Start Date End Date Taking? Authorizing Provider  methocarbamol (ROBAXIN) 500 MG tablet Take 1 tablet (500 mg total) by mouth 4 (four) times daily. 11/18/14  Yes Peyton Najjar, MD  citalopram (CELEXA) 20 MG tablet Take 1/2 daily for 2 weeks, then 1 daily for depression 11/18/14   Peyton Najjar, MD  fluconazole (DIFLUCAN) 150 MG tablet Take 1 tablet (150 mg total) by mouth once. Patient not taking: Reported on 11/29/2014 11/18/14   Peyton Najjar, MD  HYDROcodone-acetaminophen [facility]) 5-325 MG per tablet Take 1 tablet by mouth every 6 (six) hours as needed. Patient not taking: Reported on 11/29/2014 11/22/14   Peyton Najjar, MD   BP 102/60 mmHg  Pulse 47  Temp(Src) 99.4 F (37.4 C) (Oral)  Resp 17  SpO2 97%  LMP 11/04/2014 (Exact Date) Physical Exam  Constitutional: She is oriented to person, place, and time. She appears well-developed and well-nourished. No distress.  HENT:  Head: Normocephalic.  Eyes: Conjunctivae are normal.  Neck: Neck supple. No tracheal deviation present.  Cardiovascular: Normal rate, regular rhythm and normal heart sounds.   Pulmonary/Chest: Effort normal and breath sounds normal. No respiratory distress.  Abdominal: Soft. She exhibits no distension.  Neurological: She is alert and oriented to person, place, and time. No cranial nerve deficit. GCS eye subscore is 4. GCS verbal subscore is 5. GCS motor subscore is 6.  Skin: Skin is warm and dry.  Psychiatric: Her speech is slurred. She is slowed. She exhibits a depressed mood.    ED Course  Procedures (including critical  care time) Labs Review Labs Reviewed  COMPREHENSIVE METABOLIC PANEL - Abnormal; Notable for the following:    Potassium 3.1 (*)    Total Protein 8.4 (*)    AST 14 (*)    ALT 10 (*)    Total Bilirubin 1.9 (*)    All other components within normal limits  URINE RAPID DRUG SCREEN, HOSP PERFORMED - Abnormal; Notable for the following:    Tetrahydrocannabinol POSITIVE (*)    All other components within normal limits  ACETAMINOPHEN LEVEL - Abnormal; Notable for the following:    Acetaminophen (Tylenol), Serum 50 (*)    All other components within normal limits  URINALYSIS, ROUTINE W REFLEX MICROSCOPIC (NOT AT [facility]) - Abnormal; Notable for the following:    APPearance CLOUDY (*)    Ketones, ur >80 (*)    Leukocytes, UA SMALL (*)    All other components within normal limits  URINE MICROSCOPIC-ADD ON - Abnormal; Notable for the following:    Squamous Epithelial / LPF MANY (*)    Bacteria, UA FEW (*)    All other components within normal limits  ACETAMINOPHEN LEVEL - Abnormal; Notable for the following:    Acetaminophen (Tylenol), Serum 35 (*)    All other components within normal limits  ETHANOL  CBC WITH DIFFERENTIAL/PLATELET  SALICYLATE LEVEL  POC URINE PREG, ED    Imaging Review No results found. I have personally reviewed and evaluated these images and lab results as part of my medical decision-making.   EKG Interpretation   Date/Time:  Tuesday November 29 2014 16:19:41 EDT Ventricular Rate:  57 PR Interval:  152 QRS Duration: 89 QT Interval:  420 QTC Calculation: 409 R Axis:   32 Text Interpretation:  Sinus rhythm Baseline wander in lead(s) V6 Confirmed  by KNOTT MD, Reuel Boom ([id number]) on 11/29/2014 4:41:51 PM      MDM   Final diagnoses:  Suicide attempt by drug ingestion, initial encounter    39 year old female presents with Robaxin ingestion in an attempt to commit suicide by sitting in a bathtub after taking it. Possible multiple other agents ingested but no other  empty bottles were found at the scene. Patient had cried out for help to her husband over the phone prior to taking pills.  No acute EKG or serologic evidence of significant coingestions, Tylenol level is down trending and 35 at 4 hours. Patient is otherwise hemodynamically stable and is at her normal state of sinus bradycardia at her baseline. 24-hour emergency psychiatric hold initiated and TTS consulted for admission.  MEDICALLY CLEAR FOR TRANSFER OR PSYCHIATRIC ADMISSION.   Lyndal Pulley, MD 11/29/14 [phone]

## 2014-11-29 NOTE — ED Notes (Signed)
Pt is vomiting. Notified Jadene Stemmer. New order obtained.

## 2014-11-29 NOTE — ED Notes (Signed)
Provided pillow and warm blanket. No other needs voiced by patient.

## 2014-11-29 NOTE — ED Notes (Signed)
Mercy, RN with Lauraine Rinne is going to call back when avaiable to take report for patient. Also, Arlys John, NT is obtaining tele psych machine for consult.

## 2014-11-29 NOTE — ED Notes (Signed)
Bed: [facility] Expected date:  Expected time:  Means of arrival:  Comments: Ems  

## 2014-11-29 NOTE — ED Notes (Signed)
Poison Control:  Symptomatic care---  IVF for hypotension, seizure precaution, cardiac monitor and evaluate 6-8 hours.  Watch for prolonged QRS and give NaCO3.  Testing - primary labs, urine and EKG
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
## 2014-11-29 NOTE — ED Notes (Signed)
Pt took an unknown amount of pills, is uncooperative & doesn't want to answer questions.

## 2014-11-29 NOTE — ED Notes (Signed)
Patient having TTS consult.  ° °

## 2014-11-29 NOTE — ED Notes (Signed)
Pt ambulated to the BR with one assist. 
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
## 2014-11-29 NOTE — ED Notes (Signed)
Per [facility], pt got into an argument with husband and took pills.  She had a bottle of robaxin and celexa, robaxin is empty sitting in the house.  Her husband went to p/u the child and when he returned he found her trying to drown herself in the bathtub.

## 2014-11-30 ENCOUNTER — Encounter (HOSPITAL_COMMUNITY): Payer: Self-pay | Admitting: *Deleted

## 2014-11-30 ENCOUNTER — Inpatient Hospital Stay (HOSPITAL_COMMUNITY)
Admission: EM | Admit: 2014-11-30 | Discharge: 2014-12-04 | DRG: 885 | Disposition: A | Discharge status: Home / Self Care | Payer: BLUE CROSS/BLUE SHIELD | Source: Intra-hospital | Attending: Psychiatry | Admitting: Psychiatry

## 2014-11-30 DIAGNOSIS — G4700 Insomnia, unspecified: Secondary | ICD-10-CM | POA: Diagnosis present

## 2014-11-30 DIAGNOSIS — F332 Major depressive disorder, recurrent severe without psychotic features: Principal | ICD-10-CM | POA: Diagnosis present

## 2014-11-30 DIAGNOSIS — F339 Major depressive disorder, recurrent, unspecified: Secondary | ICD-10-CM | POA: Diagnosis present

## 2014-11-30 DIAGNOSIS — E876 Hypokalemia: Secondary | ICD-10-CM | POA: Diagnosis present

## 2014-11-30 DIAGNOSIS — R45851 Suicidal ideations: Secondary | ICD-10-CM | POA: Diagnosis present

## 2014-11-30 MED ORDER — CITALOPRAM HYDROBROMIDE 10 MG PO TABS
10.0000 mg | ORAL_TABLET | Freq: Every day | ORAL | Status: DC
  Administered 2014-11-30 – 2014-12-01 (×2): 10 mg via ORAL
  Filled 2014-11-30 (×5): qty 1

## 2014-11-30 MED ORDER — BOOST / RESOURCE BREEZE PO LIQD
1.0000 | Freq: Two times a day (BID) | ORAL | Status: DC
  Filled 2014-11-30 (×11): qty 1

## 2014-11-30 MED ORDER — ALUM & MAG HYDROXIDE-SIMETH 200-200-20 MG/5ML PO SUSP: 30.0000 mL | ORAL | Status: DC | PRN

## 2014-11-30 MED ORDER — HYDROXYZINE HCL 25 MG PO TABS: 25.0000 mg | ORAL_TABLET | Freq: Four times a day (QID) | ORAL | Status: DC | PRN

## 2014-11-30 MED ORDER — TRAZODONE HCL 50 MG PO TABS
50.0000 mg | ORAL_TABLET | Freq: Every evening | ORAL | Status: DC | PRN
  Administered 2014-11-30: 50 mg via ORAL
  Filled 2014-11-30 (×6): qty 1

## 2014-11-30 MED ORDER — MAGNESIUM HYDROXIDE 400 MG/5ML PO SUSP: 30.0000 mL | Freq: Every day | ORAL | Status: DC | PRN

## 2014-11-30 MED ORDER — POTASSIUM CHLORIDE CRYS ER 10 MEQ PO TBCR
10.0000 meq | EXTENDED_RELEASE_TABLET | Freq: Three times a day (TID) | ORAL | Status: AC
  Administered 2014-11-30 – 2014-12-01 (×4): 10 meq via ORAL
  Filled 2014-11-30 (×5): qty 1

## 2014-11-30 MED ORDER — MIRTAZAPINE 15 MG PO TABS
15.0000 mg | ORAL_TABLET | Freq: Every day | ORAL | Status: DC
  Administered 2014-11-30 – 2014-12-03 (×4): 15 mg via ORAL
  Filled 2014-11-30 (×5): qty 1

## 2014-11-30 NOTE — Progress Notes (Signed)
D: Patient in her room in bed on approach.  Patient appeared to ignore Clinical research associate when Clinical research associate greeted patient.  Patient does appear irritable and states she does not want to be bothered.  Patient does not engage in conversation with Clinical research associate.  Patient denies SI/HI and denies AVH. A: Staff to monitor Q 15 mins for safety.  Encouragement and support offered.  Scheduled medications administered per orders. R: Patient remains safe on the unit.  Patient did not attend group tonight.  Patient not visible on the unit and interacting with peers.  Patient taking administered medications.

## 2014-11-30 NOTE — Tx Team (Signed)
Initial Interdisciplinary Treatment Plan   PATIENT STRESSORS: Marital or family conflict Medication change or noncompliance Substance abuse   PATIENT STRENGTHS: Ability for insight Average or above average intelligence Communication skills General fund of knowledge Religious Affiliation Supportive family/friends Work skills   PROBLEM LIST: Problem List/Patient Goals Date to be addressed Date deferred Reason deferred Estimated date of resolution  "I took a bunch of muscle relaxers intending to go to sleep in the shower" 11-30-14     Substance abuse 11-30-14     Depression 11-30-14     Marital issues/seperated 11-30-14                                    DISCHARGE CRITERIA:  Adequate post-discharge living arrangements Improved stabilization in mood, thinking, and/or behavior Need for constant or close observation no longer present Reduction of life-threatening or endangering symptoms to within safe limits Verbal commitment to aftercare and medication compliance  PRELIMINARY DISCHARGE PLAN: Attend PHP/IOP Participate in family therapy Return to previous living arrangement Return to previous work or school arrangements  PATIENT/FAMIILY INVOLVEMENT: This treatment plan has been presented to and reviewed with the patient, Morgan [name], and/or family member,.  The patient and family have been given the opportunity to ask questions and make suggestions.  [name] 11/30/2014, 5:09 AM
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
## 2014-11-30 NOTE — Progress Notes (Signed)
EKG COMPLETED AND PLACED ON MD DESK FOR REVIEW.  

## 2014-11-30 NOTE — Progress Notes (Signed)
D:  Patient denied SI and HI, contracts for safety.  Denied A/V hallucinations.  Denied pain. A:  No medications administered this morning.  Emotional support and encouragement given patient. R:  Safety maintained with 15 minute checks. Patient refused boost this morning. \
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
## 2014-11-30 NOTE — Plan of Care (Signed)
Problem: Consults Goal: Depression Patient Education See Patient Education Module for education specifics.  Outcome: Progressing Nurse discussed depression/coping skills with patient.        

## 2014-11-30 NOTE — [facility] Group Notes (Signed)
[facility] LCSW Group Therapy 11/30/2014 1:15 PM  Type of Therapy: Group Therapy- Emotion Regulation  Pt did not attend, declined invitation.   Chad Cordial, LCSWA 11/30/2014 5:06 PM

## 2014-11-30 NOTE — Progress Notes (Signed)
NUTRITION ASSESSMENT  Pt identified as at risk on the Malnutrition Screen Tool  INTERVENTION: 1. Educated patient on the importance of nutrition and encouraged intake of food and beverages. 2. Discussed weight goals. 3. Supplements: will order Boost Breeze BID, each supplement provides 250 kcal and 9 grams of protein  NUTRITION DIAGNOSIS: Unintentional weight loss related to sub-optimal intake as evidenced by pt report.   Goal: Pt to meet >/= 90% of their estimated nutrition needs.  Monitor:  PO intake  Assessment:  Pt seen for MST. Pt admitted after drug OD for attempted suicide. Per report, pt uses marijuana. She has been depressed with decreased appetite and intakes.   Chart review indicates 7 lb weight loss (4% body weight) in the past 2 weeks which is significant for time frame. Will order Boost Breeze to supplement.  39 y.o. female  Height: Ht Readings from Last 1 Encounters:  11/30/14  (1.676 m)    Weight: Wt Readings from Last 1 Encounters:  11/30/14 165 lb (74.844 kg)    Weight Hx: Wt Readings from Last 10 Encounters:  11/30/14 165 lb (74.844 kg)  11/22/14 170 lb 3.2 oz (77.202 kg)  11/19/14 170 lb (77.111 kg)  11/18/14 171 lb (77.565 kg)  11/16/14 172 lb 4.8 oz (78.155 kg)  10/12/14 174 lb 3.2 oz (79.017 kg)  09/08/14 179 lb (81.194 kg)  09/01/14 181 lb 6.4 oz (82.283 kg)  06/16/14 185 lb 2 oz (83.972 kg)  04/19/14 190 lb 3.2 oz (86.274 kg)    BMI:  Body mass index is 26.64 kg/(m^2). Pt meets criteria for over weight based on current BMI.  Estimated Nutritional Needs: Kcal: 25-30 kcal/kg Protein: > 1 gram protein/kg Fluid: 1 ml/kcal  Diet Order: Diet regular Room service appropriate?: Yes; Fluid consistency:: Thin Pt is also offered choice of unit snacks mid-morning and mid-afternoon.  Pt is eating as desired.   Lab results and medications reviewed.      Trenton Gammon, RD, LDN Inpatient Clinical Dietitian Pager # [phone] After  hours/weekend pager # [phone]

## 2014-11-30 NOTE — [facility] Group Notes (Signed)
[facility] LCSW Aftercare Discharge Planning Group Note  11/30/2014 8:45 AM  Pt did not attend, declined invitation.   Chad Cordial, LCSWA 11/30/2014 1:30 PM

## 2014-12-01 LAB — BASIC METABOLIC PANEL
ANION GAP: 6 (ref 5–15)
BUN: 10 mg/dL (ref 6–20)
CALCIUM: 9.4 mg/dL (ref 8.9–10.3)
CO2: 28 mmol/L (ref 22–32)
Chloride: 105 mmol/L (ref 101–111)
Creatinine, Ser: 0.85 mg/dL (ref 0.44–1.00)
GFR calc Af Amer: >60 mL/min (ref >60)
GFR calc non Af Amer: >60 mL/min (ref >60)
GLUCOSE: 85 mg/dL (ref 65–99)
Potassium: 3.4 mmol/L — ABNORMAL LOW (ref 3.5–5.1)
Sodium: 139 mmol/L (ref 135–145)

## 2014-12-01 LAB — TSH: TSH: 0.948 u[IU]/mL (ref 0.350–4.500)

## 2014-12-01 MED ORDER — TRAZODONE HCL 50 MG PO TABS
25.0000 mg | ORAL_TABLET | Freq: Every evening | ORAL | Status: DC | PRN
  Administered 2014-12-01 – 2014-12-03 (×3): 25 mg via ORAL
  Filled 2014-12-01 (×2): qty 1

## 2014-12-01 MED ORDER — CITALOPRAM HYDROBROMIDE 20 MG PO TABS
20.0000 mg | ORAL_TABLET | Freq: Every day | ORAL | Status: DC
  Administered 2014-12-02 – 2014-12-04 (×3): 20 mg via ORAL
  Filled 2014-12-01 (×4): qty 1

## 2014-12-01 NOTE — [facility] Group Notes (Signed)
[facility] Group Therapy 12/01/2014 1:15pm  Type of Therapy: Mental Health Association Presentation  Pt attended group but slept throughout session.    Chad Cordial, LCSWA 12/01/2014 1:44 PM

## 2014-12-01 NOTE — Progress Notes (Signed)
D: Patient in her room awake in bed on approach.  Patient had spontaneous conversation with Clinical research associate today.  Patient appears flat and depressed but when talking about her life patient brightens and becomes animated.  Patient states, "I think I just needed some sleep."  Patient states she has a busy work schedule along with taking and picking her daughter up from school.  Patient denies SI/HI and denies AVH.  Patient did state, "I am a loner and I don't do people sometimes."  Patient did states she was more visible on the until tonight and she interacted with peers. A: Staff to monitor Q 15 mins for safety.  Encouragement and support offered.  Scheduled medications administered per orders. R: Patient remains safe on the unit.  Patient attended group tonight.  Patient visible on the unit and interacting with peers.  Patient taking administered medications.

## 2014-12-01 NOTE — Progress Notes (Addendum)
D: Morgan [name] has remained in bed most of the day, reluctantly getting out when asked repeatedly. She is somewhat irritable. She denies SI/HI/AVH and ranks depression 5, anxiety 0 and hopelessness 3. She filled out her self-inventory after repeated prodding. She listed her goal as "going home and also wrote the following: "I'm not a people person; I'm a loner. Group settings with unfamiliar people are uncomfortable."  A: Meds given as ordered. Nutritional supplement refused. Q15 safety checks maintained. Support/encouragement offered. R: Pt remains free from harm and continues with treatment. Will continue to monitor for needs/safety.

## 2014-12-01 NOTE — [facility] Suicide Risk Assessment (Deleted)
[facility] INPATIENT:  Family/Significant Other Suicide Prevention Education  Suicide Prevention Education:  Patient Refusal for Family/Significant Other Suicide Prevention Education: The patient Morgan [name] has refused to provide written consent for family/significant other to be provided Family/Significant Other Suicide Prevention Education during admission and/or prior to discharge.  Physician notified. SPE reviewed with patient and brochure provided. Patient encouraged to return to hospital if having suicidal thoughts, patient verbalized his/her understanding and has no further questions at this time.   Elaina Hoops 12/01/2014, 11:23 AM

## 2014-12-01 NOTE — Progress Notes (Signed)
[facility] MD Progress Note  12/01/2014 1:11 PM Morgan [name]  MRN:  [id number] Subjective:   Patient reports ongoing depression. At this time  Reports AM sedation related to QHS Trazodone . Denies other side effects.  States she had a good visit with her daughter yesterday evening which helped her feel better, and states she would not consider suicide again due to her love for her daughter . Of note, today not complaining of headache .  Objective : I have discussed case with treatment team and have met with patient.  As per notes /staff, patient has tended to isolate in room, with limited group participation, and has presented irritable and withdrawn upon approach. No agitated or disruptive behaviors on unit. At this time reports ongoing depression,  Presents vaguely irritable , with minimal eye contact . States she feels " tired and wants to sleep". As above, states " Trazodone helped me sleep , but I feel sleepy this AM". She denies any current suicidal ideations or hallucinations . She contracts for safety on the unit. When encouraged to participate in groups, be more active in milieu, stated " I am more of a loner, it is hard for me to trust people right away, I need time to feel comfortable". Tends to ruminate about relationship stressors . TSH WNL, BMP unremarkable , except for K (+) 3.4    Principal Problem: MDD (major depressive disorder), recurrent episode, severe Diagnosis:   Patient Active Problem List   Diagnosis Date Noted  . MDD (major depressive disorder), recurrent episode, severe [F33.2] 11/30/2014  . Ureterolithiasis [N20.1] 04/19/2014  . Migraine [G43.909] 03/18/2013  . OSA on CPAP [G47.33] 03/18/2013   Total Time spent with patient: 25 minutes     Past Medical History:  Past Medical History  Diagnosis Date  . Allergy   . Sleep apnea   . Anxiety   . Depression   . Asthma   . Abortion 03/09/2013  . Pregnancy induced hypertension   . Kidney stone     Past  Surgical History  Procedure Laterality Date  . Mouth surgery  1999  . Cervical cerclage      all 3 pregnancies  . Dilation and curettage of uterus  2001    retained placenta  . Dilation and curettage of uterus  2006    SAB  . Tubal ligation     Family History:  Family History  Problem Relation Age of Onset  . HIV Mother   . Seizures Mother     secondary to drug abuse  . Drug abuse Mother   . Heart disease Maternal Grandfather   . Diabetes Paternal Grandmother   . Cancer Paternal Grandfather     Social History:  History  Alcohol Use  . Yes    Comment: less than once a week-holidays, birthday     History  Drug Use  . 1.00 per week  . Special: Marijuana    Social History   Social History  . Marital Status: Married    Spouse Name: [name]  . Number of Children: 1  . Years of Education: 12   Occupational History  . 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 Smoking status: Never Smoker   . Smokeless tobacco: Never Used  . Alcohol Use: Yes     Comment: less than once a week-holidays, birthday  . Drug Use: 1.00 per week    Special: Marijuana  . Sexual Activity:    Partners: Male  Birth Control/ Protection: None   Other Topics Concern  . None   Social History Narrative   Lives with her husband and their daughter (born 2007).   Additional Social History:   Sleep: Good  Appetite:  Fair  Current Medications: Current Facility-Administered Medications  Medication Dose Route Frequency Provider Last Rate Last Dose  . alum & mag hydroxide-simeth (MAALOX/MYLANTA) 200-200-20 MG/5ML suspension 30 mL  30 mL Oral Q4H PRN Laverle Hobby, PA-C      . citalopram (CELEXA) tablet 10 mg  10 mg Oral Daily Jenne Campus, MD   10 mg at 12/01/14 0819  . feeding supplement (BOOST / RESOURCE BREEZE) liquid 1 Container  1 Container Oral BID BM Maricela Bo Ostheim, RD      . hydrOXYzine (ATARAX/VISTARIL) tablet 25 mg  25 mg Oral Q6H PRN Laverle Hobby, PA-C       . magnesium hydroxide (MILK OF MAGNESIA) suspension 30 mL  30 mL Oral Daily PRN Laverle Hobby, PA-C      . mirtazapine (REMERON) tablet 15 mg  15 mg Oral QHS Jenne Campus, MD   15 mg at 11/30/14 2255  . potassium chloride (K-DUR,KLOR-CON) CR tablet 10 mEq  10 mEq Oral TID Jenne Campus, MD   10 mEq at 12/01/14 1305  . traZODone (DESYREL) tablet 50 mg  50 mg Oral QHS,MR X 1 Laverle Hobby, PA-C   50 mg at 11/30/14 2255    Lab Results:  Results for orders placed or performed during the hospital encounter of 11/30/14 (from the past 48 hour(s))  TSH     Status: None   Collection Time: 12/01/14  6:31 AM  Result Value Ref Range   TSH 0.948 0.350 - 4.500 uIU/mL    Comment: Performed at [facility] metabolic panel     Status: Abnormal   Collection Time: 12/01/14  6:31 AM  Result Value Ref Range   Sodium 139 135 - 145 mmol/L   Potassium 3.4 (L) 3.5 - 5.1 mmol/L   Chloride 105 101 - 111 mmol/L   CO2 28 22 - 32 mmol/L   Glucose, Bld 85 65 - 99 mg/dL   BUN 10 6 - 20 mg/dL   Creatinine, Ser 0.85 0.44 - 1.00 mg/dL   Calcium 9.4 8.9 - 10.3 mg/dL   GFR calc non Af Amer >60 >60 mL/min   GFR calc Af Amer >60 >60 mL/min    Comment: (NOTE) The eGFR has been calculated using the CKD EPI equation. This calculation has not been validated in all clinical situations. eGFR's persistently <60 mL/min signify possible Chronic Kidney Disease.    Anion gap 6 5 - 15    Comment: Performed at [facility]    Physical Findings: AIMS: Facial and Oral Movements Muscles of Facial Expression: None, normal Lips and Perioral Area: None, normal Jaw: None, normal Tongue: None, normal,Extremity Movements Upper (arms, wrists, hands, fingers): None, normal Lower (legs, knees, ankles, toes): None, normal, Trunk Movements Neck, shoulders, hips: None, normal, Overall Severity Severity of abnormal movements (highest score from questions above): None,  normal Incapacitation due to abnormal movements: None, normal Patient's awareness of abnormal movements (rate only patient's report): No Awareness, Dental Status Current problems with teeth and/or dentures?: No Does patient usually wear dentures?: No  CIWA:  CIWA-Ar Total: 1 COWS:  COWS Total Score: 1  Musculoskeletal: Strength & Muscle Tone: within normal limits Gait & Station: normal Patient leans: N/A  Psychiatric Specialty Exam: ROS- at this time does not endorse headache, does not endorse chest pain or shortness of breath, does not endorse vomiting   Blood pressure 120/87, pulse 62, temperature 98.6 F (37 C), temperature source Oral, resp. rate 16, height _0  (1.676 m), weight 165 lb (74.844 kg), last menstrual period 11/04/2014.Body mass index is 26.64 kg/(m^2).  General Appearance: Fairly Groomed  Engineer, water::  Minimal  Speech:  Normal Rate  Volume:  Decreased  Mood:  depressed, irritable   Affect:  Congruent  Thought Process:  Linear  Orientation:  Full (Time, Place, and Person)  Thought Content:  denies hallucinations, no delusions, does not appear internally preoccupied   Suicidal Thoughts:  No- at this time denies any active SI , contracts for safety on unit, today describes her love for daughter as a protective factor from hurting self   Homicidal Thoughts:  No  Memory:  recent and remote grossly intact   Judgement:  Fair  Insight:  Fair  Psychomotor Activity:  Decreased  Concentration:  Good  Recall:  Good  Fund of Knowledge:Good  Language: Good  Akathisia:  Negative  Handed:  Right  AIMS (if indicated):     Assets:  Communication Skills Resilience  ADL's: fair   Cognition: WNL  Sleep:  Number of Hours: 6.75  Assessment - patient presents depressed, dysphoric and vaguely irritable. She has been isolating in room, with little milieu participation thus far. Attributes this to being a " loner" by nature, and to having difficulties trusting  People in  general. She does seem somewhat improved compared to admission , and today is less irritable than when she was admitted and is denying any suicidal ideations, and identifying her love for daughter as a protective factor from hurting self . Tolerating medications well  at this time, but reports excessive AM sedation related to Trazodone- does not want to stop it , though, as did sleep better  .  Treatment Plan Summary: Daily contact with patient to assess and evaluate symptoms and progress in treatment, Medication management, Plan inpatient admission and medicaitons as below  Decrease  Trazodone to 25 mgrs QHS for insomnia, in order to minimize risk of AM sedation. Increase Celexa to 20 mgrs QDAY to address depression and PTSD symptoms Continue Remeron 15 mgrs QHS to address depression Continue Vistaril 25 mgrs Q 6 hours PRN for anxiety or agitation as needed  Encourage milieu , group participation to address coping skills, social skills, and help with symptom reduction.  Morgan [name], [place] 12/01/2014, 1:11 PM

## 2014-12-01 NOTE — Progress Notes (Signed)
Patient did attend the evening karaoke group. Pt was engaged, supportive, and participated by singing a song.  

## 2014-12-01 NOTE — [facility] Group Notes (Signed)
[facility] Group Notes:  (Nursing/MHT/Case Management/Adjunct)  Date:  12/01/2014  Time:  0900  Type of Therapy:  Nurse Education  Participation Level:  Did Not Attend  Participation Quality:    Affect:    Cognitive:    Insight:    Engagement in Group:    Modes of Intervention:    Summary of Progress/Problems: Pt was advised of group's start twice and chose not to attend.  Maurine Simmering 12/01/2014, 9:58 AM

## 2014-12-01 NOTE — Tx Team (Signed)
Interdisciplinary Treatment Plan Update (Adult) Date: 12/01/2014   Date: 12/01/2014 1:45 PM  Progress in Treatment:  Attending groups: No Participating in groups: No Taking medication as prescribed: Yes  Tolerating medication: Yes  Family/Significant othe contact made: No, Pt declines family contact Patient understands diagnosis: Yes Discussing patient identified problems/goals with staff: Yes  Medical problems stabilized or resolved: Yes  Denies suicidal/homicidal ideation: Yes Patient has not harmed self or Others: Yes   New problem(s) identified: None identified at this time.   Discharge Plan or Barriers: Pt will return home and follow-up with [place].  Additional comments: n/a   Reason for Continuation of Hospitalization:  Depression Medication stabilization Suicidal ideation  Estimated length of stay: 3-5 days  Review of initial/current patient goals per problem list:   1.  Goal(s): Patient will participate in aftercare plan  Met:  Yes  Target date: 3-5 days from date of admission   As evidenced by: Patient will participate within aftercare plan AEB aftercare provider and housing plan at discharge being identified.   12/01/14: Pt will return home and follow-up with [place]  2.  Goal (s): Patient will exhibit decreased depressive symptoms and suicidal ideations.  Met:  No  Target date: 3-5 days from date of admission   As evidenced by: Patient will utilize self rating of depression at 3 or below and demonstrate decreased signs of depression or be deemed stable for discharge by MD. 12/01/14: Pt was admitted with symptoms of depression, rating 10/10. Pt continues to present with flat affect and depressive symptoms.  Pt will demonstrate decreased symptoms of depression and rate depression at 3/10 or lower prior to discharge. Denies SI  Attendees:  Patient:    Family:    Physician: Dr. Parke Poisson, MD  12/01/2014 1:45 PM  Nursing:  Lars Pinks, RN Case manager  12/01/2014 1:45 PM  Clinical Social Worker Peri Maris, Latanya Presser, MSW 12/01/2014 1:45 PM  Other: Lucinda Dell, Beverly Sessions Liasion 12/01/2014 1:45 PM  Clinical: Kerby Nora, RN 12/01/2014 1:45 PM  Other: , RN Charge Nurse 12/01/2014 1:45 PM  Other:     Peri Maris, [place] MSW

## 2014-12-02 NOTE — Progress Notes (Signed)
Patient ID: Morgan [name], female   DOB: 08/26/1975, 39 y.o.   MRN: [id number] [facility] MD Progress Note  12/02/2014 2:30 PM Kimerly Rowand  MRN:  [id number] Subjective:   Patient reports " I may be feeling a little better ".  She denies medication side effects.  Objective : I have discussed case with treatment team and have met with patient.  Patient has been less isolative , and has started to be more visible on unit and to participate in some groups. She continues to state she prefers to keep to self and has difficulty trusting strangers, but states " I am definitely more comfortable than I was when I came in "  Denies medication side effects . No disruptive or agitated behaviors on unit. Presents with improved mood, improved eye contact, and does not present hostile or irritable today. Denies any current SI. She is less ruminative today and  is more future oriented and has expressed a plan of going to live with a female relative in [place] " who is like a mother to me . It will be good for both of [place] ".     Principal Problem: MDD (major depressive disorder), recurrent episode, severe Diagnosis:   Patient Active Problem List   Diagnosis Date Noted  . MDD (major depressive disorder), recurrent episode, severe [F33.2] 11/30/2014  . Ureterolithiasis [N20.1] 04/19/2014  . Migraine [G43.909] 03/18/2013  . OSA on CPAP [G47.33] 03/18/2013   Total Time spent with patient: 20 minutes    Past Medical History:  Past Medical History  Diagnosis Date  . Allergy   . Sleep apnea   . Anxiety   . Depression   . Asthma   . Abortion 03/09/2013  . Pregnancy induced hypertension   . Kidney stone     Past Surgical History  Procedure Laterality Date  . Mouth surgery  1999  . Cervical cerclage      all 3 pregnancies  . Dilation and curettage of uterus  2001    retained placenta  . Dilation and curettage of uterus  2006    SAB  . Tubal ligation     Family History:  Family History  Problem  Relation Age of Onset  . HIV Mother   . Seizures Mother     secondary to drug abuse  . Drug abuse Mother   . Heart disease Maternal Grandfather   . Diabetes Paternal Grandmother   . Cancer Paternal Grandfather     Social History:  History  Alcohol Use  . Yes    Comment: less than once a week-holidays, birthday     History  Drug Use  . 1.00 per week  . Special: Marijuana    Social History   Social History  . Marital Status: Married    Spouse Name: Hollice Gong  . Number of Children: 1  . Years of Education: 12   Occupational History  . Designer, [employer]   Social History Main Topics  . Smoking status: Never Smoker   . Smokeless tobacco: Never Used  . Alcohol Use: Yes     Comment: less than once a week-holidays, birthday  . Drug Use: 1.00 per week    Special: Marijuana  . Sexual Activity:    Partners: Male    Patent examiner Protection: None   Other Topics Concern  . None   Social History Narrative   Lives with her husband and their daughter (born 2007).   Additional Social History:   Sleep: Good  Appetite:  Fair  Current Medications: Current Facility-Administered Medications  Medication Dose Route Frequency Provider Last Rate Last Dose  . alum & mag hydroxide-simeth (MAALOX/MYLANTA) 200-200-20 MG/5ML suspension 30 mL  30 mL Oral Q4H PRN Laverle Hobby, PA-C      . citalopram (CELEXA) tablet 20 mg  20 mg Oral Daily Jenne Campus, MD   20 mg at 12/02/14 0818  . feeding supplement (BOOST / RESOURCE BREEZE) liquid 1 Container  1 Container Oral BID BM Maricela Bo Ostheim, RD      . hydrOXYzine (ATARAX/VISTARIL) tablet 25 mg  25 mg Oral Q6H PRN Laverle Hobby, PA-C      . magnesium hydroxide (MILK OF MAGNESIA) suspension 30 mL  30 mL Oral Daily PRN Laverle Hobby, PA-C      . mirtazapine (REMERON) tablet 15 mg  15 mg Oral QHS Jenne Campus, MD   15 mg at 12/01/14 2209  . traZODone (DESYREL) tablet 25 mg  25 mg Oral QHS PRN Jenne Campus, MD   25 mg at  12/01/14 2210    Lab Results:  Results for orders placed or performed during the hospital encounter of 11/30/14 (from the past 48 hour(s))  TSH     Status: None   Collection Time: 12/01/14  6:31 AM  Result Value Ref Range   TSH 0.948 0.350 - 4.500 uIU/mL    Comment: Performed at [place] metabolic panel     Status: Abnormal   Collection Time: 12/01/14  6:31 AM  Result Value Ref Range   Sodium 139 135 - 145 mmol/L   Potassium 3.4 (L) 3.5 - 5.1 mmol/L   Chloride 105 101 - 111 mmol/L   CO2 28 22 - 32 mmol/L   Glucose, Bld 85 65 - 99 mg/dL   BUN 10 6 - 20 mg/dL   Creatinine, Ser 0.85 0.44 - 1.00 mg/dL   Calcium 9.4 8.9 - 10.3 mg/dL   GFR calc non Af Amer >60 >60 mL/min   GFR calc Af Amer >60 >60 mL/min    Comment: (NOTE) The eGFR has been calculated using the CKD EPI equation. This calculation has not been validated in all clinical situations. eGFR's persistently <60 mL/min signify possible Chronic Kidney Disease.    Anion gap 6 5 - 15    Comment: Performed at [facility]    Physical Findings: AIMS: Facial and Oral Movements Muscles of Facial Expression: None, normal Lips and Perioral Area: None, normal Jaw: None, normal Tongue: None, normal,Extremity Movements Upper (arms, wrists, hands, fingers): None, normal Lower (legs, knees, ankles, toes): None, normal, Trunk Movements Neck, shoulders, hips: None, normal, Overall Severity Severity of abnormal movements (highest score from questions above): None, normal Incapacitation due to abnormal movements: None, normal Patient's awareness of abnormal movements (rate only patient's report): No Awareness, Dental Status Current problems with teeth and/or dentures?: No Does patient usually wear dentures?: No  CIWA:  CIWA-Ar Total: 1 COWS:  COWS Total Score: 1  Musculoskeletal: Strength & Muscle Tone: within normal limits Gait & Station: normal Patient leans: N/A  Psychiatric  Specialty Exam: ROS- at this time does not endorse headache, does not endorse chest pain or shortness of breath, does not endorse vomiting   Blood pressure 135/95, pulse 72, temperature 98.1 F (36.7 C), temperature source Oral, resp. rate 16, height '5\' 6"'  (1.676 m), weight 165 lb (74.844 kg), last menstrual period 11/04/2014.Body mass index is 26.64 kg/(m^2).  General Appearance:  improved grooming  Eye Contact::  Good  Speech:  Normal Rate  Volume:  Normal  Mood:  Less depressed , less irritable   Affect:   Toy Cookey in range   Thought Process:  Linear  Orientation:  Full (Time, Place, and Person)  Thought Content:  denies hallucinations, no delusions, does not appear internally preoccupied   Suicidal Thoughts:  No- at this time denies any active SI , contracts for safety on unit, today describes her love for daughter as a protective factor from hurting self   Homicidal Thoughts:  No  Memory:  recent and remote grossly intact   Judgement:  Fair  Insight:  Fair  Psychomotor Activity:  Normal and Decreased  Concentration:  Good  Recall:  Good  Fund of Knowledge:Good  Language: Good  Akathisia:  Negative  Handed:  Right  AIMS (if indicated):     Assets:  Communication Skills Resilience  ADL's: fair   Cognition: WNL  Sleep:  Number of Hours: 6.5  Assessment - patient presents  Improved, less depressed, and in particular less irritable,  More interactive in milieu, more reactive in affect, and more future oriented. Denies medication side effects at present . She is starting to focus more on discharge options. Thus far tolerating Celexa/Remeron well .   Treatment Plan Summary: Daily contact with patient to assess and evaluate symptoms and progress in treatment, Medication management, Plan inpatient admission and medicaitons as below  Continue Trazodone  25 mgrs QHS for insomnia, in order to minimize risk of AM sedation. Continue Celexa to 20 mgrs QDAY to address depression and PTSD  symptoms Continue Remeron 15 mgrs QHS to address depression Continue Vistaril 25 mgrs Q 6 hours PRN for anxiety or agitation as needed  Encourage milieu , group participation to address coping skills, social skills, and help with symptom reduction. Treatment team working on disposition options. As noted, patient wants to move in with a supportive relative, in [place] area, after discharge.  COBOS, FERNANDO 12/02/2014, 2:30 PM

## 2014-12-02 NOTE — [facility] Group Notes (Signed)
[facility] LCSW Group Therapy 12/02/2014 1:15pm  Type of Therapy: Group Therapy- Feelings Around Relapse and Recovery  Participation Level: Active   Participation Quality:  Appropriate  Affect:  Animated  Cognitive: Alert and Oriented   Insight:  Developing   Engagement in Therapy: Developing/Improving and Engaged   Modes of Intervention: Clarification, Confrontation, Discussion, Education, Exploration, Limit-setting, Orientation, Problem-solving, Rapport Building, Dance movement psychotherapist, Socialization and Support  Summary of Progress/Problems: The topic for today was feelings about relapse. The group discussed what relapse prevention is to them and identified triggers that they are on the path to relapse. Members also processed their feeling towards relapse and were able to relate to common experiences. Group also discussed coping skills that can be used for relapse prevention.  Pt was actively involved in group discussion today, very animated in her responses. She validated, related, and affirmed other peers and expressed that she has difficulty drawing boundaries with others and extends herself to the point that she breaks down. Pt expresses that she is tired of "being there for everyone and no one being there" for her.    Therapeutic Modalities:   Cognitive Behavioral Therapy Solution-Focused Therapy Assertiveness Training Relapse Prevention Therapy    Lamar Sprinkles [phone] 12/02/2014 4:47 PM

## 2014-12-02 NOTE — [facility] Group Notes (Signed)
[facility] LCSW Aftercare Discharge Planning Group Note  12/02/2014 8:45 AM  Participation Quality: Alert, Appropriate and Oriented  Mood/Affect: Appropriate  Depression Rating: 3  Anxiety Rating: 3  Thoughts of Suicide: Pt denies SI/HI  Will you contract for safety? Yes  Current AVH: Pt denies  Plan for Discharge/Comments: Pt attended discharge planning group and actively participated in group. CSW discussed suicide prevention education with the group and encouraged them to discuss discharge planning and any relevant barriers. Pt expresses that her depression is manageable today. She describes a possible plan of going to live with her mother and stepfather while she works to get on her feet.  Transportation Means: Pt reports access to transportation  Supports: No supports mentioned at this time  Chad Cordial, LCSWA 12/02/2014 9:28 AM

## 2014-12-02 NOTE — Progress Notes (Signed)
Adult Psychoeducational Group Note  Date:  12/02/2014 Time:  9:32 PM  Group Topic/Focus:  Wrap-Up Group:   The focus of this group is to help patients review their daily goal of treatment and discuss progress on daily workbooks.  Participation Level:  Active  Participation Quality:  Appropriate and Attentive  Affect:  Appropriate  Cognitive:  Appropriate and Oriented  Insight: Appropriate and Good  Engagement in Group:  Engaged  Modes of Intervention:  Education  Additional Comments: Pt relapse prevention goal upon discharging is changing her flock. Pt tends to eliminate toxic people from her life and setting up a positive support system.   Morgan [name] 12/02/2014, 9:32 PM

## 2014-12-02 NOTE — Progress Notes (Signed)
Pt has been out in the dayroom interacting with peers this afternoon and has a brighter affect this afternoon compared to this morning. Pt is still forwarding little to staff. She rates depression as a 3 and anxiety as a 2 on 0-10 scale with 10 being the most.  A:Offered support, encouragement and 15 minute checks. R:Pt denies si and hi. Safety maintained on the unit.

## 2014-12-02 NOTE — Progress Notes (Signed)
D: Patient in the hallway on approach.  Patient states she had a good day.  Patient states she visited with her daughter today.  Patient states her depression is less.  Patient denies SI/HI and denies AVH.  Patient taking administered medications A: Staff to monitor Q 15 mins for safety.  Encouragement and support offered.  Scheduled medications administered per orders. R: Patient remains safe on the unit.  Patient attended group tonight. Patient visible on the unit and interacting with peers.  Patient taking administered medications.

## 2014-12-02 NOTE — [facility] Suicide Risk Assessment (Signed)
[facility] INPATIENT:  Family/Significant Other Suicide Prevention Education  Suicide Prevention Education:  Education Completed; Hassan Rowan, Pt's cousin [phone], has been identified by the patient as the family member/significant other with whom the patient will be residing, and identified as the person(s) who will aid the patient in the event of a mental health crisis (suicidal ideations/suicide attempt).  With written consent from the patient, the family member/significant other has been provided the following suicide prevention education, prior to the and/or following the discharge of the patient.  The suicide prevention education provided includes the following:  Suicide risk factors  Suicide prevention and interventions  National Suicide Hotline telephone number  [facility] assessment telephone number  [facility] Emergency Assistance 911  [facility] and/or Residential Mobile Crisis Unit telephone number  Request made of family/significant other to:  Remove weapons (e.g., guns, rifles, knives), all items previously/currently identified as safety concern.    Remove drugs/medications (over-the-counter, prescriptions, illicit drugs), all items previously/currently identified as a safety concern.  The family member/significant other verbalizes understanding of the suicide prevention education information provided.  The family member/significant other agrees to remove the items of safety concern listed above.  Elaina Hoops 12/02/2014, 3:39 PM

## 2014-12-02 NOTE — Plan of Care (Signed)
Problem: Diagnosis: Increased Risk For Suicide Attempt Goal: LTG-Patient Will Report Improved Mood and Deny Suicidal LTG (by discharge) Patient will report improved mood and deny suicidal ideation.  Outcome: Progressing Pt denies si.      

## 2014-12-03 NOTE — Plan of Care (Signed)
Problem: Alteration in mood & ability to function due to Goal: LTG-Pt reports reduction in suicidal thoughts (Patient reports reduction in suicidal thoughts and is able to verbalize a safety plan for whenever patient is feeling suicidal)  Outcome: Progressing Pt denies SI tonight.  She verbally contracts for safety.   Goal: STG-Patient will attend groups Outcome: Progressing Pt attended evening group on 12/03/14.

## 2014-12-03 NOTE — Progress Notes (Signed)
The focus of this group is to help patients review their daily goal of treatment and discuss progress on daily workbooks. Pt attended the evening group session and responded to all discussion prompts from the Writer. Pt shared that today was a good day on the unit, the highlight of which was learning she may be discharged tomorrow. "I really wanted to go today, but I can wait until tomorrow." Pt reported having no additional needs from Nursing Staff this evening. Morgan [name] was observed interacting with her peers both before and after group in the dayroom. Her affect was appropriate.

## 2014-12-03 NOTE — [facility] Group Notes (Signed)
[facility] Group Notes: (Clinical Social Work)   12/03/2014      Type of Therapy:  Group Therapy   Participation Level:  Did Not Attend despite MHT prompting   Ambrose Mantle, LCSW 12/03/2014, 5:11 PM

## 2014-12-03 NOTE — [facility] Group Notes (Addendum)
[facility] Group Notes:  (Nursing/MHT/Case Management/Adjunct)  Date:  12/03/2014  Time:  11:27 AM  Type of Therapy:  Psychoeducational Skills  Participation Level:  Active  Participation Quality:  Appropriate  Affect:  Appropriate  Cognitive:  Appropriate  Insight:  Appropriate  Engagement in Group:  Engaged  Modes of Intervention:  Discussion  Summary of Progress/Problems: Pt did attend healthy coping skills group.    Jacquelyne Balint Shanta 12/03/2014, 11:27 AM

## 2014-12-03 NOTE — Progress Notes (Signed)
Patient ID: Morgan [name], female   DOB: [birth year], 39 y.o.   MRN: [id number] [facility] MD Progress Note  12/03/2014 1:29 PM Morgan [name]  MRN:  [id number] Subjective:    She states she is doing " OK".  States her daughter came to visit yesterday, which helped her feel better, as well. Reports ongoing ruminations and focus on relationship issues, stating that she feels she needs to start focusing more on herself and her child. Denies medication side effects.  Objective : I have discussed case with treatment team and have met with patient.  More visible on unit- no disruptive or agitated behaviors .  More interactive in groups, and no longer presenting guarded or hostile as she had upon admission. Denies medication side effects . As noted, states that she feels she has been focusing too much on relationships/ others' needs, and that she is tired of this and wants to focus more on her own wellbeing and that of her  Daughter's. More responsive to empathy, support .     Principal Problem: MDD (major depressive disorder), recurrent episode, severe ([place]) Diagnosis:   Patient Active Problem List   Diagnosis Date Noted  . MDD (major depressive disorder), recurrent episode, severe [F33.2] 11/30/2014  . Ureterolithiasis [N20.1] 04/19/2014  . Migraine [G43.909] 03/18/2013  . OSA on CPAP [G47.33] 03/18/2013   Total Time spent with patient: 20 minutes    Past Medical History:  Past Medical History  Diagnosis Date  . Allergy   . Sleep apnea   . Anxiety   . Depression   . Asthma   . Abortion 03/09/2013  . Pregnancy induced hypertension   . Kidney stone     Past Surgical History  Procedure Laterality Date  . Mouth surgery  1999  . Cervical cerclage      all 3 pregnancies  . Dilation and curettage of uterus  2001    retained placenta  . Dilation and curettage of uterus  2006    SAB  . Tubal ligation     Family History:  Family History  Problem Relation Age of Onset  . HIV  Mother   . Seizures Mother     secondary to drug abuse  . Drug abuse Mother   . Heart disease Maternal Grandfather   . Diabetes Paternal Grandmother   . Cancer Paternal Grandfather     Social History:  History  Alcohol Use  . Yes    Comment: less than once a week-holidays, birthday     History  Drug Use  . 1.00 per week  . Special: Marijuana    Social History   Social History  . Marital Status: Married    Spouse Name: Hollice Gong  . Number of Children: 1  . Years of Education: 12   Occupational History  . Designer, [employer]   Social History Main Topics  . Smoking status: Never Smoker   . Smokeless tobacco: Never Used  . Alcohol Use: Yes     Comment: less than once a week-holidays, birthday  . Drug Use: 1.00 per week    Special: Marijuana  . Sexual Activity:    Partners: Male    Patent examiner Protection: None   Other Topics Concern  . None   Social History Narrative   Lives with her husband and their daughter (born 2007).   Additional Social History:   Sleep: Good  Appetite:  Good   Current Medications: Current Facility-Administered Medications  Medication Dose Route Frequency Provider Last Rate  Last Dose  . alum & mag hydroxide-simeth (MAALOX/MYLANTA) 200-200-20 MG/5ML suspension 30 mL  30 mL Oral Q4H PRN Laverle Hobby, PA-C      . citalopram (CELEXA) tablet 20 mg  20 mg Oral Daily Jenne Campus, MD   20 mg at 12/03/14 0815  . feeding supplement (BOOST / RESOURCE BREEZE) liquid 1 Container  1 Container Oral BID BM Maricela Bo Ostheim, RD      . hydrOXYzine (ATARAX/VISTARIL) tablet 25 mg  25 mg Oral Q6H PRN Laverle Hobby, PA-C      . magnesium hydroxide (MILK OF MAGNESIA) suspension 30 mL  30 mL Oral Daily PRN Laverle Hobby, PA-C      . mirtazapine (REMERON) tablet 15 mg  15 mg Oral QHS Jenne Campus, MD   15 mg at 12/02/14 2119  . traZODone (DESYREL) tablet 25 mg  25 mg Oral QHS PRN Jenne Campus, MD   25 mg at 12/02/14 2119    Lab  Results:  No results found for this or any previous visit (from the past 48 hour(s)).  Physical Findings: AIMS: Facial and Oral Movements Muscles of Facial Expression: None, normal Lips and Perioral Area: None, normal Jaw: None, normal Tongue: None, normal,Extremity Movements Upper (arms, wrists, hands, fingers): None, normal Lower (legs, knees, ankles, toes): None, normal, Trunk Movements Neck, shoulders, hips: None, normal, Overall Severity Severity of abnormal movements (highest score from questions above): None, normal Incapacitation due to abnormal movements: None, normal Patient's awareness of abnormal movements (rate only patient's report): No Awareness, Dental Status Current problems with teeth and/or dentures?: No Does patient usually wear dentures?: No  CIWA:  CIWA-Ar Total: 1 COWS:  COWS Total Score: 1  Musculoskeletal: Strength & Muscle Tone: within normal limits Gait & Station: normal Patient leans: N/A  Psychiatric Specialty Exam: ROS- at this time does not endorse headache, does not endorse chest pain or shortness of breath, does not endorse vomiting   Blood pressure 148/102, pulse 53, temperature 98 F (36.7 C), temperature source Oral, resp. rate 16, height 5' 6" (1.676 m), weight 165 lb (74.844 kg), last menstrual period 11/04/2014.Body mass index is 26.64 kg/(m^2).  General Appearance: improved grooming  Eye Contact::  Good  Speech:  Normal Rate  Volume:  Normal  Mood:   Improving   Affect:    More reactive, no longer irritable , reactive   Thought Process:  Linear  Orientation:  Full (Time, Place, and Person)  Thought Content:  denies hallucinations, no delusions, does not appear internally preoccupied   Suicidal Thoughts:  No- at this time denies any active SI , contracts for safety on unit, today describes her love for daughter as a protective factor from hurting self   Homicidal Thoughts:  No  Memory:  recent and remote grossly intact   Judgement:   Other:  improving  Insight:  improving   Psychomotor Activity:  Normal  Concentration:  Good  Recall:  Good  Fund of Knowledge:Good  Language: Good  Akathisia:  Negative  Handed:  Right  AIMS (if indicated):     Assets:  Communication Skills Resilience  ADL's: fair   Cognition: WNL  Sleep:  Number of Hours: 6.75  Assessment - patient  Continues to present with improving mood and decreased dysphoria and irritability. Seems more comfortable on unit, less guarded, with a more relaxed demeanor and more group participation. Denies medication side effects at present . She is gaining insight of interpersonal relationship tendencies to focus  more on others' needs than her own, and is planning on working on this.  She is tolerating Celexa/Remeron well , without side effects..   Treatment Plan Summary: Daily contact with patient to assess and evaluate symptoms and progress in treatment, Medication management, Plan inpatient admission and medicaitons as below  Continue Trazodone  25 mgrs QHS for insomnia, in order to minimize risk of AM sedation. Continue Celexa to 20 mgrs QDAY to address depression and PTSD symptoms Continue Remeron 15 mgrs QHS to address depression Continue Vistaril 25 mgrs Q 6 hours PRN for anxiety or agitation as needed  Encourage milieu , group participation to address coping skills, social skills, and help with symptom reduction. Treatment team working on disposition options. As noted, patient wants to move in with a supportive relative, in [place] area, after discharge.  COBOS, FERNANDO 12/03/2014, 1:29 PM

## 2014-12-03 NOTE — [facility] Group Notes (Signed)
[facility] Group Notes:  (Nursing/MHT/Case Management/Adjunct)  Date:  12/03/2014  Time:  9:25 AM  Type of Therapy:  Psychoeducational Skills  Participation Level:  Did Not Attend  Participation Quality:  Did Not Attend  Affect:  Did Not Attend  Cognitive:  Did Not Attend  Insight:  None  Engagement in Group:  Did Not Attend  Modes of Intervention:  Did Not Attend  Summary of Progress/Problems: Pt did not attend patient self inventory group.  Anthony Tamburo Shanta 12/03/2014, 9:25 AM 

## 2014-12-03 NOTE — Progress Notes (Signed)
D: Pt has blunted affect and depressed mood.  She reports "I had a goal but that didn't go well."  Pt chose not to disclose what her goal was.  Pt denies SI/HI, denies hallucinations, denies pain.  Pt has been visible in milieu interacting with peers and staff appropriately.  Pt attended evening group.   A: Introduced self to pt.  Met with pt 1:1 and provided support and encouragement.  Actively listened to pt.  Medications administered per order.  PRN medication administered for sleep. R: Pt is compliant with medications.  Pt verbally contracts for safety.  Will continue to monitor and assess.

## 2014-12-03 NOTE — Progress Notes (Signed)
D Lundon is already talking about going home...minimizing her prob. She was agitated and irritable this morning , warming up after [employer] group.   A She contracts for safety and denies SI today. She rates her depression,, hopelessness and anxiety " 2/0/4", respectively.   POC cont

## 2014-12-04 DIAGNOSIS — F332 Major depressive disorder, recurrent severe without psychotic features: Secondary | ICD-10-CM | POA: Insufficient documentation

## 2014-12-04 MED ORDER — MIRTAZAPINE 15 MG PO TABS: 15.0000 mg | ORAL_TABLET | Freq: Every day | ORAL | Status: DC

## 2014-12-04 MED ORDER — HYDROXYZINE HCL 25 MG PO TABS: 25.0000 mg | ORAL_TABLET | Freq: Four times a day (QID) | ORAL | Status: DC | PRN

## 2014-12-04 MED ORDER — TRAZODONE HCL 50 MG PO TABS: 25.0000 mg | ORAL_TABLET | Freq: Every evening | ORAL | Status: DC | PRN

## 2014-12-04 MED ORDER — CITALOPRAM HYDROBROMIDE 20 MG PO TABS: 20.0000 mg | ORAL_TABLET | Freq: Every day | ORAL | Status: DC

## 2014-12-04 NOTE — Progress Notes (Signed)
  [facility] Adult Case Management Discharge Plan :  Will you be returning to the same living situation after discharge:  Yes,  mother At discharge, do you have transportation home?: Yes,  family Do you have the ability to pay for your medications: Yes,  insurance  Release of information consent forms completed and in the chart;  Patient's signature needed at discharge.  Patient to Follow up at: Follow-up Information    Follow up with [facility] On 12/27/2014.   Why:  at 1:00pm for medication management with Dr. Jannifer Franklin.   Contact information:   [address] [place], [place] [address] Phone: [phone]      Follow up with [facility] On 12/06/2014.   Why:  at 3:00pm for your initial therapy appointment. Please bring your insurance card and arrive 20 minutes early to fill out paperwork   Contact information:   [address], [place], [place], [address] [phone]  Office [phone]  Fax       Patient denies SI/HI: Yes,  see doctor d/c note    Safety Planning and Suicide Prevention discussed: Yes,  with pt and cousin  Have you used any form of tobacco in the last 30 days? (Cigarettes, Smokeless Tobacco, Cigars, and/or Pipes): Yes  Has patient been referred to the Quitline?: Patient refused referral  Sarina Ser 12/04/2014, 1:15 PM

## 2014-12-04 NOTE — [facility] Group Notes (Signed)
[facility] Group Notes:  (Nursing/MHT/Case Management/Adjunct)  Date:  12/04/2014  Time:  10:49 AM  Type of Therapy:  Nurse Education  Participation Level:  Did Not Attend  Participation Quality:  N/A  Affect:  N/A  Cognitive:  N/A  Insight:  None  Engagement in Group:  None  Modes of Intervention:  Discussion and Education  Summary of Progress/Problems: Patient did not attend group.   Marzetta Board E 12/04/2014, 10:49 AM

## 2014-12-04 NOTE — [facility] Suicide Risk Assessment (Signed)
[facility] Discharge Suicide Risk Assessment   Demographic Factors:  39 year old married female , employed , plans to go live with a supportive relative   Total Time spent with patient: 30 minutes  Musculoskeletal: Strength & Muscle Tone: within normal limits Gait & Station: normal Patient leans: N/A  Psychiatric Specialty Exam: Physical Exam  ROS  Blood pressure 140/83, pulse 65, temperature 98.2 F (36.8 C), temperature source Oral, resp. rate 16, height  (1.676 m), weight 165 lb (74.844 kg), last menstrual period 11/04/2014.Body mass index is 26.64 kg/(m^2).  General Appearance: Well Groomed  Eye Contact::  Good  Speech:  Normal Rate409  Volume:  Normal  Mood:  improved, denies depression at this time  Affect:  Appropriate  Thought Process:  Linear  Orientation:  Full (Time, Place, and Person)  Thought Content:  denies hallucinations, no delusions  Suicidal Thoughts:  No  Homicidal Thoughts:  No  Memory:  recent and remote grossly intact   Judgement:  Other:  improved  Insight:  improved   Psychomotor Activity:  Normal  Concentration:  Good  Recall:  Good  Fund of Knowledge:Good  Language: Good  Akathisia:  Negative  Handed:  Right  AIMS (if indicated):     Assets:  Communication Skills Desire for Improvement Physical Health Resilience  Sleep:  Number of Hours: 6.5  Cognition: WNL  ADL's:  Intact   Have you used any form of tobacco in the last 30 days? (Cigarettes, Smokeless Tobacco, Cigars, and/or Pipes): Yes  Has this patient used any form of tobacco in the last 30 days? (Cigarettes, Smokeless Tobacco, Cigars, and/or Pipes)  Patient states she does not smoke   Mental Status Per Nursing Assessment::   On Admission:  Suicidal ideation indicated by patient, Self-harm thoughts  Current Mental Status by Physician: At this time patient improved compared to admission- mood improved, affect fuller in range, at this time denies depression, no thought disorder, no SI,  no HI, no psychotic symptoms, future oriented   Loss Factors: Felt overwhelmed by family stressors , housing difficulties   Historical Factors: History of depression, prior history of suicide attempt   Risk Reduction Factors:   Sense of responsibility to family, Employed, Positive social support and Positive coping skills or problem solving skills  Continued Clinical Symptoms:  As noted, at this time patient improved - mood much improved, affect more reactive, no longer irritable , no thought disorder, no SI or HI, no psychotic symptoms  Cognitive Features That Contribute To Risk:  No gross cognitive deficits noted upon discharge. Is alert , attentive, and oriented x 3   Suicide Risk:  Mild:  Suicidal ideation of limited frequency, intensity, duration, and specificity.  There are no identifiable plans, no associated intent, mild dysphoria and related symptoms, good self-control (both objective and subjective assessment), few other risk factors, and identifiable protective factors, including available and accessible social support.  Principal Problem: MDD (major depressive disorder), recurrent episode, severe [facility]) Discharge Diagnoses:  Patient Active Problem List   Diagnosis Date Noted  . MDD (major depressive disorder), recurrent episode, severe (HCC) [F33.2] 11/30/2014  . Ureterolithiasis [N20.1] 04/19/2014  . Migraine [G43.909] 03/18/2013  . OSA on CPAP [G47.33] 03/18/2013    Follow-up Information    Follow up with [facility] On 12/27/2014.   Why:  at 1:00pm for medication management with Dr. Jannifer Franklin.   Contact information:   [address] [place], [place] [address] Phone: [phone]  Follow up with [facility] On 12/06/2014.   Why:  at 3:00pm for your initial therapy appointment. Please bring your insurance card and arrive 20 minutes early to fill out paperwork   Contact information:   [address], [place], [place], [address] [phone]  Office [phone]  Fax       Plan Of Care/Follow-up recommendations:  Activity:  as tolerated  Diet:  Regular Tests:  NA Other:  see below  Is patient on multiple antipsychotic therapies at discharge:  No   Has Patient had three or more failed trials of antipsychotic monotherapy by history:  No  Recommended Plan for Multiple Antipsychotic Therapies: NA  Patient is leaving in good spirits . Plans to go live with relative. Follow up as above . Patient's BP has tended to be elevated at times- she states she takes her BP often at home and it is normal- she suspects elevated BP on unit is related to diet change while in hospital. She will continue to  Periodically monitor it and seek treatment if elevated . She has an established PCP- Dr. Katrinka Blazing in [place].   COBOS, FERNANDO 12/04/2014, 10:05 AM
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
## 2014-12-04 NOTE — Progress Notes (Signed)
Patient ID: Morgan [name], female   DOB: 1975/12/07, 38 y.o.   MRN: [id number]  Pt. Denies SI/HI and A/V hallucinations. Belongings returned to patient at time of discharge. Patient denies any pain or discomfort. Discharge instructions and medications were reviewed with patient. Patient verbalized understanding of both medications and discharge instructions. Patient discharged to lobby where a friend picked patient up. Q15 minute safety checks maintained until discharge. No distress upon discharge.

## 2014-12-14 ENCOUNTER — Encounter: Payer: Self-pay | Admitting: Family Medicine

## 2015-01-03 ENCOUNTER — Emergency Department (HOSPITAL_COMMUNITY)
Admission: EM | Admit: 2015-01-03 | Discharge: 2015-01-03 | Disposition: A | Discharge status: Home / Self Care | Payer: BLUE CROSS/BLUE SHIELD | Attending: Emergency Medicine | Admitting: Emergency Medicine

## 2015-01-03 ENCOUNTER — Encounter (HOSPITAL_COMMUNITY): Payer: Self-pay | Admitting: Emergency Medicine

## 2015-01-03 DIAGNOSIS — F419 Anxiety disorder, unspecified: Secondary | ICD-10-CM | POA: Insufficient documentation

## 2015-01-03 DIAGNOSIS — G43909 Migraine, unspecified, not intractable, without status migrainosus: Secondary | ICD-10-CM | POA: Diagnosis not present

## 2015-01-03 DIAGNOSIS — F329 Major depressive disorder, single episode, unspecified: Secondary | ICD-10-CM | POA: Diagnosis not present

## 2015-01-03 DIAGNOSIS — Z87442 Personal history of urinary calculi: Secondary | ICD-10-CM | POA: Insufficient documentation

## 2015-01-03 DIAGNOSIS — Z8669 Personal history of other diseases of the nervous system and sense organs: Secondary | ICD-10-CM | POA: Diagnosis not present

## 2015-01-03 DIAGNOSIS — J45909 Unspecified asthma, uncomplicated: Secondary | ICD-10-CM | POA: Insufficient documentation

## 2015-01-03 DIAGNOSIS — Z79899 Other long term (current) drug therapy: Secondary | ICD-10-CM | POA: Diagnosis not present

## 2015-01-03 DIAGNOSIS — H53149 Visual discomfort, unspecified: Secondary | ICD-10-CM | POA: Diagnosis present

## 2015-01-03 DIAGNOSIS — G43009 Migraine without aura, not intractable, without status migrainosus: Secondary | ICD-10-CM

## 2015-01-03 MED ORDER — DIPHENHYDRAMINE HCL 50 MG/ML IJ SOLN
50.0000 mg | Freq: Once | INTRAMUSCULAR | Status: AC
  Administered 2015-01-03: 50 mg via INTRAVENOUS
  Filled 2015-01-03: qty 1

## 2015-01-03 MED ORDER — KETOROLAC TROMETHAMINE 30 MG/ML IJ SOLN
30.0000 mg | Freq: Once | INTRAMUSCULAR | Status: AC
  Administered 2015-01-03: 30 mg via INTRAVENOUS
  Filled 2015-01-03: qty 1

## 2015-01-03 MED ORDER — METOCLOPRAMIDE HCL 5 MG/ML IJ SOLN
10.0000 mg | Freq: Once | INTRAMUSCULAR | Status: AC
  Administered 2015-01-03: 10 mg via INTRAVENOUS
  Filled 2015-01-03: qty 2

## 2015-01-03 MED ORDER — SODIUM CHLORIDE 0.9 % IV BOLUS (SEPSIS)
1000.0000 mL | Freq: Once | INTRAVENOUS | Status: AC
Start: 2015-01-03 — End: 2015-01-03
  Administered 2015-01-03: 1000 mL via INTRAVENOUS

## 2015-01-03 NOTE — ED Notes (Signed)
Pt verbalized understanding of d/c instructions and has no further questions. Pt stable and NAD.  

## 2015-01-03 NOTE — ED Provider Notes (Signed)
TIME SEEN: 1:48 AM  CHIEF COMPLAINT: Migraine   HPI: HPI Comments: Morgan [name] is a 39 y.o. female, with a PMhx of migraines, anxiety and depression, and asthma, who presents to the Emergency Department complaining of a constant, severe headache that has been gradually worsening over the past 3 days. Pt describes her current headache to be typical of her migraines and reports associated photophobia, nausea and vomiting and worsening of her HA with movement and light. She takes ibuprofen and naproxen for her history of migraines but her current headache was unrelieved by these medications. She has been evaluated in the ED previously for migraines with improvement of her symptoms after a migraine cocktail. Denies recent head injury, numbness, tingling or weakness.   ROS: See HPI Constitutional: no fever  Eyes: no drainage  ENT: no runny nose   Cardiovascular:  no chest pain  Resp: no SOB  GI: nausea, vomiting GU: no dysuria Integumentary: no rash  Allergy: no hives  Musculoskeletal: no leg swelling  Neurological: no slurred speech ROS otherwise negative  PAST MEDICAL HISTORY/PAST SURGICAL HISTORY:  Past Medical History  Diagnosis Date  . Allergy   . Sleep apnea   . Anxiety   . Depression   . Asthma   . Abortion 03/09/2013  . Pregnancy induced hypertension   . Kidney stone     MEDICATIONS:  Prior to Admission medications   Medication Sig Start Date End Date Taking? Authorizing Provider  citalopram (CELEXA) 20 MG tablet Take 1 tablet (20 mg total) by mouth daily. 12/04/14  Yes Thermon LeylandLaura A Davis, NP  naproxen sodium (ANAPROX) 220 MG tablet Take 440 mg by mouth 2 (two) times daily as needed (pain).   Yes Historical Provider, MD  propranolol (INDERAL) 20 MG tablet Take 20 mg by mouth 2 (two) times daily.   Yes Historical Provider, MD  traZODone (DESYREL) 50 MG tablet Take 0.5 tablets (25 mg total) by mouth at bedtime as needed for sleep. 12/04/14  Yes Thermon LeylandLaura A Davis, NP     ALLERGIES:  Allergies  Allergen Reactions  . Stadol [Butorphanol Tartrate] Itching  . Kale Itching and Rash    SOCIAL HISTORY:  Social History  Substance Use Topics  . Smoking status: Never Smoker   . Smokeless tobacco: Never Used  . Alcohol Use: Yes     Comment: less than once a week-holidays, birthday    FAMILY HISTORY: Family History  Problem Relation Age of Onset  . HIV Mother   . Seizures Mother     secondary to drug abuse  . Drug abuse Mother   . Heart disease Maternal Grandfather   . Diabetes Paternal Grandmother   . Cancer Paternal Grandfather     EXAM: BP 136/94 mmHg  Pulse 51  Temp(Src) 97.5 F (36.4 C) (Oral)  Resp 16  Ht 5\' 5"  (1.651 m)  Wt 166 lb (75.297 kg)  BMI 27.62 kg/m2  SpO2 100%  LMP 12/30/2014 CONSTITUTIONAL: Alert and oriented and responds appropriately to questions. Well-appearing; well-nourished HEAD: Normocephalic EYES: Conjunctivae clear, PERRL, photophobia.  ENT: normal nose; no rhinorrhea; moist mucous membranes; pharynx without lesions noted NECK: Supple, no meningismus, no LAD  CARD: RRR; S1 and S2 appreciated; no murmurs, no clicks, no rubs, no gallops RESP: Normal chest excursion without splinting or tachypnea; breath sounds clear and equal bilaterally; no wheezes, no rhonchi, no rales, no hypoxia or respiratory distress, speaking full sentences ABD/GI: Normal bowel sounds; non-distended; soft, non-tender, no rebound, no guarding, no peritoneal signs BACK:  The back appears normal and is non-tender to palpation, there is no CVA tenderness EXT: Normal ROM in all joints; non-tender to palpation; no edema; normal capillary refill; no cyanosis, no calf tenderness or swelling    SKIN: Normal color for age and race; warm NEURO: Moves all extremities equally, sensation to light touch intact diffusely, cranial nerves II through XII intact, normal gait.  PSYCH: The patient's mood and manner are appropriate. Grooming and personal hygiene  are appropriate.   MEDICAL DECISION MAKING: Patient here with typical migraine headache. Neurologically intact. Afebrile without meningismus. Given Toradol, Reglan, Benadryl and IV fluids and now headache is completely gone. Doubt subarachnoid hemorrhage, meningitis, mass, stroke.  I do not feel there is any life-threatening condition present. Discussed all results, exam findings with patient. I feel the patient is safe to be discharged home without further emergent workup. Discussed usual and customary return precautions. Patient and family (if present) verbalize understanding and are comfortable with this plan.  Patient will follow-up with their primary care provider. If they do not have a primary care provider, information for follow-up has been provided to them. All questions have been answered.   I personally performed the services described in this documentation, which was scribed in my presence. The recorded information has been reviewed and is accurate.   Layla Maw Tyren Dugar, DO 01/03/15 [phone]

## 2015-01-03 NOTE — ED Notes (Signed)
Pt reports migraine since Friday. Pt just started taking propanolol today prescribed by psychiatrist. Hx of migraines. C/o  Light sensitivity, nv.

## 2015-01-03 NOTE — Discharge Instructions (Signed)

## 2015-01-19 ENCOUNTER — Ambulatory Visit (INDEPENDENT_AMBULATORY_CARE_PROVIDER_SITE_OTHER): Payer: BLUE CROSS/BLUE SHIELD | Admitting: Family Medicine

## 2015-01-19 VITALS — BP 118/72 | HR 63 | Temp 98.7°F | Resp 18 | Ht 65.0 in | Wt 176.0 lb

## 2015-01-19 DIAGNOSIS — N898 Other specified noninflammatory disorders of vagina: Secondary | ICD-10-CM

## 2015-01-19 DIAGNOSIS — R42 Dizziness and giddiness: Secondary | ICD-10-CM | POA: Diagnosis not present

## 2015-01-19 DIAGNOSIS — R350 Frequency of micturition: Secondary | ICD-10-CM | POA: Diagnosis not present

## 2015-01-19 DIAGNOSIS — R8290 Unspecified abnormal findings in urine: Secondary | ICD-10-CM | POA: Diagnosis not present

## 2015-01-19 DIAGNOSIS — R030 Elevated blood-pressure reading, without diagnosis of hypertension: Secondary | ICD-10-CM | POA: Diagnosis not present

## 2015-01-19 DIAGNOSIS — R300 Dysuria: Secondary | ICD-10-CM | POA: Diagnosis not present

## 2015-01-19 DIAGNOSIS — N3090 Cystitis, unspecified without hematuria: Secondary | ICD-10-CM

## 2015-01-19 LAB — POCT URINALYSIS DIP (MANUAL ENTRY)
Bilirubin, UA: NEGATIVE
Blood, UA: NEGATIVE
GLUCOSE UA: NEGATIVE
Ketones, POC UA: NEGATIVE
Leukocytes, UA: NEGATIVE
NITRITE UA: POSITIVE — AB
Protein Ur, POC: NEGATIVE
Spec Grav, UA: 1.02
UROBILINOGEN UA: 0.2
pH, UA: 7

## 2015-01-19 LAB — POCT WET + KOH PREP
Trich by wet prep: ABSENT
Yeast by KOH: ABSENT
Yeast by wet prep: ABSENT

## 2015-01-19 LAB — POC MICROSCOPIC URINALYSIS (UMFC): MUCUS RE: ABSENT

## 2015-01-19 MED ORDER — FLUCONAZOLE 150 MG PO TABS: 150.0000 mg | ORAL_TABLET | Freq: Once | ORAL | Status: DC

## 2015-01-19 MED ORDER — NITROFURANTOIN MONOHYD MACRO 100 MG PO CAPS: 100.0000 mg | ORAL_CAPSULE | Freq: Two times a day (BID) | ORAL | Status: DC

## 2015-01-19 NOTE — Progress Notes (Addendum)
Subjective:  This chart was scribed for Meredith Staggers, MD by Broadus John, Medical Scribe. This patient was seen in Room 10 and the patient's care was started at 1:42 PM.   Patient ID: Morgan [name], female    DOB: 10/18/1975, 39 y.o.   MRN: [id number]  Chief Complaint  Patient presents with  . Urinary Tract Infection    this has been going on since last visit   . Urinary Frequency    with odor   . Dysuria    burning   . Dizziness    off and on past 2 weeks unable to work yesterday   . Hypertension    has beening running  140's/90-100  . Flu Vaccine    HPI HPI Comments: Morgan [name] is a 39 y.o. female who presents to [facility] for multiple concerns:  Possible UTI: Pt was seen here on 09/20 for low back pain symptoms. Prior to that on 09/16 for multiple concerns including vaginal discharge. She was diagnosed with [place] Vaginosis, treated with diflucan 150 mg X1.  Today, pt notes symptoms of tenderness of the suprapubic area, intermittent urinary frequency and dysuria, and a constant abnormal urinary odor. She does have a frequent history of UTIs. Pt denies vaginal discharge after taking 2X diflucan for the last diagnosis, or recent STIs. Pt took both of the diflucan. Pt reports that she is in a monogamous relationship, same partner during the past year. She indicates that she is very prone to yeast infections.   Abdominal pain:  Pt notes having abdominal pain. She denies nausea or vomiting.   Dizziness: Pt notes that the symptoms are off and on for past two weeks. Pt did not go to work yesterday. There are multiple recent notes in [facility]. She was seen for migraine 16 days ago in the ER. Most recent CT-head 09/17, no acute intracranial pathology. Pt also had a normal EKG on 09/28. Pt had a normal TSH 09/29, and normal CBC 09/27. Today, pt notes that taking propranolol has given her relief for her headaches/tremors/ and anxiety symptoms, and since  taking it she rarely experiences dizziness. However she indicates that she had an episode yesterday of palpitations, light headedness, dizziness, and a "Euphoric feeling similar to using drugs". She states that the episode lasted for most of the day yesterday, and indicates that she was not able to go to work as a result. She currently is not experiencing these symptoms. Pt denies blurry or double vision, focal weakness, or slurred speech during onset of the symptoms. Pt sates that the symptoms were not similar to a panic attack. Pt states that she has been having visual disturbances such as "seeing stars", increased sleeping/somnolence, and feeling dehydrated at times.   Mental Health: Pt was admitted from 09/28-10/2 to behavior health for suicide attempt via overdose. Pt apparently had taken muscle relaxants, but Acetaminophen levels were mildly elevated at 50. UDS was positive for THC. EtOH were normal. Salicylate level was normal. Pt states that she has her support system, and she is compliant with taking her medications. Pt does not drink alcohol, however she still does marijuana, no other drugs. Pt indicates that her relationship with her boyfriend is going well.   Elevated BP: She reports home readings of 140's/90-100. She was 118/72 here today. Blood pressure in the ER was 119/75 and 136/94. Also normal here in September, 108/70. Pt indicates that she was ranging at145/101 yesterday at her psychiatrist office.   Patient  Active Problem List   Diagnosis Date Noted  . Major depressive disorder, recurrent, severe without psychotic features (HCC)   . MDD (major depressive disorder), recurrent episode, severe (HCC) 11/30/2014  . Ureterolithiasis 04/19/2014  . Migraine 03/18/2013  . OSA on CPAP 03/18/2013   Past Medical History  Diagnosis Date  . Allergy   . Sleep apnea   . Anxiety   . Depression   . Asthma   . Abortion 03/09/2013  . Pregnancy induced hypertension   . Kidney stone     Past Surgical History  Procedure Laterality Date  . Mouth surgery  1999  . Cervical cerclage      all 3 pregnancies  . Dilation and curettage of uterus  2001    retained placenta  . Dilation and curettage of uterus  2006    SAB  . Tubal ligation     Allergies  Allergen Reactions  . Stadol [Butorphanol Tartrate] Itching  . Kale Itching and Rash   Prior to Admission medications   Medication Sig Start Date End Date Taking? Authorizing Provider  citalopram (CELEXA) 20 MG tablet Take 1 tablet (20 mg total) by mouth daily. 12/04/14  Yes Thermon LeylandLaura A Davis, NP  propranolol (INDERAL) 20 MG tablet Take 20 mg by mouth 2 (two) times daily.   Yes Historical Provider, MD  traZODone (DESYREL) 50 MG tablet Take 0.5 tablets (25 mg total) by mouth at bedtime as needed for sleep. 12/04/14  Yes Thermon LeylandLaura A Davis, NP  naproxen sodium (ANAPROX) 220 MG tablet Take 440 mg by mouth 2 (two) times daily as needed (pain).    Historical Provider, MD   Social History   Social History  . Marital Status: Married    Spouse Name: Milus BanisterRyan Cole  . Number of Children: 1  . Years of Education: 12   Occupational History  . Academic librarianpackage handler [employer]   Social History Main Topics  . Smoking status: Never Smoker   . Smokeless tobacco: Never Used  . Alcohol Use: Yes     Comment: less than once a week-holidays, birthday  . Drug Use: 1.00 per week    Special: Marijuana  . Sexual Activity:    Partners: Male    Pharmacist, hospitalBirth Control/ Protection: None   Other Topics Concern  . Not on file   Social History Narrative   Lives with her husband and their daughter (born 2007).    Review of Systems  Constitutional: Positive for activity change.  Eyes: Positive for visual disturbance.  Cardiovascular: Positive for palpitations.  Gastrointestinal: Positive for abdominal pain. Negative for nausea and vomiting.  Genitourinary: Positive for dysuria and frequency. Negative for vaginal discharge.  Neurological: Positive for dizziness and  light-headedness. Negative for speech difficulty and weakness.       Objective:   Physical Exam  Constitutional: She is oriented to person, place, and time. She appears well-developed and well-nourished.  HENT:  Head: Normocephalic and atraumatic.  Mouth/Throat: Oropharynx is clear and moist.  Moist oral mucosa   Eyes: Conjunctivae and EOM are normal. Pupils are equal, round, and reactive to light. Right eye exhibits no nystagmus. Left eye exhibits no nystagmus.  Neck: Carotid bruit is not present. No thyromegaly present.  No nodules palpated in the neck.  Cardiovascular: Normal rate, regular rhythm, normal heart sounds and intact distal pulses.   Pulmonary/Chest: Effort normal and breath sounds normal.  Abdominal: Soft. She exhibits no pulsatile midline mass. There is tenderness.  Minimal tenderness RUQ   Musculoskeletal: She exhibits no  edema or tenderness.  No lower extremity edema.  No calf swelling or tenderness.  Lymphadenopathy:  No apparent lymphadenopathy    Neurological: She is alert and oriented to person, place, and time.  Skin: Skin is warm and dry.  Psychiatric: She has a normal mood and affect. Her behavior is normal.  Vitals reviewed.   Filed Vitals:   01/19/15 1257  BP: 118/72  Pulse: 63  Temp: 98.7 F (37.1 C)  TempSrc: Oral  Resp: 18  Height: 5\' 5"  (1.651 m)  Weight: 176 lb (79.833 kg)  SpO2: 97%   Results for orders placed or performed in visit on 01/19/15  Urine culture  Result Value Ref Range   Culture ESCHERICHIA COLI    Colony Count >=100,000 COLONIES/ML    Organism ID, Bacteria ESCHERICHIA COLI       Susceptibility   Escherichia coli -  (no method available)    AMPICILLIN >=32 Resistant     AMOX/CLAVULANIC 4 Sensitive     AMPICILLIN/SULBACTAM 8 Sensitive     PIP/TAZO <=4 Sensitive     IMIPENEM <=0.25 Sensitive     CEFAZOLIN <=4 Not Reportable     CEFTRIAXONE <=1 Sensitive     CEFTAZIDIME <=1 Sensitive     CEFEPIME <=1 Sensitive      GENTAMICIN <=1 Sensitive     TOBRAMYCIN <=1 Sensitive     CIPROFLOXACIN <=0.25 Sensitive     LEVOFLOXACIN <=0.12 Sensitive     NITROFURANTOIN <=16 Sensitive     TRIMETH/SULFA* >=320 Resistant      * NR=NOT REPORTABLE,SEE COMMENTORAL therapy:A cefazolin MIC of <32 predicts susceptibility to the oral agents cefaclor,cefdinir,cefpodoxime,cefprozil,cefuroxime,cephalexin,and loracarbef when used for therapy of uncomplicated UTIs due to E.coli,K.pneumomiae,and P.mirabilis. PARENTERAL therapy: A cefazolinMIC of >8 indicates resistance to parenteralcefazolin. An alternate test method must beperformed to confirm susceptibility to parenteralcefazolin.  POCT urinalysis dipstick  Result Value Ref Range   Color, UA yellow yellow   Clarity, UA cloudy (A) clear   Glucose, UA negative negative   Bilirubin, UA negative negative   Ketones, POC UA negative negative   Spec Grav, UA 1.020    Blood, UA negative negative   pH, UA 7.0    Protein Ur, POC negative negative   Urobilinogen, UA 0.2    Nitrite, UA Positive (A) Negative   Leukocytes, UA Negative Negative  POCT Microscopic Urinalysis ([facility])  Result Value Ref Range   WBC,UR,HPF,POC Few (A) None WBC/hpf   RBC,UR,HPF,POC None None RBC/hpf   Bacteria Many (A) None, Too numerous to count   Mucus Absent Absent   Epithelial Cells, UR Per Microscopy Few (A) None, Too numerous to count cells/hpf  POCT Wet + KOH Prep  Result Value Ref Range   Yeast by KOH Absent Present, Absent   Yeast by wet prep Absent Present, Absent   WBC by wet prep Few None, Few, Too numerous to count   Clue Cells Wet Prep HPF POC Moderate (A) None, Too numerous to count   Trich by wet prep Absent Present, Absent   Bacteria Wet Prep HPF POC Many (A) None, Few, Too numerous to count   Epithelial Cells By [employer] ([facility]) Many (A) None, Few, Too numerous to count   RBC,UR,HPF,POC Few (A) None RBC/hpf       Assessment & Plan:    Cena Bruhn is a 39 y.o. female Frequent  urination, Abnormal urine odor, Burning with urination - Plan: POCT urinalysis dipstick, POCT Microscopic Urinalysis ([facility]), Cystitis - Plan: Urine culture, nitrofurantoin, macrocrystal-monohydrate, (  MACROBID) 100 MG capsule  -UTI. Start Macrobid 100 mg twice a day. Increase fluids, RTC precautions.  -Diflucan prescription if Candida symptoms after antibiotic.  Blood pressure elevated without history of HTN - Plan: EKG 12-Lead  -Numbers in office looked okay. Check outside blood pressures, and if remaining over 140/90, return to discuss further.  Dizziness - Plan: POCT CBC, POCT glucose (manual entry), TSH  -Reassuring nonfocal neuro exam. Vitals stable in office, no concerning findings on EKG. Recommended blood work including CBC, glucose, and TSH, but she left prior to having blood were performed. We did try to call her to return to office to have this performed, but has not returned at this time.  -Advised to increase fluids, RTC precautions if symptoms return.   Vaginal discharge - Plan: POCT Wet + KOH Prep  -Physiologic, as wet prep reassuring. RTC if symptoms worsen.  Meds ordered this encounter  Medications  . nitrofurantoin, macrocrystal-monohydrate, (MACROBID) 100 MG capsule    Sig: Take 1 capsule (100 mg total) by mouth 2 (two) times daily.    Dispense:  14 capsule    Refill:  0  . fluconazole (DIFLUCAN) 150 MG tablet    Sig: Take 1 tablet (150 mg total) by mouth once.    Dispense:  1 tablet    Refill:  1   Patient Instructions  Start antibiotic for urine infection, drink plenty of fluids.If needed for yeast infection after antibiotic - I did print diflucan prescription.  Your EKG looks ok today, I will call you about your blood counts and blood sugar tests, and thyroid test will be back in the next week.  Make sure you are drinking plenty of fluids, ok to return to work if feeling better tomorrow, but if dizziness or palpitations return - recheck here or emergency room.    Return to the clinic or go to the nearest emergency room if any of your symptoms worsen or new symptoms occur.  Urinary Tract Infection Urinary tract infections (UTIs) can develop anywhere along your urinary tract. Your urinary tract is your body's drainage system for removing wastes and extra water. Your urinary tract includes two kidneys, two ureters, a bladder, and a urethra. Your kidneys are a pair of bean-shaped organs. Each kidney is about the size of your fist. They are located below your ribs, one on each side of your spine. CAUSES Infections are caused by microbes, which are microscopic organisms, including fungi, viruses, and bacteria. These organisms are so small that they can only be seen through a microscope. Bacteria are the microbes that most commonly cause UTIs. SYMPTOMS  Symptoms of UTIs may vary by age and gender of the patient and by the location of the infection. Symptoms in young women typically include a frequent and intense urge to urinate and a painful, burning feeling in the bladder or urethra during urination. Older women and men are more likely to be tired, shaky, and weak and have muscle aches and abdominal pain. A fever may mean the infection is in your kidneys. Other symptoms of a kidney infection include pain in your back or sides below the ribs, nausea, and vomiting. DIAGNOSIS To diagnose a UTI, your caregiver will ask you about your symptoms. Your caregiver will also ask you to provide a urine sample. The urine sample will be tested for bacteria and white blood cells. White blood cells are made by your body to help fight infection. TREATMENT  Typically, UTIs can be treated with medication. Because most UTIs  are caused by a bacterial infection, they usually can be treated with the use of antibiotics. The choice of antibiotic and length of treatment depend on your symptoms and the type of bacteria causing your infection. HOME CARE INSTRUCTIONS  If you were prescribed  antibiotics, take them exactly as your caregiver instructs you. Finish the medication even if you feel better after you have only taken some of the medication.  Drink enough water and fluids to keep your urine clear or pale yellow.  Avoid caffeine, tea, and carbonated beverages. They tend to irritate your bladder.  Empty your bladder often. Avoid holding urine for long periods of time.  Empty your bladder before and after sexual intercourse.  After a bowel movement, women should cleanse from front to back. Use each tissue only once. SEEK MEDICAL CARE IF:   You have back pain.  You develop a fever.  Your symptoms do not begin to resolve within 3 days. SEEK IMMEDIATE MEDICAL CARE IF:   You have severe back pain or lower abdominal pain.  You develop chills.  You have nausea or vomiting.  You have continued burning or discomfort with urination. MAKE SURE YOU:   Understand these instructions.  Will watch your condition.  Will get help right away if you are not doing well or get worse.   This information is not intended to replace advice given to you by your health care provider. Make sure you discuss any questions you have with your health care provider.   Document Released: 11/28/2004 Document Revised: 11/09/2014 Document Reviewed: 03/29/2011 Elsevier Interactive Patient Education 2016 Elsevier Inc[employer]  Dizziness Dizziness is a common problem. It is a feeling of unsteadiness or light-headedness. You may feel like you are about to faint. Dizziness can lead to injury if you stumble or fall. Anyone can become dizzy, but dizziness is more common in older adults. This condition can be caused by a number of things, including medicines, dehydration, or illness. HOME CARE INSTRUCTIONS Taking these steps may help with your condition: Eating and Drinking  Drink enough fluid to keep your urine clear or pale yellow. This helps to keep you from becoming dehydrated. Try to drink more clear  fluids, such as water.  Do not drink alcohol.  Limit your caffeine intake if directed by your health care provider.  Limit your salt intake if directed by your health care provider. Activity  Avoid making quick movements.  Rise slowly from chairs and steady yourself until you feel okay.  In the morning, first sit up on the side of the bed. When you feel okay, stand slowly while you hold onto something until you know that your balance is fine.  Move your legs often if you need to stand in one place for a long time. Tighten and relax your muscles in your legs while you are standing.  Do not drive or operate heavy machinery if you feel dizzy.  Avoid bending down if you feel dizzy. Place items in your home so that they are easy for you to reach without leaning over. Lifestyle  Do not use any tobacco products, including cigarettes, chewing tobacco, or electronic cigarettes. If you need help quitting, ask your health care provider.  Try to reduce your stress level, such as with yoga or meditation. Talk with your health care provider if you need help. General Instructions  Watch your dizziness for any changes.  Take medicines only as directed by your health care provider. Talk with your health care provider if  you think that your dizziness is caused by a medicine that you are taking.  Tell a friend or a family member that you are feeling dizzy. If he or she notices any changes in your behavior, have this person call your health care provider.  Keep all follow-up visits as directed by your health care provider. This is important. SEEK MEDICAL CARE IF:  Your dizziness does not go away.  Your dizziness or light-headedness gets worse.  You feel nauseous.  You have reduced hearing.  You have new symptoms.  You are unsteady on your feet or you feel like the room is spinning. SEEK IMMEDIATE MEDICAL CARE IF:  You vomit or have diarrhea and are unable to eat or drink anything.  You  have problems talking, walking, swallowing, or using your arms, hands, or legs.  You feel generally weak.  You are not thinking clearly or you have trouble forming sentences. It may take a friend or family member to notice this.  You have chest pain, abdominal pain, shortness of breath, or sweating.  Your vision changes.  You notice any bleeding.  You have a headache.  You have neck pain or a stiff neck.  You have a fever.   This information is not intended to replace advice given to you by your health care provider. Make sure you discuss any questions you have with your health care provider.   Document Released: 08/14/2000 Document Revised: 07/05/2014 Document Reviewed: 02/14/2014 Elsevier Interactive Patient Education 2016 [employer].  Palpitations A palpitation is the feeling that your heartbeat is irregular or is faster than normal. It may feel like your heart is fluttering or skipping a beat. Palpitations are usually not a serious problem. However, in some cases, you may need further medical evaluation. CAUSES  Palpitations can be caused by:  Smoking.  Caffeine or other stimulants, such as diet pills or energy drinks.  Alcohol.  Stress and anxiety.  Strenuous physical activity.  Fatigue.  Certain medicines.  Heart disease, especially if you have a history of irregular heart rhythms (arrhythmias), such as atrial fibrillation, atrial flutter, or supraventricular tachycardia.  An improperly working pacemaker or defibrillator. DIAGNOSIS  To find the cause of your palpitations, your health care provider will take your medical history and perform a physical exam. Your health care provider may also have you take a test called an ambulatory electrocardiogram (ECG). An ECG records your heartbeat patterns over a 24-hour period. You may also have other tests, such as:  Transthoracic echocardiogram (TTE). During echocardiography, sound waves are used to evaluate how blood  flows through your heart.  Transesophageal echocardiogram (TEE).  Cardiac monitoring. This allows your health care provider to monitor your heart rate and rhythm in real time.  Holter monitor. This is a portable device that records your heartbeat and can help diagnose heart arrhythmias. It allows your health care provider to track your heart activity for several days, if needed.  Stress tests by exercise or by giving medicine that makes the heart beat faster. TREATMENT  Treatment of palpitations depends on the cause of your symptoms and can vary greatly. Most cases of palpitations do not require any treatment other than time, relaxation, and monitoring your symptoms. Other causes, such as atrial fibrillation, atrial flutter, or supraventricular tachycardia, usually require further treatment. HOME CARE INSTRUCTIONS   Avoid:  Caffeinated coffee, tea, soft drinks, diet pills, and energy drinks.  Chocolate.  Alcohol.  Stop smoking if you smoke.  Reduce your stress and anxiety. Things  that can help you relax include:  A method of controlling things in your body, such as your heartbeats, with your mind (biofeedback).  Yoga.  Meditation.  Physical activity such as swimming, jogging, or walking.  Get plenty of rest and sleep. SEEK MEDICAL CARE IF:   You continue to have a fast or irregular heartbeat beyond 24 hours.  Your palpitations occur more often. SEEK IMMEDIATE MEDICAL CARE IF:  You have chest pain or shortness of breath.  You have a severe headache.  You feel dizzy or you faint. MAKE SURE YOU:  Understand these instructions.  Will watch your condition.  Will get help right away if you are not doing well or get worse.   This information is not intended to replace advice given to you by your health care provider. Make sure you discuss any questions you have with your health care provider.   Document Released: 02/16/2000 Document Revised: 02/23/2013 Document  Reviewed: 04/19/2011 Elsevier Interactive Patient Education [employer]       By signing my name below, I, Rawaa Al Rifaie, attest that this documentation has been prepared under the direction and in the presence of Meredith Staggers, MD.  Watt Climes Rifaie, Medical Scribe. 01/19/2015.  2:03 PM. I personally performed the services described in this documentation, which was scribed in my presence. The recorded information has been reviewed and considered, and addended by me as needed.

## 2015-01-19 NOTE — Patient Instructions (Addendum)
Start antibiotic for urine infection, drink plenty of fluids.If needed for yeast infection after antibiotic - I did print diflucan prescription.  Your EKG looks ok today, I will call you about your blood counts and blood sugar tests, and thyroid test will be back in the next week.  Make sure you are drinking plenty of fluids, ok to return to work if feeling better tomorrow, but if dizziness or palpitations return - recheck here or emergency room.   Return to the clinic or go to the nearest emergency room if any of your symptoms worsen or new symptoms occur.  Urinary Tract Infection Urinary tract infections (UTIs) can develop anywhere along your urinary tract. Your urinary tract is your body's drainage system for removing wastes and extra water. Your urinary tract includes two kidneys, two ureters, a bladder, and a urethra. Your kidneys are a pair of bean-shaped organs. Each kidney is about the size of your fist. They are located below your ribs, one on each side of your spine. CAUSES Infections are caused by microbes, which are microscopic organisms, including fungi, viruses, and bacteria. These organisms are so small that they can only be seen through a microscope. Bacteria are the microbes that most commonly cause UTIs. SYMPTOMS  Symptoms of UTIs may vary by age and gender of the patient and by the location of the infection. Symptoms in young women typically include a frequent and intense urge to urinate and a painful, burning feeling in the bladder or urethra during urination. Older women and men are more likely to be tired, shaky, and weak and have muscle aches and abdominal pain. A fever may mean the infection is in your kidneys. Other symptoms of a kidney infection include pain in your back or sides below the ribs, nausea, and vomiting. DIAGNOSIS To diagnose a UTI, your caregiver will ask you about your symptoms. Your caregiver will also ask you to provide a urine sample. The urine sample will be  tested for bacteria and white blood cells. White blood cells are made by your body to help fight infection. TREATMENT  Typically, UTIs can be treated with medication. Because most UTIs are caused by a bacterial infection, they usually can be treated with the use of antibiotics. The choice of antibiotic and length of treatment depend on your symptoms and the type of bacteria causing your infection. HOME CARE INSTRUCTIONS  If you were prescribed antibiotics, take them exactly as your caregiver instructs you. Finish the medication even if you feel better after you have only taken some of the medication.  Drink enough water and fluids to keep your urine clear or pale yellow.  Avoid caffeine, tea, and carbonated beverages. They tend to irritate your bladder.  Empty your bladder often. Avoid holding urine for long periods of time.  Empty your bladder before and after sexual intercourse.  After a bowel movement, women should cleanse from front to back. Use each tissue only once. SEEK MEDICAL CARE IF:   You have back pain.  You develop a fever.  Your symptoms do not begin to resolve within 3 days. SEEK IMMEDIATE MEDICAL CARE IF:   You have severe back pain or lower abdominal pain.  You develop chills.  You have nausea or vomiting.  You have continued burning or discomfort with urination. MAKE SURE YOU:   Understand these instructions.  Will watch your condition.  Will get help right away if you are not doing well or get worse.   This information is not intended  to replace advice given to you by your health care provider. Make sure you discuss any questions you have with your health care provider.   Document Released: 11/28/2004 Document Revised: 11/09/2014 Document Reviewed: 03/29/2011 Elsevier Interactive Patient Education 2016 Elsevier Inc.  Dizziness Dizziness is a common problem. It is a feeling of unsteadiness or light-headedness. You may feel like you are about to faint.  Dizziness can lead to injury if you stumble or fall. Anyone can become dizzy, but dizziness is more common in older adults. This condition can be caused by a number of things, including medicines, dehydration, or illness. HOME CARE INSTRUCTIONS Taking these steps may help with your condition: Eating and Drinking  Drink enough fluid to keep your urine clear or pale yellow. This helps to keep you from becoming dehydrated. Try to drink more clear fluids, such as water.  Do not drink alcohol.  Limit your caffeine intake if directed by your health care provider.  Limit your salt intake if directed by your health care provider. Activity  Avoid making quick movements.  Rise slowly from chairs and steady yourself until you feel okay.  In the morning, first sit up on the side of the bed. When you feel okay, stand slowly while you hold onto something until you know that your balance is fine.  Move your legs often if you need to stand in one place for a long time. Tighten and relax your muscles in your legs while you are standing.  Do not drive or operate heavy machinery if you feel dizzy.  Avoid bending down if you feel dizzy. Place items in your home so that they are easy for you to reach without leaning over. Lifestyle  Do not use any tobacco products, including cigarettes, chewing tobacco, or electronic cigarettes. If you need help quitting, ask your health care provider.  Try to reduce your stress level, such as with yoga or meditation. Talk with your health care provider if you need help. General Instructions  Watch your dizziness for any changes.  Take medicines only as directed by your health care provider. Talk with your health care provider if you think that your dizziness is caused by a medicine that you are taking.  Tell a friend or a family member that you are feeling dizzy. If he or she notices any changes in your behavior, have this person call your health care  provider.  Keep all follow-up visits as directed by your health care provider. This is important. SEEK MEDICAL CARE IF:  Your dizziness does not go away.  Your dizziness or light-headedness gets worse.  You feel nauseous.  You have reduced hearing.  You have new symptoms.  You are unsteady on your feet or you feel like the room is spinning. SEEK IMMEDIATE MEDICAL CARE IF:  You vomit or have diarrhea and are unable to eat or drink anything.  You have problems talking, walking, swallowing, or using your arms, hands, or legs.  You feel generally weak.  You are not thinking clearly or you have trouble forming sentences. It may take a friend or family member to notice this.  You have chest pain, abdominal pain, shortness of breath, or sweating.  Your vision changes.  You notice any bleeding.  You have a headache.  You have neck pain or a stiff neck.  You have a fever.   This information is not intended to replace advice given to you by your health care provider. Make sure you discuss any questions  you have with your health care provider.   Document Released: 08/14/2000 Document Revised: 07/05/2014 Document Reviewed: 02/14/2014 Elsevier Interactive Patient Education 2016 [employer]  Palpitations A palpitation is the feeling that your heartbeat is irregular or is faster than normal. It may feel like your heart is fluttering or skipping a beat. Palpitations are usually not a serious problem. However, in some cases, you may need further medical evaluation. CAUSES  Palpitations can be caused by:  Smoking.  Caffeine or other stimulants, such as diet pills or energy drinks.  Alcohol.  Stress and anxiety.  Strenuous physical activity.  Fatigue.  Certain medicines.  Heart disease, especially if you have a history of irregular heart rhythms (arrhythmias), such as atrial fibrillation, atrial flutter, or supraventricular tachycardia.  An improperly working pacemaker  or defibrillator. DIAGNOSIS  To find the cause of your palpitations, your health care provider will take your medical history and perform a physical exam. Your health care provider may also have you take a test called an ambulatory electrocardiogram (ECG). An ECG records your heartbeat patterns over a 24-hour period. You may also have other tests, such as:  Transthoracic echocardiogram (TTE). During echocardiography, sound waves are used to evaluate how blood flows through your heart.  Transesophageal echocardiogram (TEE).  Cardiac monitoring. This allows your health care provider to monitor your heart rate and rhythm in real time.  Holter monitor. This is a portable device that records your heartbeat and can help diagnose heart arrhythmias. It allows your health care provider to track your heart activity for several days, if needed.  Stress tests by exercise or by giving medicine that makes the heart beat faster. TREATMENT  Treatment of palpitations depends on the cause of your symptoms and can vary greatly. Most cases of palpitations do not require any treatment other than time, relaxation, and monitoring your symptoms. Other causes, such as atrial fibrillation, atrial flutter, or supraventricular tachycardia, usually require further treatment. HOME CARE INSTRUCTIONS   Avoid:  Caffeinated coffee, tea, soft drinks, diet pills, and energy drinks.  Chocolate.  Alcohol.  Stop smoking if you smoke.  Reduce your stress and anxiety. Things that can help you relax include:  A method of controlling things in your body, such as your heartbeats, with your mind (biofeedback).  Yoga.  Meditation.  Physical activity such as swimming, jogging, or walking.  Get plenty of rest and sleep. SEEK MEDICAL CARE IF:   You continue to have a fast or irregular heartbeat beyond 24 hours.  Your palpitations occur more often. SEEK IMMEDIATE MEDICAL CARE IF:  You have chest pain or shortness of  breath.  You have a severe headache.  You feel dizzy or you faint. MAKE SURE YOU:  Understand these instructions.  Will watch your condition.  Will get help right away if you are not doing well or get worse.   This information is not intended to replace advice given to you by your health care provider. Make sure you discuss any questions you have with your health care provider.   Document Released: 02/16/2000 Document Revised: 02/23/2013 Document Reviewed: 04/19/2011 Elsevier Interactive Patient Education [employer] Elsevier Inc.

## 2015-01-21 LAB — URINE CULTURE

## 2015-01-29 ENCOUNTER — Telehealth: Payer: Self-pay | Admitting: *Deleted

## 2015-01-29 MED ORDER — METRONIDAZOLE 500 MG PO TABS: 500.0000 mg | ORAL_TABLET | Freq: Two times a day (BID) | ORAL | Status: AC

## 2015-01-29 NOTE — Telephone Encounter (Signed)
Wet prep reviewed. Many epithelial cells, moderate Clue cells. Symptomatic.  Meds ordered this encounter  Medications  . metroNIDAZOLE (FLAGYL) 500 MG tablet    Sig: Take 1 tablet (500 mg total) by mouth 2 (two) times daily.    Dispense:  14 tablet    Refill:  0    Order Specific Question:  Supervising Provider    Answer:  DOOLITTLE, ROBERT P [[id number]]

## 2015-01-29 NOTE — Telephone Encounter (Signed)
Pt.notified

## 2015-01-29 NOTE — Telephone Encounter (Signed)
-----   Message from Shade FloodJeffrey R Greene, MD sent at 01/25/2015  1:14 AM EST ----- Call patient.  Urine culture did show infection, but sensitive to antibiotic prescribed.  Finish course of antibiotic and rtc if not improved - sooner if worse.

## 2015-01-29 NOTE — Telephone Encounter (Signed)
Called pt and advised of labs for urine culture.  She stated that urinary symptoms are better.  She asked about her wet prep.  I advised her that there was bacteria.  She states that she is still having vaginal discharge.  She had to leave the day she was here before the results was done and she would like something called in if possible.

## 2015-02-14 ENCOUNTER — Ambulatory Visit (INDEPENDENT_AMBULATORY_CARE_PROVIDER_SITE_OTHER): Payer: BLUE CROSS/BLUE SHIELD | Admitting: Physician Assistant

## 2015-02-14 VITALS — BP 142/90 | HR 81 | Temp 99.0°F | Resp 18 | Ht 65.0 in | Wt 177.0 lb

## 2015-02-14 DIAGNOSIS — B9789 Other viral agents as the cause of diseases classified elsewhere: Principal | ICD-10-CM

## 2015-02-14 DIAGNOSIS — J069 Acute upper respiratory infection, unspecified: Secondary | ICD-10-CM

## 2015-02-14 MED ORDER — BENZONATATE 100 MG PO CAPS: ORAL_CAPSULE | ORAL | Status: AC

## 2015-02-14 MED ORDER — PROMETHAZINE-CODEINE 6.25-10 MG/5ML PO SYRP: 5.0000 mL | ORAL_SOLUTION | Freq: Four times a day (QID) | ORAL | Status: DC | PRN

## 2015-02-14 MED ORDER — GUAIFENESIN ER 1200 MG PO TB12: 1.0000 | ORAL_TABLET | Freq: Two times a day (BID) | ORAL | Status: AC

## 2015-02-14 NOTE — Patient Instructions (Signed)
Please push fluids.  Tylenol and Motrin for fever and body aches.    

## 2015-02-14 NOTE — Progress Notes (Signed)
   Morgan [name]  MRN: [id number] DOB: 04/02/1975  Subjective:  Pt presents to clinic with cold symptoms over the last week.  She thought it was allergies but then she started to get chest stuff and that is not usual for her allergies.  Her cough is really bothering her sleep.  The cough is non-productive but every time she breaths in and lays down her cough starts and then she has trouble getting it to stop - she has had some episode of post-tussive emesis.  She missed work last night because of these symptoms.  No OTC medications  Patient Active Problem List   Diagnosis Date Noted  . Major depressive disorder, recurrent, severe without psychotic features (HCC)   . MDD (major depressive disorder), recurrent episode, severe (HCC) 11/30/2014  . Ureterolithiasis 04/19/2014  . Migraine 03/18/2013  . OSA on CPAP 03/18/2013    Current Outpatient Prescriptions on File Prior to Visit  Medication Sig Dispense Refill  . citalopram (CELEXA) 20 MG tablet Take 1 tablet (20 mg total) by mouth daily. 30 tablet 0  . propranolol (INDERAL) 20 MG tablet Take 20 mg by mouth 2 (two) times daily.    . traZODone (DESYREL) 50 MG tablet Take 0.5 tablets (25 mg total) by mouth at bedtime as needed for sleep. 30 tablet 0   No current facility-administered medications on file prior to visit.    Allergies  Allergen Reactions  . Stadol [Butorphanol Tartrate] Itching  . Kale Itching and Rash    Review of Systems  Constitutional: Negative for fever and chills.  HENT: Positive for congestion and postnasal drip. Negative for rhinorrhea and sore throat.   Respiratory: Positive for cough. Negative for shortness of breath and wheezing.        H/o childhood asthma, smokes marijuana  Gastrointestinal: Positive for vomiting (post-tussive emesis).  Allergic/Immunologic: Positive for environmental allergies.   Objective:  BP 142/90 mmHg  Pulse 81  Temp(Src) 99 F (37.2 C) (Oral)  Resp 18  Ht 5\' 5"  (1.651 m)   Wt 177 lb (80.287 kg)  BMI 29.45 kg/m2  SpO2 98%  LMP 01/19/2015  Physical Exam  Constitutional: She is oriented to person, place, and time and well-developed, well-nourished, and in no distress.  HENT:  Head: Normocephalic and atraumatic.  Right Ear: Hearing, tympanic membrane, external ear and ear canal normal.  Left Ear: Hearing, tympanic membrane, external ear and ear canal normal.  Nose: Nose normal.  Mouth/Throat: Uvula is midline, oropharynx is clear and moist and mucous membranes are normal.  Eyes: Conjunctivae are normal.  Neck: Normal range of motion.  Cardiovascular: Normal rate, regular rhythm and normal heart sounds.   No murmur heard. Pulmonary/Chest: Effort normal and breath sounds normal. She has no wheezes.  Dry cough in the room  Neurological: She is alert and oriented to person, place, and time. Gait normal.  Skin: Skin is warm and dry.  Psychiatric: Mood, memory, affect and judgment normal.  Vitals reviewed.   Assessment and Plan :  Viral URI with cough - Plan: Guaifenesin (MUCINEX MAXIMUM STRENGTH) 1200 MG TB12, benzonatate (TESSALON) 100 MG capsule, promethazine-codeine (PHENERGAN WITH CODEINE) 6.25-10 MG/5ML syrup   Symptomatic treatment d/w pt.  Benny LennertSarah Berna Gitto PA-C  [facility] 02/14/2015 1:50 PM

## 2015-02-17 ENCOUNTER — Ambulatory Visit (INDEPENDENT_AMBULATORY_CARE_PROVIDER_SITE_OTHER): Payer: BLUE CROSS/BLUE SHIELD | Admitting: Family Medicine

## 2015-02-17 VITALS — BP 112/64 | HR 86 | Temp 98.7°F | Resp 14 | Ht 65.0 in | Wt 174.0 lb

## 2015-02-17 DIAGNOSIS — G43009 Migraine without aura, not intractable, without status migrainosus: Secondary | ICD-10-CM

## 2015-02-17 DIAGNOSIS — J069 Acute upper respiratory infection, unspecified: Secondary | ICD-10-CM

## 2015-02-17 MED ORDER — IPRATROPIUM BROMIDE 0.03 % NA SOLN: 2.0000 | Freq: Four times a day (QID) | NASAL | Status: DC

## 2015-02-17 NOTE — Progress Notes (Signed)
Patient ID: Morgan [name], female    DOB: 11/02/1975  Age: 39 y.o. MRN: [id number]  Chief Complaint  Patient presents with  . Migraine    x 3 days    Subjective:   39 year old lady who was here earlier in the week with an upper respiratory infection. She continues to have a lot of head congestion though taking the medications as directed. She has had a bad migraine, persistent. She does not feel like she is able to go to work today and she missed Wednesday and Thursday because of it. She works at [employer]. She has pain primarily from the left eye back to the left side of the neck. Does not smoke cigarettes, does smoke marijuana couple times a week.  Current allergies, medications, problem list, past/family and social histories reviewed.  Objective:  BP 112/64 mmHg  Pulse 86  Temp(Src) 98.7 F (37.1 C) (Oral)  Resp 14  Ht 5\' 5"  (1.651 m)  Wt 174 lb (78.926 kg)  BMI 28.96 kg/m2  SpO2 99%  LMP 01/19/2015  Patient appears ill. Doesn't look like she feels well. Her TMs are normal. Eyes PERRLA. Throat clear. Neck supple without nodes. Chest clear. Heart regular without murmurs. No carotid bruits. Abdomen soft. Last cycle was early this month. She has had acute type.  Assessment & Plan:   Assessment: 1. Migraine without aura and without status migrainosus, not intractable   2. Acute upper respiratory infection       Plan: We'll try to open up her head with Atrovent which might help the headache also. She takes Benadryl and ibuprofen for headache. Was going to prescribe some tramadol that interact with her citalopram so did not give her that. She is to let us know if she has not get relief from the headache. Give her a work excuse.  No orders of the defined types were placed in this encounter.    Meds ordered this encounter  Medications  . ipratropium (ATROVENT) 0.03 % nasal spray    Sig: Place 2 sprays into both nostrils 4 (four) times daily.    Dispense:  30 mL    Refill:  0          Patient Instructions  Drink plenty of fluids and get enough rest  Stay off work through today  Use the Benadryl and ibuprofen as you previously have for your migraines.  Return if worse or if unable to get relief    No Follow-up on file.   Dilara Navarrete, MD 02/17/2015

## 2015-02-17 NOTE — Patient Instructions (Addendum)
Drink plenty of fluids and get enough rest  Stay off work through today  Use the Benadryl and ibuprofen as you previously have for your migraines.  Advise discontinuing the marijuana which probably adds to head congestion and may increase risk of migraines.  Return if worse or if unable to get relief

## 2015-04-01 ENCOUNTER — Ambulatory Visit (INDEPENDENT_AMBULATORY_CARE_PROVIDER_SITE_OTHER): Payer: BLUE CROSS/BLUE SHIELD | Admitting: Family Medicine

## 2015-04-01 VITALS — BP 132/80 | HR 79 | Temp 98.8°F | Resp 16 | Ht 65.0 in | Wt 178.6 lb

## 2015-04-01 DIAGNOSIS — R110 Nausea: Secondary | ICD-10-CM

## 2015-04-01 DIAGNOSIS — G43011 Migraine without aura, intractable, with status migrainosus: Secondary | ICD-10-CM | POA: Diagnosis not present

## 2015-04-01 MED ORDER — IBUPROFEN 800 MG PO TABS: 800.0000 mg | ORAL_TABLET | Freq: Three times a day (TID) | ORAL | Status: DC | PRN

## 2015-04-01 MED ORDER — KETOROLAC TROMETHAMINE 60 MG/2ML IM SOLN
60.0000 mg | Freq: Once | INTRAMUSCULAR | Status: AC
  Administered 2015-04-01: 60 mg via INTRAMUSCULAR

## 2015-04-01 MED ORDER — ONDANSETRON 4 MG PO TBDP: 4.0000 mg | ORAL_TABLET | Freq: Three times a day (TID) | ORAL | Status: DC | PRN

## 2015-04-01 MED ORDER — ONDANSETRON 4 MG PO TBDP
4.0000 mg | ORAL_TABLET | Freq: Once | ORAL | Status: AC
  Administered 2015-04-01: 4 mg via ORAL

## 2015-04-01 NOTE — Progress Notes (Signed)
Chief Complaint:  Chief Complaint  Patient presents with  . Headache    migraine/ x 3 days    HPI: Morgan [name] is a 40 y.o. female who reports to [facility] today complaining of migraine headache, it it on the left side behind  eye and behind her left ear. She has light and smell sensitivities but not to noise. She hashad anxiousness but no vision changes, CP or SOB  Or palptiations. She has had some blurred vision She has not been out since Wednesdy night. She took advil without releif . Last dose of advil was yesterday She has not been able to drink fluids, however denies any vomiting. Has nausea. LMP was 03/07/2015. Denies any numbness weakness or tingling . Thsi has been since 3 days, this is the 2nd one this month. She has them every week or every other week. Trigger is weather pressure, stress, around her cycle as well. She is about to start her cycle. She has OSA, has depression. Prior hx of suicide attempt with Robaxin OD, she states she feels nothing like she did when this occurrence happened,deneis SI hi hallucination or worsening depression. She previously was referred to neurology but missed appt due to hospitalization for suicide attempt.   Past Medical History  Diagnosis Date  . Allergy   . Sleep apnea   . Anxiety   . Depression   . Asthma   . Abortion 03/09/2013  . Pregnancy induced hypertension   . Kidney stone    Past Surgical History  Procedure Laterality Date  . Mouth surgery  1999  . Cervical cerclage      all 3 pregnancies  . Dilation and curettage of uterus  2001    retained placenta  . Dilation and curettage of uterus  2006    SAB  . Tubal ligation     Social History   Social History  . Marital Status: Married    Spouse Name: [name]  . Number of Children: 1  . Years of Education: 12   Occupational History  . 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 Smoking status: Never Smoker   . Smokeless tobacco: Never Used  . Alcohol Use:  Yes     Comment: less than once a week-holidays, birthday  . Drug Use: 1.00 per week    Special: Marijuana  . Sexual Activity:    Partners: Male    Pharmacist, hospital Protection: None   Other Topics Concern  . None   Social History Narrative   Lives with her husband and their daughter (born 2007).   Family History  Problem Relation Age of Onset  . HIV Mother   . Seizures Mother     secondary to drug abuse  . Drug abuse Mother   . Heart disease Maternal Grandfather   . Diabetes Paternal Grandmother   . Cancer Paternal Grandfather    Allergies  Allergen Reactions  . Stadol [Butorphanol Tartrate] Itching  . Kale Itching and Rash   Prior to Admission medications   Medication Sig Start Date End Date Taking? Authorizing Provider  citalopram (CELEXA) 20 MG tablet Take 1 tablet (20 mg total) by mouth daily. 12/04/14  Yes Thermon Leyland, NP  ipratropium (ATROVENT) 0.03 % nasal spray Place 2 sprays into both nostrils 4 (four) times daily. 02/17/15  Yes Peyton Najjar, MD  propranolol (INDERAL) 20 MG tablet Take 20 mg by mouth 2 (two) times daily.   Yes Historical  Provider, MD  traZODone (DESYREL) 50 MG tablet Take 0.5 tablets (25 mg total) by mouth at bedtime as needed for sleep. 12/04/14  Yes Thermon Leyland, NP  promethazine-codeine (PHENERGAN WITH CODEINE) 6.25-10 MG/5ML syrup Take 5 mLs by mouth every 6 (six) hours as needed for cough. Patient not taking: Reported on 04/01/2015 02/14/15   Morrell Riddle, PA-C     ROS: The patient denies fevers, chills, night sweats, unintentional weight loss, chest pain, palpitations, wheezing, dyspnea on exertion,  vomiting, abdominal pain, dysuria, hematuria, melena, numbness, weakness, or tingling.   All other systems have been reviewed and were otherwise negative with the exception of those mentioned in the HPI and as above.    PHYSICAL EXAM: Filed Vitals:   04/01/15 1449  BP: 132/80  Pulse: 79  Temp: 98.8 F (37.1 C)  Resp: 16   Body mass  index is 29.72 kg/(m^2).   General: Alert, no acute distress HEENT:  Normocephalic, atraumatic, oropharynx patent. EOMI, PERRLA, fundo examnormal Cardiovascular:  Regular rate and rhythm, no rubs murmurs or gallops.  No Carotid bruits, radial pulse intact. No pedal edema.  Respiratory: Clear to auscultation bilaterally.  No wheezes, rales, or rhonchi.  No cyanosis, no use of accessory musculature Abdominal: No organomegaly, abdomen is soft and non-tender, positive bowel sounds. No masses. Skin: No rashes. Neurologic: Facial musculature symmetric. CN 2-12 grossly normal Psychiatric: Patient acts appropriately throughout our interaction. Lymphatic: No cervical or submandibular lymphadenopathy Musculoskeletal: Gait intact. No edema, tenderness   LABS:    EKG/XRAY:   Primary read interpreted by Dr. Conley Rolls at [facility].   ASSESSMENT/PLAN: Encounter Diagnoses  Name Primary?  . Intractable migraine without aura and with status migrainosus Yes  . Nausea without vomiting    Declines IVF She will push fluids at home She is neurologically intact, denies HI/SI/hallucinations Will refer to neuro again for HA sxs, precautions given  Fu prn  Rx zofran and also ibupfroen, will not rx msk relaxers or narcotics, hx of suicide attempt with robaxin in the past   Gross sideeffects, risk and benefits, and alternatives of medications d/w patient. Patient is aware that all medications have potential sideeffects and we are unable to predict every sideeffect or drug-drug interaction that may occur.  Porfirio Bollier DO  04/01/2015 4:01 PM

## 2015-04-04 ENCOUNTER — Encounter: Payer: Self-pay | Admitting: *Deleted

## 2015-04-12 ENCOUNTER — Ambulatory Visit (INDEPENDENT_AMBULATORY_CARE_PROVIDER_SITE_OTHER): Payer: BLUE CROSS/BLUE SHIELD | Admitting: Neurology

## 2015-04-12 ENCOUNTER — Encounter: Payer: Self-pay | Admitting: Neurology

## 2015-04-12 VITALS — BP 131/93 | HR 53 | Resp 16 | Ht 65.0 in | Wt 182.0 lb

## 2015-04-12 DIAGNOSIS — R112 Nausea with vomiting, unspecified: Secondary | ICD-10-CM

## 2015-04-12 DIAGNOSIS — G43711 Chronic migraine without aura, intractable, with status migrainosus: Secondary | ICD-10-CM

## 2015-04-12 MED ORDER — SUMATRIPTAN 20 MG/ACT NA SOLN: 20.0000 mg | Freq: Once | NASAL | Status: DC

## 2015-04-12 MED ORDER — DIVALPROEX SODIUM ER 250 MG PO TB24: 500.0000 mg | ORAL_TABLET | Freq: Every day | ORAL | Status: DC

## 2015-04-12 MED ORDER — ONDANSETRON 4 MG PO TBDP: 4.0000 mg | ORAL_TABLET | Freq: Three times a day (TID) | ORAL | Status: DC | PRN

## 2015-04-12 NOTE — Patient Instructions (Addendum)
Remember to drink plenty of fluid, eat healthy meals and do not skip any meals. Try to eat protein with a every meal and eat a healthy snack such as fruit or nuts in between meals. Try to keep a regular sleep-wake schedule and try to exercise daily, particularly in the form of walking, 20-30 minutes a day, if you can.   As far as your medications are concerned, I would like to suggest:  Depakote. Start with one pill at night then increase to 2 pills. Do not get pregnant. Imitrex nasal spray at onset of headache. May repeat in 2 hours. Do not use more than 2x in a day. Take with Zofran for Nausea.  As far as diagnostic testing: lab today   Our phone number is [phone]. We also have an after hours call service for urgent matters and there is a physician on-call for urgent questions. For any emergencies you know to call 911 or go to the nearest emergency room

## 2015-04-12 NOTE — Progress Notes (Addendum)
[name]    Provider:  Dr Lucia Gaskins Referring Provider: Ethelda Chick, MD Primary Care Physician:  Nilda Simmer, MD  CC:  Migraine  HPI:  Morgan [name] is a 40 y.o. female here as a referral from Dr. Katrinka Blazing for migraine. PMHx depression and anxiety. Also documented is Sleep apnea, kidney stone. Migraines started over 10 years ago. In the past few years they have worsened. She has go to the ED. They are on the left side, pounding, light sensitivity, sound sensitivity, she needs quiet and dark, smells trigger nausea and vomiting, blurred vision. She is having at least 15 headache days a month. At least 8 or 10 migraines. They can last all day. She has an occ aura but not all the time. She take propranolol and celexa. She take ibuprofen a few times a week and no medication. She has a history of kidney stone so can't use Topamax. She had tubal ligation in 2015 so we can use Depakote.   Reviewed notes, labs and imaging from outside physicians, which showed: Per primary care notes, she take ibuprofen and benadryl for her headaches. She has also c/o LBP, dizziness,abdominal pain,URI, dysuria recently.   She was seen in the ED 01/03/2015 for persistent headache for 3 days typical of her migraines, taking ibuprofen and naproxen and she has been seen in the ED multiple times with relief by migraine cocktail.   CT of the head showed no acute intracranial abnormalities including mass lesion or mass effect, hydrocephalus, extra-axial fluid collection, midline shift, hemorrhage, or acute infarction, large ischemic events (personally reviewed images)   Review of Systems: Patient complains of symptoms per HPI as well as the following symptoms: fatigue, blurred vision, palpitations, tremor, depression, anxiety. Pertinent negatives per HPI. All others negative.   Social History   Social History  . Marital Status: Married    Spouse Name: [name]  . Number of Children: 1  . Years of  Education: 12   Occupational History  . 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 Smoking status: Never Smoker   . Smokeless tobacco: Never Used  . Alcohol Use: No  . Drug Use: 1.00 per week    Special: Marijuana  . Sexual Activity:    Partners: Male    Pharmacist, hospital Protection: None   Other Topics Concern  . Not on file   Social History Narrative   Lives with her husband and their daughter (born 2007).   Drinks caffeine daily     Family History  Problem Relation Age of Onset  . HIV Mother   . Seizures Mother     secondary to drug abuse  . Drug abuse Mother   . Heart disease Maternal Grandfather   . Diabetes Maternal Grandfather   . Cancer Maternal Grandfather   . Diabetes Paternal Grandmother   . Cancer Paternal Grandfather   . Heart disease Maternal Grandmother   . Hypertension Maternal Grandmother   . Migraines Neg Hx     Past Medical History  Diagnosis Date  . Allergy   . Sleep apnea   . Anxiety   . Depression   . Asthma   . Abortion 03/09/2013  . Pregnancy induced hypertension   . Kidney stone   . Headache     Past Surgical History  Procedure Laterality Date  . Mouth surgery  1999  . Cervical cerclage      all 3 pregnancies  . Dilation and curettage of uterus  2001  retained placenta  . Dilation and curettage of uterus  2006    SAB  . Tubal ligation      Current Outpatient Prescriptions  Medication Sig Dispense Refill  . citalopram (CELEXA) 20 MG tablet Take 1 tablet (20 mg total) by mouth daily. 30 tablet 0  . ibuprofen (ADVIL,MOTRIN) 800 MG tablet Take 1 tablet (800 mg total) by mouth every 8 (eight) hours as needed. No other NSAIDs, take with food. 30 tablet 1  . ipratropium (ATROVENT) 0.03 % nasal spray Place 2 sprays into both nostrils 4 (four) times daily. 30 mL 0  . ondansetron (ZOFRAN ODT) 4 MG disintegrating tablet Take 1 tablet (4 mg total) by mouth every 8 (eight) hours as needed for nausea or vomiting. 60 tablet 12  .  propranolol (INDERAL) 20 MG tablet Take 20 mg by mouth 2 (two) times daily.    . traZODone (DESYREL) 50 MG tablet Take 0.5 tablets (25 mg total) by mouth at bedtime as needed for sleep. 30 tablet 0  . divalproex (DEPAKOTE ER) 250 MG 24 hr tablet Take 2 tablets (500 mg total) by mouth daily. Start with one pill at night then increase to 2 pills in 2 weeks. 60 tablet 12  . SUMAtriptan (IMITREX) 20 MG/ACT nasal spray Place 1 spray (20 mg total) into the nose once. May repeat in 2 hours if headache persists or recurs. 10 Inhaler 12   No current facility-administered medications for this visit.    Allergies as of 04/12/2015 - Review Complete 04/12/2015  Allergen Reaction Noted  . Stadol [butorphanol tartrate] Itching 07/03/2011  . Kale Itching and Rash 12/14/2012    Vitals: BP 131/93 mmHg  Pulse 53  Resp 16  Ht 5\' 5"  (1.651 m)  Wt 182 lb (82.555 kg)  BMI 30.29 kg/m2  LMP 03/07/2015 Last Weight:  Wt Readings from Last 1 Encounters:  04/12/15 182 lb (82.555 kg)   Last Height:   Ht Readings from Last 1 Encounters:  04/12/15 5\' 5"  (1.651 m)   .Physical exam: Exam: Gen: NAD, conversant, well nourised, obese, well groomed                     CV: RRR, no MRG. No Carotid Bruits. No peripheral edema, warm, nontender Eyes: Conjunctivae clear without exudates or hemorrhage  Neuro: Detailed Neurologic Exam  Speech:    Speech is normal; fluent and spontaneous with normal comprehension.  Cognition:    The patient is oriented to person, place, and time;     recent and remote memory intact;     language fluent;     normal attention, concentration,     fund of knowledge Cranial Nerves:    The pupils are equal, round, and reactive to light. The fundi are normal and spontaneous venous pulsations are present. Visual fields are full to finger confrontation. Extraocular movements are intact. Trigeminal sensation is intact and the muscles of mastication are normal. The face is symmetric. The  palate elevates in the midline. Hearing intact. Voice is normal. Shoulder shrug is normal. The tongue has normal motion without fasciculations.   Coordination:    Normal finger to nose and heel to shin. Normal rapid alternating movements.   Gait:    Heel-toe and tandem gait are normal.   Motor Observation:    No asymmetry, no atrophy, and no involuntary movements noted. Tone:    Normal muscle tone.    Posture:    Posture is normal. normal erect  Strength:    Strength is V/V in the upper and lower limbs.      Sensation: intact to LT     Reflex Exam:  DTR's:    Deep tendon reflexes in the upper and lower extremities are normal bilaterally.   Toes:    The toes are downgoing bilaterally.   Clonus:    Clonus is absent.       Assessment/Plan:  40 year old with chronic mirgaine.   -On propranolol. Can't use Topamax due to nephrolithiasis. -Discussed botox for migraine -Start Depakote for migraine prevention. Also may help with mood. She has had tubal ligation. Discussed teratogenicity, needs back up birth control even with tubal ligation. W Discussed the most common side effects of Depakote including serious reactions which can include hepatotoxicity, pancreatitis, hyponatremia, pancytopenia, thrombocytopenia and patient's wife denies any history of liver disease or electrolyte imbalances. She is to stop for any concerning symptoms especially rash, suicidality, psychosis and hallucinations., And reactions include headache, nausea vomiting, somnolence, thrombocytopenia, dyspepsia, dizziness, diarrhea, abdominal pain, tremor, alopecia, weight changes, appetite changes, constipation and other side effects. Please can stop for anything concerning. -Use imitrex nasal spray for acute menagement. The most common side-effects are feeling sick (nausea), dizziness and dry mouth. In addition, triptans can also cause some people to experience strange sensations. These may be a tightness,  tingling, flushing, and feelings of heaviness or pressure in areas such as the face and limbs, and occasionally the chest. Serious side effects can include stroke, cardiac side effects such as chest tightness, shortness of breath and possible cardiovascular adverse effects. Cont Zofran for nausea  Naomie Dean, MD  [facility] [address] [place], [place] [address]  Phone [phone] Fax [phone]

## 2015-04-13 ENCOUNTER — Telehealth: Payer: Self-pay | Admitting: *Deleted

## 2015-04-13 ENCOUNTER — Ambulatory Visit: Payer: BLUE CROSS/BLUE SHIELD | Admitting: Neurology

## 2015-04-13 LAB — COMPREHENSIVE METABOLIC PANEL
A/G RATIO: 1.6 (ref 1.1–2.5)
ALBUMIN: 4.2 g/dL (ref 3.5–5.5)
ALT: 11 IU/L (ref 0–32)
AST: 12 IU/L (ref 0–40)
Alkaline Phosphatase: 65 IU/L (ref 39–117)
BILIRUBIN TOTAL: 0.8 mg/dL (ref 0.0–1.2)
BUN / CREAT RATIO: 16 (ref 8–20)
BUN: 13 mg/dL (ref 6–20)
CALCIUM: 9.1 mg/dL (ref 8.7–10.2)
CHLORIDE: 103 mmol/L (ref 96–106)
CO2: 24 mmol/L (ref 18–29)
Creatinine, Ser: 0.82 mg/dL (ref 0.57–1.00)
GFR, EST AFRICAN AMERICAN: 105 mL/min/{1.73_m2} (ref >59)
GFR, EST NON AFRICAN AMERICAN: 91 mL/min/{1.73_m2} (ref >59)
Globulin, Total: 2.6 g/dL (ref 1.5–4.5)
Glucose: 84 mg/dL (ref 65–99)
POTASSIUM: 4.4 mmol/L (ref 3.5–5.2)
Sodium: 143 mmol/L (ref 134–144)
TOTAL PROTEIN: 6.8 g/dL (ref 6.0–8.5)

## 2015-04-13 NOTE — Telephone Encounter (Signed)
Called and spoke to pt about normal labs per Dr Ahern. Pt verbalized understanding.  

## 2015-04-13 NOTE — Telephone Encounter (Signed)
-----   Message from Anson Fret, MD sent at 04/13/2015  7:25 AM EST ----- Labs normal thanks

## 2015-04-14 ENCOUNTER — Ambulatory Visit: Payer: BLUE CROSS/BLUE SHIELD | Admitting: Neurology

## 2015-05-17 ENCOUNTER — Encounter: Payer: Self-pay | Admitting: *Deleted

## 2015-05-17 NOTE — Progress Notes (Signed)
Faxed completed prescriber response form back to [facility] about possible drug interaction for citalopram and sumatriptan. Received confirmation.

## 2015-05-28 ENCOUNTER — Emergency Department (HOSPITAL_COMMUNITY)
Admission: EM | Admit: 2015-05-28 | Discharge: 2015-05-28 | Disposition: A | Discharge status: Home / Self Care | Payer: BLUE CROSS/BLUE SHIELD | Attending: Emergency Medicine | Admitting: Emergency Medicine

## 2015-05-28 ENCOUNTER — Emergency Department (HOSPITAL_COMMUNITY): Payer: BLUE CROSS/BLUE SHIELD

## 2015-05-28 ENCOUNTER — Encounter (HOSPITAL_COMMUNITY): Payer: Self-pay

## 2015-05-28 DIAGNOSIS — B349 Viral infection, unspecified: Secondary | ICD-10-CM | POA: Insufficient documentation

## 2015-05-28 DIAGNOSIS — Z87442 Personal history of urinary calculi: Secondary | ICD-10-CM | POA: Insufficient documentation

## 2015-05-28 DIAGNOSIS — G43909 Migraine, unspecified, not intractable, without status migrainosus: Secondary | ICD-10-CM | POA: Diagnosis not present

## 2015-05-28 DIAGNOSIS — R059 Cough, unspecified: Secondary | ICD-10-CM

## 2015-05-28 DIAGNOSIS — J45901 Unspecified asthma with (acute) exacerbation: Secondary | ICD-10-CM | POA: Diagnosis not present

## 2015-05-28 DIAGNOSIS — R197 Diarrhea, unspecified: Secondary | ICD-10-CM | POA: Diagnosis not present

## 2015-05-28 DIAGNOSIS — F419 Anxiety disorder, unspecified: Secondary | ICD-10-CM | POA: Diagnosis not present

## 2015-05-28 DIAGNOSIS — Z79899 Other long term (current) drug therapy: Secondary | ICD-10-CM | POA: Insufficient documentation

## 2015-05-28 DIAGNOSIS — R05 Cough: Secondary | ICD-10-CM | POA: Diagnosis present

## 2015-05-28 DIAGNOSIS — F329 Major depressive disorder, single episode, unspecified: Secondary | ICD-10-CM | POA: Insufficient documentation

## 2015-05-28 DIAGNOSIS — Z8669 Personal history of other diseases of the nervous system and sense organs: Secondary | ICD-10-CM | POA: Diagnosis not present

## 2015-05-28 MED ORDER — METOCLOPRAMIDE HCL 10 MG PO TABS
10.0000 mg | ORAL_TABLET | Freq: Once | ORAL | Status: AC
  Administered 2015-05-28: 10 mg via ORAL
  Filled 2015-05-28: qty 1

## 2015-05-28 MED ORDER — KETOROLAC TROMETHAMINE 30 MG/ML IJ SOLN
30.0000 mg | Freq: Once | INTRAMUSCULAR | Status: AC
  Administered 2015-05-28: 30 mg via INTRAMUSCULAR
  Filled 2015-05-28: qty 1

## 2015-05-28 MED ORDER — DIPHENHYDRAMINE HCL 25 MG PO CAPS
50.0000 mg | ORAL_CAPSULE | Freq: Once | ORAL | Status: AC
  Administered 2015-05-28: 50 mg via ORAL
  Filled 2015-05-28: qty 2

## 2015-05-28 NOTE — ED Notes (Signed)
Onset 2 weeks productive cough-yelow/tan phlegm, chest congestion.  Cough worsening despite taking Mucinex, Tessalon Perles.  Onset yesterday migraine with no relief from Imitrex nasal spray.  Onset today diarrhea x 3, loose, brown and nausea.  NO vomiting, fever, respiratory difficulties.  Pt talking in complete sentences.

## 2015-05-28 NOTE — Discharge Instructions (Signed)
There is not appear to be an emergent cause for your symptoms at this time. Your symptoms are likely due to a virus and should resolve on their own. Follow-up with your doctor next week for reevaluation. Continue taking your cough medicines, Motrin for your discomfort. Return to ED for new or worsening symptoms as we discussed.  Cough, Adult Coughing is a reflex that clears your throat and your airways. Coughing helps to heal and protect your lungs. It is normal to cough occasionally, but a cough that happens with other symptoms or lasts a long time may be a sign of a condition that needs treatment. A cough may last only 2-3 weeks (acute), or it may last longer than 8 weeks (chronic). CAUSES Coughing is commonly caused by:  Breathing in substances that irritate your lungs.  A viral or bacterial respiratory infection.  Allergies.  Asthma.  Postnasal drip.  Smoking.  Acid backing up from the stomach into the esophagus (gastroesophageal reflux).  Certain medicines.  Chronic lung problems, including COPD (or rarely, lung cancer).  Other medical conditions such as heart failure. HOME CARE INSTRUCTIONS  Pay attention to any changes in your symptoms. Take these actions to help with your discomfort:  Take medicines only as told by your health care provider.  If you were prescribed an antibiotic medicine, take it as told by your health care provider. Do not stop taking the antibiotic even if you start to feel better.  Talk with your health care provider before you take a cough suppressant medicine.  Drink enough fluid to keep your urine clear or pale yellow.  If the air is dry, use a cold steam vaporizer or humidifier in your bedroom or your home to help loosen secretions.  Avoid anything that causes you to cough at work or at home.  If your cough is worse at night, try sleeping in a semi-upright position.  Avoid cigarette smoke. If you smoke, quit smoking. If you need help  quitting, ask your health care provider.  Avoid caffeine.  Avoid alcohol.  Rest as needed. SEEK MEDICAL CARE IF:   You have new symptoms.  You cough up pus.  Your cough does not get better after 2-3 weeks, or your cough gets worse.  You cannot control your cough with suppressant medicines and you are losing sleep.  You develop pain that is getting worse or pain that is not controlled with pain medicines.  You have a fever.  You have unexplained weight loss.  You have night sweats. SEEK IMMEDIATE MEDICAL CARE IF:  You cough up blood.  You have difficulty breathing.  Your heartbeat is very fast.   This information is not intended to replace advice given to you by your health care provider. Make sure you discuss any questions you have with your health care provider.   Document Released: 08/17/2010 Document Revised: 11/09/2014 Document Reviewed: 04/27/2014 Elsevier Interactive Patient Education [employer]2016 Elsevier Inc.

## 2015-05-28 NOTE — ED Provider Notes (Signed)
[name]     Arrival date & time 05/28/15  1251 History   First MD Initiated Contact with Patient 05/28/15 1317     Chief Complaint  Patient presents with  . Migraine  . Cough     (Consider location/radiation/quality/duration/timing/severity/associated sxs/prior Treatment) HPI Morgan [name] is a 40 y.o. female who comes in for evaluation of migraine and cough. Patient reports she is an ongoing, productive cough with clear to yellow phlegm over the past 2 weeks not relieved with Mucinex. She also reports a history of migraines with a typical migraine starting yesterday, gradual in onset, not relieved by home Imitrex. She also reports associated diarrhea 3 today, nonbloody. She reports her daughter is also starting to experience the same symptoms at home. She denies any fevers, chills, vomiting, shortness of breath, chest pain, leg swelling, vaginal bleeding or discharge, urinary symptoms, neck pain or stiffness, rash. No other modifying factors.  Past Medical History  Diagnosis Date  . Allergy   . Sleep apnea   . Anxiety   . Depression   . Asthma   . Abortion 03/09/2013  . Pregnancy induced hypertension   . Kidney stone   . Headache    Past Surgical History  Procedure Laterality Date  . Mouth surgery  1999  . Cervical cerclage      all 3 pregnancies  . Dilation and curettage of uterus  2001    retained placenta  . Dilation and curettage of uterus  2006    SAB  . Tubal ligation     Family History  Problem Relation Age of Onset  . HIV Mother   . Seizures Mother     secondary to drug abuse  . Drug abuse Mother   . Heart disease Maternal Grandfather   . Diabetes Maternal Grandfather   . Cancer Maternal Grandfather   . Diabetes Paternal Grandmother   . Cancer Paternal Grandfather   . Heart disease Maternal Grandmother   . Hypertension Maternal Grandmother   . Migraines Neg Hx    Social History  Substance Use Topics  . Smoking status: Never Smoker   .  Smokeless tobacco: Never Used  . Alcohol Use: No   OB History    Gravida Para Term Preterm AB TAB SAB Ectopic Multiple Living   0 0 1     Review of Systems A 10 point review of systems was completed and was negative except for pertinent positives and negatives as mentioned in the history of present illness     Allergies  Stadol and Kale  Home Medications   Prior to Admission medications   Medication Sig Start Date End Date Taking? Authorizing Provider  citalopram (CELEXA) 20 MG tablet Take 1 tablet (20 mg total) by mouth daily. 12/04/14   Thermon Leyland, NP  divalproex (DEPAKOTE ER) 250 MG 24 hr tablet Take 2 tablets (500 mg total) by mouth daily. Start with one pill at night then increase to 2 pills in 2 weeks. 04/12/15   Anson Fret, MD  ibuprofen (ADVIL,MOTRIN) 800 MG tablet Take 1 tablet (800 mg total) by mouth every 8 (eight) hours as needed. No other NSAIDs, take with food. 04/01/15   Thao P Le, DO  ipratropium (ATROVENT) 0.03 % nasal spray Place 2 sprays into both nostrils 4 (four) times daily. 02/17/15   Peyton Najjar, MD  ondansetron (ZOFRAN ODT) 4 MG disintegrating tablet Take 1 tablet (4 mg total) by mouth every  8 (eight) hours as needed for nausea or vomiting. 04/12/15   Anson Fret, MD  propranolol (INDERAL) 20 MG tablet Take 20 mg by mouth 2 (two) times daily.    Historical Provider, MD  SUMAtriptan (IMITREX) 20 MG/ACT nasal spray Place 1 spray (20 mg total) into the nose once. May repeat in 2 hours if headache persists or recurs. 04/12/15   Anson Fret, MD  traZODone (DESYREL) 50 MG tablet Take 0.5 tablets (25 mg total) by mouth at bedtime as needed for sleep. 12/04/14   Thermon Leyland, NP   BP 140/103 mmHg  Pulse 77  Temp(Src) 98.5 F (36.9 C) (Oral)  Resp 18  Ht  (1.651 m)  Wt 81.239 kg  BMI 29.80 kg/m2  SpO2 98%  LMP 05/22/2015 Physical Exam  Constitutional: She is oriented to person, place, and time. She appears well-developed and  well-nourished.  HENT:  Head: Normocephalic and atraumatic.  Mouth/Throat: Oropharynx is clear and moist.  Eyes: Conjunctivae are normal. Pupils are equal, round, and reactive to light. Right eye exhibits no discharge. Left eye exhibits no discharge. No scleral icterus.  Neck: Normal range of motion. Neck supple.  No meningismus or nuchal rigidity  Cardiovascular: Normal rate, regular rhythm and normal heart sounds.   Pulmonary/Chest: Effort normal and breath sounds normal. No respiratory distress.  Mild wheezing in right upper lobe. No other appreciable adventitious lung sounds. No respiratory distress. Oxygen saturation is 98% on room air  Abdominal: Soft. There is no tenderness.  Musculoskeletal: She exhibits no tenderness.  Neurological: She is alert and oriented to person, place, and time.  Cranial Nerves II-XII grossly intact. Motor strength sensation in her baseline. Completes fine motor coordination movements without difficulty. Gait is baseline  Skin: Skin is warm and dry. No rash noted.  Psychiatric: She has a normal mood and affect.  Nursing note and vitals reviewed.   ED Course  Procedures (including critical care time) Labs Review Labs Reviewed - No data to display  Imaging Review Dg Chest 2 View  05/28/2015  CLINICAL DATA:  Cough x2 weeks EXAM: CHEST  2 VIEW COMPARISON:  11/19/2014 FINDINGS: Lungs are clear.  No pleural effusion or pneumothorax. The heart is normal in size. Visualized osseous structures are within normal limits. IMPRESSION: Normal chest radiographs. Electronically Signed   By: Charline Bills M.D.   On: 05/28/2015 14:00   I have personally reviewed and evaluated these images and lab results as part of my medical decision-making.   EKG Interpretation None     Meds given in ED:  Medications  ketorolac (TORADOL) 30 MG/ML injection 30 mg (30 mg Intramuscular Given 05/28/15 1332)  metoCLOPramide (REGLAN) tablet 10 mg (10 mg Oral Given 05/28/15 1332)   diphenhydrAMINE (BENADRYL) capsule 50 mg (50 mg Oral Given 05/28/15 1332)    Discharge Medication List as of 05/28/2015  2:34 PM     Filed Vitals:   05/28/15 1258  BP: 140/103  Pulse: 77  Temp: 98.5 F (36.9 C)  TempSrc: Oral  Resp: 18  Height:  (1.651 m)  Weight: 81.239 kg  SpO2: 98%    MDM  Morgan [name] is a 40 y.o. female who presents for evaluation of URI symptoms, diarrhea, migraine. Physical exam is unremarkable, no nuchal rigidity. Nonfocal neuro exam with baseline gait Migraine is typical of previous pain. Resolved with migraine cocktail. No diarrhea in emergency department. Benign abdominal exam. Due to ongoing nature of cough, obtain chest x-ray which also showed no  acute cardio pulmonary pathology. Overall, patient appears well, nontoxic, hemodynamically stable and afebrile. She reports that she will continue with her home cough medicines, nausea medicines and headache medicines. She reports she will also follow up with her PCP next week for reevaluation. Discussed return precautions. She verbalizes understanding and agrees with this plan and subsequent discharge. Final diagnoses:  Viral illness  Cough       Joycie PeekBenjamin Sergi Gellner, PA-C 05/28/15 1615  Pricilla LovelessScott Goldston, MD 05/28/15 1701

## 2015-05-29 ENCOUNTER — Encounter: Payer: Self-pay | Admitting: Neurology

## 2015-05-29 ENCOUNTER — Telehealth: Payer: Self-pay | Admitting: Neurology

## 2015-05-29 ENCOUNTER — Ambulatory Visit (INDEPENDENT_AMBULATORY_CARE_PROVIDER_SITE_OTHER): Payer: BLUE CROSS/BLUE SHIELD | Admitting: Neurology

## 2015-05-29 VITALS — BP 141/98 | HR 68 | Ht 65.0 in | Wt 179.2 lb

## 2015-05-29 DIAGNOSIS — R519 Headache, unspecified: Secondary | ICD-10-CM

## 2015-05-29 DIAGNOSIS — R51 Headache: Secondary | ICD-10-CM

## 2015-05-29 DIAGNOSIS — G43709 Chronic migraine without aura, not intractable, without status migrainosus: Secondary | ICD-10-CM | POA: Diagnosis not present

## 2015-05-29 MED ORDER — METHYLPREDNISOLONE 4 MG PO TBPK: ORAL_TABLET | ORAL | Status: DC

## 2015-05-29 MED ORDER — DIVALPROEX SODIUM ER 250 MG PO TB24: 750.0000 mg | ORAL_TABLET | Freq: Every day | ORAL | Status: DC

## 2015-05-29 NOTE — Telephone Encounter (Signed)
LVM returning pt call. Dr Lucia GaskinsAhern said we can fit her in at 230pm if pt can come. I will unblock the slot. Just let me know, thank you

## 2015-05-29 NOTE — Telephone Encounter (Signed)
Pt called sts she's had migraine since Friday (05/26/15). She has been taking SUMAtriptan (IMITREX) 20 MG/ACT nasal spray 2 times on Friday, 2 Saturday and 2 on Sunday. Pt went to Ed yesterday 05/28/15 and rec'd migraine cocktail. It helped until about 3:30 this morning. She took advil this morning about 4am. Pt sts nothing is knocking this HA out. Please call

## 2015-05-29 NOTE — Telephone Encounter (Signed)
Pt called said she can come at 2:30 today.

## 2015-05-29 NOTE — Progress Notes (Signed)
[name]    Provider:  Dr Lucia Gaskins Referring Provider: Ethelda Chick, MD Primary Care Physician:  Nilda Simmer, MD  CC: Migraine  Interval history 05/29/2015: She has only had 2 migraines since being seen. So she is slightly improved. The Depakote may be helping a little. No side effects from the Depakote.  Shehas sleep apnea and is non compliant with her cpap. She works days now. She takes the propranolol twice daily. The migraines are better. The imitrex spray does help. Will increase the Depakote. Will start a medrol dosepak. Will see her back in 6 weeks for follow up.Will perform nerve blocks today in the office for the acute migraine. This migraine has lasted for days and is intractable.  HPI 04/12/2015: Morgan [name] is a 40 y.o. female here as a referral from Dr. Katrinka Blazing for migraine. PMHx depression and anxiety. Also documented is Sleep apnea, kidney stone. Migraines started over 10 years ago. In the past few years they have worsened. She has go to the ED. They are on the left side, pounding, light sensitivity, sound sensitivity, she needs quiet and dark, smells trigger nausea and vomiting, blurred vision. She is having at least 15 headache days a month. At least 8 or 10 migraines. They can last all day. She has an occ aura but not all the time. She take propranolol and celexa. She take ibuprofen a few times a week and no medication. She has a history of kidney stone so can't use Topamax. She had tubal ligation in 2015 so we can use Depakote.   Reviewed notes, labs and imaging from outside physicians, which showed: Per primary care notes, she take ibuprofen and benadryl for her headaches. She has also c/o LBP, dizziness,abdominal pain,URI, dysuria recently. She was seen in the ED 01/03/2015 for persistent headache for 3 days typical of her migraines, taking ibuprofen and naproxen and she has been seen in the ED multiple times with relief by migraine cocktail.   CT of  the head showed no acute intracranial abnormalities including mass lesion or mass effect, hydrocephalus, extra-axial fluid collection, midline shift, hemorrhage, or acute infarction, large ischemic events (personally reviewed images)   Review of Systems: Patient complains of symptoms per HPI as well as the following symptoms: fatigue, blurred vision, palpitations, tremor, depression, anxiety. Pertinent negatives per HPI. All others negative.    Social History   Social History  . Marital Status: Married    Spouse Name: [name]  . Number of Children: 1  . Years of Education: 12   Occupational History  . 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 Smoking status: Never Smoker   . Smokeless tobacco: Never Used  . Alcohol Use: No  . Drug Use: 1.00 per week    Special: Marijuana  . Sexual Activity:    Partners: Male    Pharmacist, hospital Protection: None   Other Topics Concern  . Not on file   Social History Narrative   Lives with her husband and their daughter (born 2007).   Drinks caffeine daily     Family History  Problem Relation Age of Onset  . HIV Mother   . Seizures Mother     secondary to drug abuse  . Drug abuse Mother   . Heart disease Maternal Grandfather   . Diabetes Maternal Grandfather   . Cancer Maternal Grandfather   . Diabetes Paternal Grandmother   . Cancer Paternal Grandfather   . Heart disease Maternal Grandmother   .  Hypertension Maternal Grandmother   . Migraines Neg Hx     Past Medical History  Diagnosis Date  . Allergy   . Sleep apnea   . Anxiety   . Depression   . Asthma   . Abortion 03/09/2013  . Pregnancy induced hypertension   . Kidney stone   . Headache     Past Surgical History  Procedure Laterality Date  . Mouth surgery  1999  . Cervical cerclage      all 3 pregnancies  . Dilation and curettage of uterus  2001    retained placenta  . Dilation and curettage of uterus  2006    SAB  . Tubal ligation      Current  Outpatient Prescriptions  Medication Sig Dispense Refill  . citalopram (CELEXA) 20 MG tablet Take 1 tablet (20 mg total) by mouth daily. 30 tablet 0  . divalproex (DEPAKOTE ER) 250 MG 24 hr tablet Take 3 tablets (750 mg total) by mouth daily. Start with one pill at night then increase to 2 pills in 2 weeks. 90 tablet 12  . ibuprofen (ADVIL,MOTRIN) 800 MG tablet Take 1 tablet (800 mg total) by mouth every 8 (eight) hours as needed. No other NSAIDs, take with food. 30 tablet 1  . ipratropium (ATROVENT) 0.03 % nasal spray Place 2 sprays into both nostrils 4 (four) times daily. 30 mL 0  . ondansetron (ZOFRAN ODT) 4 MG disintegrating tablet Take 1 tablet (4 mg total) by mouth every 8 (eight) hours as needed for nausea or vomiting. 60 tablet 12  . propranolol (INDERAL) 20 MG tablet Take 20 mg by mouth 2 (two) times daily.    . SUMAtriptan (IMITREX) 20 MG/ACT nasal spray Place 1 spray (20 mg total) into the nose once. May repeat in 2 hours if headache persists or recurs. 10 Inhaler 12  . traZODone (DESYREL) 50 MG tablet Take 0.5 tablets (25 mg total) by mouth at bedtime as needed for sleep. 30 tablet 0  . methylPREDNISolone (MEDROL DOSEPAK) 4 MG TBPK tablet follow package directions 21 tablet 0   No current facility-administered medications for this visit.    Allergies as of 05/29/2015 - Review Complete 05/29/2015  Allergen Reaction Noted  . Stadol [butorphanol tartrate] Itching 07/03/2011  . Kale Itching and Rash 12/14/2012    Vitals: BP 141/98 mmHg  Pulse 68  Ht 5\' 5"  (1.651 m)  Wt 179 lb 3.2 oz (81.285 kg)  BMI 29.82 kg/m2  LMP 05/22/2015 Last Weight:  Wt Readings from Last 1 Encounters:  05/29/15 179 lb 3.2 oz (81.285 kg)   Last Height:   Ht Readings from Last 1 Encounters:  05/29/15 5\' 5"  (1.651 m)   Physical exam: Exam: Gen: NAD, conversant, well nourised, obese, well groomed                     Eyes: Conjunctivae clear without exudates or hemorrhage  Neuro: Detailed  Neurologic Exam  Speech:    Speech is normal; fluent and spontaneous with normal comprehension.  Cognition:    The patient is oriented to person, place, and time;     recent and remote memory intact;     language fluent;     normal attention, concentration,     fund of knowledge Cranial Nerves:    The pupils are equal, round, and reactive to light. The fundi are normal and spontaneous venous pulsations are present. Visual fields are full to finger confrontation. Extraocular movements are intact. Trigeminal sensation  is intact and the muscles of mastication are normal. The face is symmetric. The palate elevates in the midline. Hearing intact. Voice is normal. Shoulder shrug is normal. The tongue has normal motion without fasciculations.   Motor Observation:    No asymmetry, no atrophy, and no involuntary movements noted. Tone:    Normal muscle tone.    Posture:    Posture is normal. normal erect    Strength:    Strength is V/V in the upper and lower limbs.        Assessment/Plan: 40 year old with chronic mirgaine.   -On propranolol. Can't use Topamax due to nephrolithiasis. Continue -Discussed botox for migraine -Increase Depakote for migraine prevention. Also may help with mood. She has had tubal ligation. Discussed teratogenicity, needs back up birth control even with tubal ligation. W Discussed the most common side effects of Depakote including serious reactions which can include hepatotoxicity, pancreatitis, hyponatremia, pancytopenia, thrombocytopenia and patient's wife denies any history of liver disease or electrolyte imbalances. She is to stop for any concerning symptoms especially rash, suicidality, psychosis and hallucinations., And reactions include headache, nausea vomiting, somnolence, thrombocytopenia, dyspepsia, dizziness, diarrhea, abdominal pain, tremor, alopecia, weight changes, appetite changes, constipation and other side effects. Please can stop for anything  concerning. -Use imitrex nasal spray for acute menagement. The most common side-effects are feeling sick (nausea), dizziness and dry mouth. In addition, triptans can also cause some people to experience strange sensations. These may be a tightness, tingling, flushing, and feelings of heaviness or pressure in areas such as the face and limbs, and occasionally the chest. Serious side effects can include stroke, cardiac side effects such as chest tightness, shortness of breath and possible cardiovascular adverse effects. -Cont Zofran for nausea - Imitrex nasal for acute management     NERVE BLOCK PROCEDURE NOTE  History:   Procedure: Patient was consented for bilateral occipital and bilateral trigeminal nerve blocks. A solution containing 0.5% /ml Bupivacaine 5-cc and 2% lidocaine 5-cc was prepared in 3 3-CC syringes with 30 gauge 1/2 inch needle.   12 Target areas in the occipital, suboccipital and temporal regions were identified via palpation and pain response.The sites junctions were sterilized with alcohol wipes. The contents of each syringe was injected in a fanlike fashion. The headache improved from 8/10 to 0/10. Patient tolerated the procedure well and no complications were noted.    Consent was provided below and patient acknowledged understanding:    Lidocaine 2%-33mL total  Lot: [id number]  Expiration 01/2018  NDC: 45409-811-91   Marcaine 0.5%-74mL total  Lot: [id number]  Expiration: 10/02/2016  NDC: [phone]   What to expect afterwards?  Immediately after the injection, the back of your head may feel warm and numb. You may also experience reduction in the pain. The local anaesthetic wears off in a few hours.  The pain relief is vary variable and can last from a few days to several months. Some patients do not experience any pain relief. Hence it is difficult to predict the outcome of the injection treatment in a particular patient.   There may be some discomfort  at the injection site for a couple of days after treatment, however, this should settle quite quickly. We advise you to take things easy for the rest of the day. Continue taking your pain medication as advised by your consultant or until you feel benefit from the treatment.   What are the side effects / complications?  Common   Soreness / bruising at the injection  site.   Temporary increase (up to 7 days) in pain following procedure.   Rare   Bleeding   Infection at the injection site   Allergic reaction   New pain   Worsening pain   Naomie DeanAntonia Alida Greiner, MD  [facility] [address] [place], [place] [id number]  Phone [phone] Fax [phone]  A total of 30  minutes was spent face-to-face with this patient. Over half this time was spent on counseling patient on the migraine diagnosis and different diagnostic and therapeutic options available.

## 2015-05-29 NOTE — Patient Instructions (Signed)
Remember to drink plenty of fluid, eat healthy meals and do not skip any meals. Try to eat protein with a every meal and eat a healthy snack such as fruit or nuts in between meals. Try to keep a regular sleep-wake schedule and try to exercise daily, particularly in the form of walking, 20-30 minutes a day, if you can.   As far as your medications are concerned, I would like to suggest: Medrol dose pak (steroid dose pak) as directed Increase Depakote to 3 pills at ngith  I would like to see you back in 6 weeks, sooner if we need to. Please call us with any interim questions, concerns, problems, updates or refill requests.   Please also call us for any test results so we can go over those with you on the phone.  My clinical assistant and will answer any of your questions and relay your messages to me and also relay most of my messages to you.   Our phone number is [phone]. We also have an after hours call service for urgent matters and there is a physician on-call for urgent questions. For any emergencies you know to call 911 or go to the nearest emergency room

## 2015-05-29 NOTE — Telephone Encounter (Signed)
Placed pt on Dr Lucia GaskinsAhern schedule at 230pm[facility].

## 2015-05-31 DIAGNOSIS — G43709 Chronic migraine without aura, not intractable, without status migrainosus: Secondary | ICD-10-CM | POA: Insufficient documentation

## 2015-06-20 ENCOUNTER — Encounter: Payer: Self-pay | Admitting: *Deleted

## 2015-06-20 NOTE — Progress Notes (Signed)
Faxed request for 90 day supply of divalproex 250mg  tab back to pharmacy with 4 refills 06/20/15. Received confirmation.

## 2015-07-12 ENCOUNTER — Ambulatory Visit (INDEPENDENT_AMBULATORY_CARE_PROVIDER_SITE_OTHER): Payer: BLUE CROSS/BLUE SHIELD | Admitting: Neurology

## 2015-07-12 ENCOUNTER — Encounter: Payer: Self-pay | Admitting: Neurology

## 2015-07-12 VITALS — BP 135/92 | HR 56 | Ht 65.0 in | Wt 184.6 lb

## 2015-07-12 DIAGNOSIS — G43011 Migraine without aura, intractable, with status migrainosus: Secondary | ICD-10-CM | POA: Diagnosis not present

## 2015-07-12 DIAGNOSIS — G500 Trigeminal neuralgia: Secondary | ICD-10-CM | POA: Diagnosis not present

## 2015-07-12 NOTE — Progress Notes (Signed)
Procedure: The patient was placed in the supine position. A temperature strip was added to the cheek area after the area was cleaned with alcohol. The Sphenocath was lubricated with gel, and placed in the right naris. The catheter was inserted above the middle turbinate to the posterior nasal cavity, and then withdrawn 1 cm. The catheter was deployed and rotated approximately 20 towards the nose. 2-1/2 mL of 2% lidocaine was deployed. The patient was asked to swallow during the injection. The patient demonstrated erythema of the sclera of the eye on this side, and an increase in the cheek temperature was noted from 94 F to 96 F.  This process was repeated on the left side, with similar results. The increase in cheek temperature was documented from 6494 F to 96 F.  The patient tolerated the procedure well. No complications of the procedure were noted. The patient was kept in the supine position for 8 minutes following the procedure. She was given small sips of water after sitting up following the procedure.  Lidocaine 2% NDC [id number]  Lidocaine 2% 5ml Lot: [id number]  Expiration 01/2019  NDC: 84696-295-2863323-486-27  Anson FretAhern, Karigan Cloninger B   Sphenocath did not improve headache, then performed trigger point injections and  nerve blocks   NERVE BLOCK PROCEDURE NOTE    Procedure: Patient was consented for bilateral bilateral trigeminal nerve blocks and  trigger point injections. A solution containing ingredients below was prepared in 4 3-CC syringes with 30 gauge 1/2 inch needle.   10 Target areas were identified via palpation and pain response.The sites junctions were sterilized with alcohol wipes. The contents of each syringe was injected in a fanlike fashion. The headache improved from 10/10 to 2/10. Patient tolerated the procedure well and no complications were noted.    Consent was provided below and patient acknowledged understanding:   Nerve Block:  Lidocaine 2% 5ml Lot: [id number]  Expiration  01/2019  NDC: 10272-536-6463323-486-27   Marcaine 0.5% 5ml Lot: [id number]  Expiration: 10/02/2016  NDC: 4034-7425-950409-1162-01   What to expect afterwards?  Immediately after the injection, the back of your head may feel warm and numb. You may also experience reduction in the pain. The local anaesthetic wears off in a few hours and the steroid usually takes  3-7 days to take effect.   The pain relief is vary variable and can last from a few days to several months. Some patients do not experience any pain relief. Hence it is difficult to predict the outcome of the injection treatment in a particular patient.   There may be some discomfort at the injection site for a couple of days after treatment, however, this should settle quite quickly. We advise you to take things easy for the rest of the day. Continue taking your pain medication as advised by your consultant or until you feel benefit from the treatment.   What are the side effects / complications?  Common   Soreness / bruising at the injection site.   Temporary increase (up to 7 days) in pain following procedure.   Rare   Bleeding   Infection at the injection site   Allergic reaction   New pain   Worsening pain

## 2015-07-12 NOTE — Progress Notes (Signed)
Sphenocath:  Left cheek temp prior to procedure: 94 Right cheek temp prior to procedure: 94  Left cheek temp after procedure: 96 Right cheek temp after procedure: 96  Lidocaine 2%- 5mL total Lot: [id number] Expiration: 11/20 NDC: [id number]

## 2015-07-24 ENCOUNTER — Ambulatory Visit (INDEPENDENT_AMBULATORY_CARE_PROVIDER_SITE_OTHER): Payer: BLUE CROSS/BLUE SHIELD | Admitting: Family Medicine

## 2015-07-24 VITALS — BP 110/70 | HR 78 | Temp 98.3°F | Resp 18 | Ht 65.0 in | Wt 192.0 lb

## 2015-07-24 DIAGNOSIS — N898 Other specified noninflammatory disorders of vagina: Secondary | ICD-10-CM

## 2015-07-24 DIAGNOSIS — N912 Amenorrhea, unspecified: Secondary | ICD-10-CM

## 2015-07-24 DIAGNOSIS — R5383 Other fatigue: Secondary | ICD-10-CM

## 2015-07-24 DIAGNOSIS — B9689 Other specified bacterial agents as the cause of diseases classified elsewhere: Secondary | ICD-10-CM

## 2015-07-24 DIAGNOSIS — N760 Acute vaginitis: Secondary | ICD-10-CM

## 2015-07-24 DIAGNOSIS — R600 Localized edema: Secondary | ICD-10-CM | POA: Diagnosis not present

## 2015-07-24 DIAGNOSIS — A499 Bacterial infection, unspecified: Secondary | ICD-10-CM

## 2015-07-24 LAB — POCT CBC
GRANULOCYTE PERCENT: 48.5 % (ref 37–80)
HEMATOCRIT: 39.5 % (ref 37.7–47.9)
Hemoglobin: 13.1 g/dL (ref 12.2–16.2)
LYMPH, POC: 2.6 (ref 0.6–3.4)
MCH, POC: 27.3 pg (ref 27–31.2)
MCHC: 33.2 g/dL (ref 31.8–35.4)
MCV: 82.2 fL (ref 80–97)
MID (CBC): 0.4 (ref 0–0.9)
MPV: 7.9 fL (ref 0–99.8)
POC Granulocyte: 2.9 (ref 2–6.9)
POC LYMPH %: 44 % (ref 10–50)
POC MID %: 7.5 % (ref 0–12)
Platelet Count, POC: 211 10*3/uL (ref 142–424)
RBC: 4.81 M/uL (ref 4.04–5.48)
RDW, POC: 15.5 %
WBC: 6 10*3/uL (ref 4.6–10.2)

## 2015-07-24 LAB — POCT WET + KOH PREP
Trich by wet prep: ABSENT
YEAST BY KOH: ABSENT
Yeast by wet prep: ABSENT

## 2015-07-24 LAB — POCT URINALYSIS DIP (MANUAL ENTRY)
Bilirubin, UA: NEGATIVE
Blood, UA: NEGATIVE
GLUCOSE UA: NEGATIVE
Ketones, POC UA: NEGATIVE
Leukocytes, UA: NEGATIVE
Nitrite, UA: NEGATIVE
Protein Ur, POC: NEGATIVE
SPEC GRAV UA: 1.02
UROBILINOGEN UA: 0.2
pH, UA: 6

## 2015-07-24 LAB — COMPREHENSIVE METABOLIC PANEL
ALK PHOS: 69 U/L (ref 33–115)
ALT: 13 U/L (ref 6–29)
AST: 16 U/L (ref 10–30)
Albumin: 3.9 g/dL (ref 3.6–5.1)
BILIRUBIN TOTAL: 0.4 mg/dL (ref 0.2–1.2)
BUN: 10 mg/dL (ref 7–25)
CO2: 28 mmol/L (ref 20–31)
CREATININE: 0.7 mg/dL (ref 0.50–1.10)
Calcium: 8.7 mg/dL (ref 8.6–10.2)
Chloride: 103 mmol/L (ref 98–110)
Glucose, Bld: 63 mg/dL — ABNORMAL LOW (ref 65–99)
POTASSIUM: 4.2 mmol/L (ref 3.5–5.3)
Sodium: 139 mmol/L (ref 135–146)
TOTAL PROTEIN: 6.7 g/dL (ref 6.1–8.1)

## 2015-07-24 LAB — TSH: TSH: 0.52 mIU/L

## 2015-07-24 LAB — POCT URINE PREGNANCY: PREG TEST UR: NEGATIVE

## 2015-07-24 MED ORDER — METRONIDAZOLE 500 MG PO TABS: 500.0000 mg | ORAL_TABLET | Freq: Two times a day (BID) | ORAL | Status: DC

## 2015-07-24 NOTE — Progress Notes (Signed)
Subjective:  By signing my name below, I, Stann Ore, attest that this documentation has been prepared under the direction and in the presence of Norberto Sorenson, MD. Electronically Signed: Stann Ore, Scribe. 07/24/2015 , 12:39 PM .  Patient was seen in Room 8 .   Patient ID: Morgan [name], female    DOB: 12-Feb-1976, 40 y.o.   MRN: [id number] Chief Complaint  Patient presents with  . Ankle Pain    left ankle swelling since yesterday  . Dizziness  . Amenorrhea  . Vaginitis   HPI Morgan [name] is a 40 y.o. female who presents to [facility] complaining of multiple concerns. She has history of chronic migraines, followed by Dr. Lucia Gaskins in neurology. She also has chronic trigeminal neuralgia and is on Depakote. Of note, patient did have suicide attempt by drug overdose in September, and was positive for marijuana at that time.   Bilateral ankle swelling She woke up yesterday morning with bilateral swelling in ankles "out of the blue", left worse than right. She informs eating steak the evening prior. She has been drinking plenty of fluids and had some improvement today. She denies prior problems in her ankles.   Dizziness Her dizziness and lightheadedness is mostly associated with migraines. She mentions that her BP fluctuates. She checks her BP at [facility] occasionally. She denies any myalgia, chest pain or palpitations.   Vaginal Discharge Patient mentions having vaginal discharge with vaginal pain, lower abdominal pain, back pain and flank pain ongoing for a few weeks. She will see Dr. Jennette Kettle (her OBGYN) with an appointment on 08/01/15. She notes amenorrhea for a few months; last one was 2 months late. She is currently sexually active. She has pshx of tubal ligation. She denies taking much ibuprofen lately. She denies urinary symptoms. She denies yeast treatments or douching.   Past Medical History  Diagnosis Date  . Allergy   . Sleep apnea   . Anxiety   . Depression   . Asthma   .  Abortion 03/09/2013  . Pregnancy induced hypertension   . Kidney stone   . Headache    Prior to Admission medications   Medication Sig Start Date End Date Taking? Authorizing Provider  citalopram (CELEXA) 20 MG tablet Take 1 tablet (20 mg total) by mouth daily. 12/04/14  Yes Thermon Leyland, NP  divalproex (DEPAKOTE ER) 250 MG 24 hr tablet Take 3 tablets (750 mg total) by mouth daily. Start with one pill at night then increase to 2 pills in 2 weeks. 05/29/15  Yes Anson Fret, MD  ibuprofen (ADVIL,MOTRIN) 800 MG tablet Take 1 tablet (800 mg total) by mouth every 8 (eight) hours as needed. No other NSAIDs, take with food. 04/01/15  Yes Thao P Le, DO  ipratropium (ATROVENT) 0.03 % nasal spray Place 2 sprays into both nostrils 4 (four) times daily. 02/17/15  Yes Peyton Najjar, MD  ondansetron (ZOFRAN ODT) 4 MG disintegrating tablet Take 1 tablet (4 mg total) by mouth every 8 (eight) hours as needed for nausea or vomiting. 04/12/15  Yes Anson Fret, MD  propranolol (INDERAL) 20 MG tablet Take 20 mg by mouth 2 (two) times daily.   Yes Historical Provider, MD  SUMAtriptan (IMITREX) 20 MG/ACT nasal spray Place 1 spray (20 mg total) into the nose once. May repeat in 2 hours if headache persists or recurs. 04/12/15  Yes Anson Fret, MD  traZODone (DESYREL) 50 MG tablet Take 0.5 tablets (25 mg total) by mouth at bedtime as  needed for sleep. 12/04/14  Yes Thermon LeylandLaura A Davis, NP   Allergies  Allergen Reactions  . Stadol [Butorphanol Tartrate] Itching  . Kale Itching and Rash    Review of Systems  Gastrointestinal: Positive for abdominal pain. Negative for nausea, vomiting, diarrhea and constipation.  Genitourinary: Positive for flank pain, vaginal discharge, vaginal pain and menstrual problem. Negative for dysuria.  Musculoskeletal: Positive for back pain, joint swelling and arthralgias.  Neurological: Positive for dizziness and light-headedness.       Objective:   Physical Exam  Constitutional: She  is oriented to person, place, and time. She appears well-developed and well-nourished. No distress.  HENT:  Head: Normocephalic and atraumatic.  Eyes: EOM are normal. Pupils are equal, round, and reactive to light.  Neck: Neck supple.  Cardiovascular: Normal rate.   Pulmonary/Chest: Effort normal. No respiratory distress.  Genitourinary:  Normal labia, normal vagina, large cervix with nabothian cyst and moderate amount of vaginal discharge, no cervical motion tenderness, no adnexal masses or tenderness  Musculoskeletal: Normal range of motion.  Neurological: She is alert and oriented to person, place, and time.  Skin: Skin is warm and dry.  Psychiatric: She has a normal mood and affect. Her behavior is normal.  Nursing note and vitals reviewed.  BP 110/70 mmHg  Pulse 78  Temp(Src) 98.3 F (36.8 C) (Oral)  Resp 18  Ht 5\' 5"  (1.651 m)  Wt 192 lb (87.091 kg)  BMI 31.95 kg/m2  SpO2 99%  LMP 05/22/2015   Results for orders placed or performed in visit on 07/24/15  POCT CBC  Result Value Ref Range   WBC 6.0 4.6 - 10.2 K/uL   Lymph, poc 2.6 0.6 - 3.4   POC LYMPH PERCENT 44.0 10 - 50 %L   MID (cbc) 0.4 0 - 0.9   POC MID % 7.5 0 - 12 %M   POC Granulocyte 2.9 2 - 6.9   Granulocyte percent 48.5 37 - 80 %G   RBC 4.81 4.04 - 5.48 M/uL   Hemoglobin 13.1 12.2 - 16.2 g/dL   HCT, POC 16.139.5 09.637.7 - 47.9 %   MCV 82.2 80 - 97 fL   MCH, POC 27.3 27 - 31.2 pg   MCHC 33.2 31.8 - 35.4 g/dL   RDW, POC 04.515.5 %   Platelet Count, POC 211 142 - 424 K/uL   MPV 7.9 0 - 99.8 fL  POCT urinalysis dipstick  Result Value Ref Range   Color, UA yellow yellow   Clarity, UA clear clear   Glucose, UA negative negative   Bilirubin, UA negative negative   Ketones, POC UA negative negative   Spec Grav, UA 1.020    Blood, UA negative negative   pH, UA 6.0    Protein Ur, POC negative negative   Urobilinogen, UA 0.2    Nitrite, UA Negative Negative   Leukocytes, UA Negative Negative  POCT urine pregnancy    Result Value Ref Range   Preg Test, Ur Negative Negative  POCT Wet + KOH Prep  Result Value Ref Range   Yeast by KOH Absent Present, Absent   Yeast by wet prep Absent Present, Absent   WBC by wet prep Many (A) None, Few, Too numerous to count   Clue Cells Wet Prep HPF POC Few (A) None, Too numerous to count   Trich by wet prep Absent Present, Absent   Bacteria Wet Prep HPF POC Few None, Few, Too numerous to count   Epithelial Cells By [employer]ell RubbermaidWet Pref ([facility])  Few None, Few, Too numerous to count   RBC,UR,HPF,POC None None RBC/hpf      Assessment & Plan:   1. Amenorrhea   2. Vaginal discharge   3. Other fatigue   4. Pedal edema - low sodium diet, increase H20, elevate  5. Bacterial vaginosis     Orders Placed This Encounter  Procedures  . GC/Chlamydia Probe Amp    Order Specific Question:  Source    Answer:  cervix  . TSH  . Comprehensive metabolic panel  . Care order/instruction    AVS printed - let patient go!  Marland Kitchen POCT CBC  . POCT urinalysis dipstick  . POCT urine pregnancy  . POCT Wet + KOH Prep    Meds ordered this encounter  Medications  . metroNIDAZOLE (FLAGYL) 500 MG tablet    Sig: Take 1 tablet (500 mg total) by mouth 2 (two) times daily.    Dispense:  14 tablet    Refill:  0    I personally performed the services described in this documentation, which was scribed in my presence. The recorded information has been reviewed and considered, and addended by me as needed.   Norberto Sorenson, M.D.  [facility] [address] [place], [place] [address] [phone] phone [phone] fax  08/05/2015 11:16 PM

## 2015-07-24 NOTE — Patient Instructions (Addendum)
IF you received an x-ray today, you will receive an invoice from [facility]. Please contact [facility] at [phone] with questions or concerns regarding your invoice.   IF you received labwork today, you will receive an invoice from [facility]. Please contact [facility] at [phone] with questions or concerns regarding your invoice.   Our billing staff will not be able to assist you with questions regarding bills from these companies.  You will be contacted with the lab results as soon as they are available. The fastest way to get your results is to activate your My Chart account. Instructions are located on the last page of this paperwork. If you have not heard from us regarding the results in 2 weeks, please contact this office.    Peripheral Edema You have swelling in your legs (peripheral edema). This swelling is due to excess accumulation of salt and water in your body. Edema may be a sign of heart, kidney or liver disease, or a side effect of a medication. It may also be due to problems in the leg veins. Elevating your legs and using special support stockings may be very helpful, if the cause of the swelling is due to poor venous circulation. Avoid long periods of standing, whatever the cause. Treatment of edema depends on identifying the cause. Chips, pretzels, pickles and other salty foods should be avoided. Restricting salt in your diet is almost always needed. Water pills (diuretics) are often used to  DASH Eating Plan DASH stands for "Dietary Approaches to Stop Hypertension." The DASH eating plan is a healthy eating plan that has been shown to reduce high blood pressure (hypertension). Additional health benefits may include reducing the risk of type 2 diabetes mellitus, heart disease, and stroke. The DASH eating plan may also help with weight loss. WHAT DO I NEED TO KNOW ABOUT THE DASH EATING PLAN? For the DASH eating plan, you will  follow these general guidelines:  Choose foods with a percent daily value for sodium of less than 5% (as listed on the food label).  Use salt-free seasonings or herbs instead of table salt or sea salt.  Check with your health care provider or pharmacist before using salt substitutes.  Eat lower-sodium products, often labeled as "lower sodium" or "no salt added."  Eat fresh foods.  Eat more vegetables, fruits, and low-fat dairy products.  Choose whole grains. Look for the word "whole" as the first word in the ingredient list.  Choose fish and skinless chicken or [place] more often than red meat. Limit fish, poultry, and meat to 6 oz (170 g) each day.  Limit sweets, desserts, sugars, and sugary drinks.  Choose heart-healthy fats.  Limit cheese to 1 oz (28 g) per day.  Eat more home-cooked food and less restaurant, buffet, and fast food.  Limit fried foods.  Cook foods using methods other than frying.  Limit canned vegetables. If you do use them, rinse them well to decrease the sodium.  When eating at a restaurant, ask that your food be prepared with less salt, or no salt if possible. WHAT FOODS CAN I EAT? Seek help from a dietitian for individual calorie needs. Grains Whole grain or whole wheat bread. Brown rice. Whole grain or whole wheat pasta. Quinoa, bulgur, and whole grain cereals. Low-sodium cereals. Corn or whole wheat flour tortillas. Whole grain cornbread. Whole grain crackers. Low-sodium crackers. Vegetables Fresh or frozen vegetables (raw, steamed, roasted, or grilled). Low-sodium or reduced-sodium tomato and vegetable  juices. Low-sodium or reduced-sodium tomato sauce and paste. Low-sodium or reduced-sodium canned vegetables.  Fruits All fresh, canned (in natural juice), or frozen fruits. Meat and Other Protein Products Ground beef (85% or leaner), grass-fed beef, or beef trimmed of fat. Skinless chicken or [place]. Ground chicken or [place]. Pork trimmed of fat. All  fish and seafood. Eggs. Dried beans, peas, or lentils. Unsalted nuts and seeds. Unsalted canned beans. Dairy Low-fat dairy products, such as skim or 1% milk, 2% or reduced-fat cheeses, low-fat ricotta or cottage cheese, or plain low-fat yogurt. Low-sodium or reduced-sodium cheeses. Fats and Oils Tub margarines without trans fats. Light or reduced-fat mayonnaise and salad dressings (reduced sodium). Avocado. Safflower, olive, or canola oils. Natural peanut or almond butter. Other Unsalted popcorn and pretzels. The items listed above may not be a complete list of recommended foods or beverages. Contact your dietitian for more options. WHAT FOODS ARE NOT RECOMMENDED? Grains White bread. White pasta. White rice. Refined cornbread. Bagels and croissants. Crackers that contain trans fat. Vegetables Creamed or fried vegetables. Vegetables in a cheese sauce. Regular canned vegetables. Regular canned tomato sauce and paste. Regular tomato and vegetable juices. Fruits Dried fruits. Canned fruit in light or heavy syrup. Fruit juice. Meat and Other Protein Products Fatty cuts of meat. Ribs, chicken wings, bacon, sausage, bologna, salami, chitterlings, fatback, hot dogs, bratwurst, and packaged luncheon meats. Salted nuts and seeds. Canned beans with salt. Dairy Whole or 2% milk, cream, half-and-half, and cream cheese. Whole-fat or sweetened yogurt. Full-fat cheeses or blue cheese. Nondairy creamers and whipped toppings. Processed cheese, cheese spreads, or cheese curds. Condiments Onion and garlic salt, seasoned salt, table salt, and sea salt. Canned and packaged gravies. Worcestershire sauce. Tartar sauce. Barbecue sauce. Teriyaki sauce. Soy sauce, including reduced sodium. Steak sauce. Fish sauce. Oyster sauce. Cocktail sauce. Horseradish. Ketchup and mustard. Meat flavorings and tenderizers. Bouillon cubes. Hot sauce. Tabasco sauce. Marinades. Taco seasonings. Relishes. Fats and Oils Butter, stick  margarine, lard, shortening, ghee, and bacon fat. Coconut, palm kernel, or palm oils. Regular salad dressings. Other Pickles and olives. Salted popcorn and pretzels. The items listed above may not be a complete list of foods and beverages to avoid. Contact your dietitian for more information. WHERE CAN I FIND MORE INFORMATION? National Heart, Lung, and Blood Institute: CablePromo.it   This information is not intended to replace advice given to you by your health care provider. Make sure you discuss any questions you have with your health care provider.   Document Released: 02/07/2011 Document Revised: 03/11/2014 Document Reviewed: 12/23/2012 Elsevier Interactive Patient Education [employer] remove the excess salt and water from your body via urine. These medicines prevent the kidney from reabsorbing sodium. This increases urine flow. Diuretic treatment may also result in lowering of potassium levels in your body. Potassium supplements may be needed if you have to use diuretics daily. Daily weights can help you keep track of your progress in clearing your edema. You should call your caregiver for follow up care as recommended. SEEK IMMEDIATE MEDICAL CARE IF:   You have increased swelling, pain, redness, or heat in your legs.  You develop shortness of breath, especially when lying down.  You develop chest or abdominal pain, weakness, or fainting.  You have a fever.   This information is not intended to replace advice given to you by your health care provider. Make sure you discuss any questions you have with your health care provider.   Document Released: 03/28/2004 Document Revised: 05/13/2011 Document Reviewed:  08/31/2014 Elsevier Interactive Patient Education 2016 Elsevier Inc. Bacterial Vaginosis Bacterial vaginosis is a vaginal infection that occurs when the normal balance of bacteria in the vagina is disrupted. It results from an  overgrowth of certain bacteria. This is the most common vaginal infection in women of childbearing age. Treatment is important to prevent complications, especially in pregnant women, as it can cause a premature delivery. CAUSES  Bacterial vaginosis is caused by an increase in harmful bacteria that are normally present in smaller amounts in the vagina. Several different kinds of bacteria can cause bacterial vaginosis. However, the reason that the condition develops is not fully understood. RISK FACTORS Certain activities or behaviors can put you at an increased risk of developing bacterial vaginosis, including:  Having a new sex partner or multiple sex partners.  Douching.  Using an intrauterine device (IUD) for contraception. Women do not get bacterial vaginosis from toilet seats, bedding, swimming pools, or contact with objects around them. SIGNS AND SYMPTOMS  Some women with bacterial vaginosis have no signs or symptoms. Common symptoms include:  Grey vaginal discharge.  A fishlike odor with discharge, especially after sexual intercourse.  Itching or burning of the vagina and vulva.  Burning or pain with urination. DIAGNOSIS  Your health care provider will take a medical history and examine the vagina for signs of bacterial vaginosis. A sample of vaginal fluid may be taken. Your health care provider will look at this sample under a microscope to check for bacteria and abnormal cells. A vaginal pH test may also be done.  TREATMENT  Bacterial vaginosis may be treated with antibiotic medicines. These may be given in the form of a pill or a vaginal cream. A second round of antibiotics may be prescribed if the condition comes back after treatment. Because bacterial vaginosis increases your risk for sexually transmitted diseases, getting treated can help reduce your risk for chlamydia, gonorrhea, HIV, and herpes. HOME CARE INSTRUCTIONS   Only take over-the-counter or prescription medicines as  directed by your health care provider.  If antibiotic medicine was prescribed, take it as directed. Make sure you finish it even if you start to feel better.  Tell all sexual partners that you have a vaginal infection. They should see their health care provider and be treated if they have problems, such as a mild rash or itching.  During treatment, it is important that you follow these instructions:  Avoid sexual activity or use condoms correctly.  Do not douche.  Avoid alcohol as directed by your health care provider.  Avoid breastfeeding as directed by your health care provider. SEEK MEDICAL CARE IF:   Your symptoms are not improving after 3 days of treatment.  You have increased discharge or pain.  You have a fever. MAKE SURE YOU:   Understand these instructions.  Will watch your condition.  Will get help right away if you are not doing well or get worse. FOR MORE INFORMATION  Centers for Disease Control and Prevention, Division of STD Prevention: SolutionApps.co.za American Sexual Health Association (ASHA): www.ashastd.org    This information is not intended to replace advice given to you by your health care provider. Make sure you discuss any questions you have with your health care provider.   Document Released: 02/18/2005 Document Revised: 03/11/2014 Document Reviewed: 09/30/2012 Elsevier Interactive Patient Education [employer]

## 2015-07-25 ENCOUNTER — Encounter: Payer: Self-pay | Admitting: Family Medicine

## 2015-07-25 LAB — GC/CHLAMYDIA PROBE AMP
CT PROBE, AMP APTIMA: NOT DETECTED
GC Probe RNA: NOT DETECTED

## 2015-08-23 ENCOUNTER — Encounter (HOSPITAL_COMMUNITY): Payer: Self-pay | Admitting: Emergency Medicine

## 2015-08-23 ENCOUNTER — Telehealth: Payer: Self-pay | Admitting: Neurology

## 2015-08-23 ENCOUNTER — Emergency Department (HOSPITAL_COMMUNITY)
Admission: EM | Admit: 2015-08-23 | Discharge: 2015-08-23 | Disposition: A | Discharge status: Home / Self Care | Payer: BLUE CROSS/BLUE SHIELD | Attending: Emergency Medicine | Admitting: Emergency Medicine

## 2015-08-23 DIAGNOSIS — J45909 Unspecified asthma, uncomplicated: Secondary | ICD-10-CM | POA: Diagnosis not present

## 2015-08-23 DIAGNOSIS — G43909 Migraine, unspecified, not intractable, without status migrainosus: Secondary | ICD-10-CM | POA: Insufficient documentation

## 2015-08-23 DIAGNOSIS — Z79899 Other long term (current) drug therapy: Secondary | ICD-10-CM | POA: Diagnosis not present

## 2015-08-23 DIAGNOSIS — G43009 Migraine without aura, not intractable, without status migrainosus: Secondary | ICD-10-CM

## 2015-08-23 MED ORDER — SODIUM CHLORIDE 0.9 % IV BOLUS (SEPSIS)
1000.0000 mL | Freq: Once | INTRAVENOUS | Status: AC
  Administered 2015-08-23: 1000 mL via INTRAVENOUS

## 2015-08-23 MED ORDER — KETOROLAC TROMETHAMINE 30 MG/ML IJ SOLN
30.0000 mg | Freq: Once | INTRAMUSCULAR | Status: AC
  Administered 2015-08-23: 30 mg via INTRAVENOUS
  Filled 2015-08-23: qty 1

## 2015-08-23 MED ORDER — PROCHLORPERAZINE EDISYLATE 5 MG/ML IJ SOLN
10.0000 mg | Freq: Once | INTRAMUSCULAR | Status: AC
  Administered 2015-08-23: 10 mg via INTRAVENOUS
  Filled 2015-08-23: qty 2

## 2015-08-23 MED ORDER — OXYCODONE-ACETAMINOPHEN 5-325 MG PO TABS
ORAL_TABLET | ORAL | Status: AC
  Filled 2015-08-23: qty 1

## 2015-08-23 MED ORDER — OXYCODONE-ACETAMINOPHEN 5-325 MG PO TABS
1.0000 | ORAL_TABLET | ORAL | Status: AC | PRN
  Administered 2015-08-23 (×2): 1 via ORAL
  Filled 2015-08-23: qty 1

## 2015-08-23 MED ORDER — DIPHENHYDRAMINE HCL 50 MG/ML IJ SOLN
12.5000 mg | Freq: Once | INTRAMUSCULAR | Status: AC
Start: 2015-08-23 — End: 2015-08-23
  Administered 2015-08-23: 12.5 mg via INTRAVENOUS
  Filled 2015-08-23: qty 1

## 2015-08-23 MED ORDER — ONDANSETRON 4 MG PO TBDP
4.0000 mg | ORAL_TABLET | Freq: Once | ORAL | Status: AC
  Administered 2015-08-23: 4 mg via ORAL

## 2015-08-23 MED ORDER — ONDANSETRON 4 MG PO TBDP
ORAL_TABLET | ORAL | Status: AC
  Filled 2015-08-23: qty 1

## 2015-08-23 NOTE — Telephone Encounter (Signed)
Dr Lucia GaskinsAhern- FYI Called pt. Per Dr Lucia GaskinsAhern, she needs to go to ED. D/t her insurance, we cannot do migraine cocktail at our office. She would have to pay out of pocket.  After she is seen at ED, she can call office and set up appt with one of our NP to f/u after visit. She stated she has someone that can bring her to ED. Told her to call if she has any more questions. She verbalized understanding.

## 2015-08-23 NOTE — ED Provider Notes (Signed)
CSN: [id number]     Arrival date & time 08/23/15  1249 History   First MD Initiated Contact with Patient 08/23/15 1625     Chief Complaint  Patient presents with  . Migraine   HPI Pt started having some aura symptoms the last few days.  Today when she woke up she had more severe pain.  She is feeling nauseated.  Her head feels like it is banging.  The pain is located on the left side of face, temple and eye.  This feels like prior migraines. She vomited twice today.  No fevers.  NO numbness or weakness.  No neck pain.  No trouble with speech.  She called her doctor and was told to come to the ED.  SHe took some imitrex without relief. Past Medical History  Diagnosis Date  . Allergy   . Sleep apnea   . Anxiety   . Depression   . Asthma   . Abortion 03/09/2013  . Pregnancy induced hypertension   . Kidney stone   . Headache    Past Surgical History  Procedure Laterality Date  . Mouth surgery  1999  . Cervical cerclage      all 3 pregnancies  . Dilation and curettage of uterus  2001    retained placenta  . Dilation and curettage of uterus  2006    SAB  . Tubal ligation     Family History  Problem Relation Age of Onset  . HIV Mother   . Seizures Mother     secondary to drug abuse  . Drug abuse Mother   . Heart disease Maternal Grandfather   . Diabetes Maternal Grandfather   . Cancer Maternal Grandfather   . Diabetes Paternal Grandmother   . Cancer Paternal Grandfather   . Heart disease Maternal Grandmother   . Hypertension Maternal Grandmother   . Migraines Neg Hx    Social History  Substance Use Topics  . Smoking status: Never Smoker   . Smokeless tobacco: Never Used  . Alcohol Use: No   OB History    Gravida Para Term Preterm AB TAB SAB Ectopic Multiple Living   7 3 1 2 4 2 2  0 0 1     Review of Systems  All other systems reviewed and are negative.     Allergies  Stadol and Kale  Home Medications   Prior to Admission medications   Medication Sig Start  Date End Date Taking? Authorizing Provider  citalopram (CELEXA) 20 MG tablet Take 1 tablet (20 mg total) by mouth daily. 12/04/14  Yes Thermon LeylandLaura A Davis, NP  divalproex (DEPAKOTE ER) 250 MG 24 hr tablet Take 3 tablets (750 mg total) by mouth daily. Start with one pill at night then increase to 2 pills in 2 weeks. 05/29/15  Yes Anson FretAntonia B Ahern, MD  ibuprofen (ADVIL,MOTRIN) 800 MG tablet Take 1 tablet (800 mg total) by mouth every 8 (eight) hours as needed. No other NSAIDs, take with food. 04/01/15  Yes Thao P Le, DO  ipratropium (ATROVENT) 0.03 % nasal spray Place 2 sprays into both nostrils 4 (four) times daily. 02/17/15  Yes Peyton Najjaravid H Hopper, MD  ondansetron (ZOFRAN ODT) 4 MG disintegrating tablet Take 1 tablet (4 mg total) by mouth every 8 (eight) hours as needed for nausea or vomiting. 04/12/15  Yes Anson FretAntonia B Ahern, MD  propranolol (INDERAL) 20 MG tablet Take 20 mg by mouth daily.    Yes Historical Provider, MD  SUMAtriptan (IMITREX) 20 MG/ACT nasal  spray Place 1 spray (20 mg total) into the nose once. May repeat in 2 hours if headache persists or recurs. 04/12/15  Yes Anson Fret, MD  traZODone (DESYREL) 50 MG tablet Take 0.5 tablets (25 mg total) by mouth at bedtime as needed for sleep. 12/04/14  Yes Thermon Leyland, NP  metroNIDAZOLE (FLAGYL) 500 MG tablet Take 1 tablet (500 mg total) by mouth 2 (two) times daily. 07/24/15   Sherren Mocha, MD   BP 126/82 mmHg  Pulse 50  Temp(Src) 98.2 F (36.8 C) (Oral)  Resp 16  Ht  (1.651 m)  Wt 87.091 kg  BMI 31.95 kg/m2  SpO2 99%  LMP 05/22/2015 Physical Exam  Constitutional: She appears well-developed and well-nourished. No distress.  HENT:  Head: Normocephalic and atraumatic.  Right Ear: External ear normal.  Left Ear: External ear normal.  Eyes: Conjunctivae are normal. Right eye exhibits no discharge. Left eye exhibits no discharge. No scleral icterus.  Neck: Neck supple. No tracheal deviation present.  Cardiovascular: Normal rate, regular rhythm and  intact distal pulses.   Pulmonary/Chest: Effort normal and breath sounds normal. No stridor. No respiratory distress. She has no wheezes. She has no rales.  Abdominal: Soft. Bowel sounds are normal. She exhibits no distension. There is no tenderness. There is no rebound and no guarding.  Musculoskeletal: She exhibits no edema or tenderness.  Neurological: She is alert. She has normal strength. No cranial nerve deficit (no facial droop, extraocular movements intact, no slurred speech) or sensory deficit. She exhibits normal muscle tone. She displays no seizure activity. Coordination normal.  Skin: Skin is warm and dry. No rash noted.  Psychiatric: She has a normal mood and affect.  Nursing note and vitals reviewed.   ED Course  Procedures (including critical care time) Medications given in the ED Medications  ondansetron (ZOFRAN-ODT) 4 MG disintegrating tablet (not administered)  oxyCODONE-acetaminophen (PERCOCET/ROXICET) 5-325 MG per tablet (not administered)  oxyCODONE-acetaminophen (PERCOCET/ROXICET) 5-325 MG per tablet 1 tablet (1 tablet Oral Given 08/23/15 1650)  ondansetron (ZOFRAN-ODT) disintegrating tablet 4 mg (4 mg Oral Given 08/23/15 1304)  sodium chloride 0.9 % bolus 1,000 mL (1,000 mLs Intravenous New Bag/Given 08/23/15 1649)  prochlorperazine (COMPAZINE) injection 10 mg (10 mg Intravenous Given 08/23/15 1652)  ketorolac (TORADOL) 30 MG/ML injection 30 mg (30 mg Intravenous Given 08/23/15 1653)  diphenhydrAMINE (BENADRYL) injection 12.5 mg (12.5 mg Intravenous Given 08/23/15 1653)  sodium chloride 0.9 % bolus 1,000 mL (1,000 mLs Intravenous New Bag/Given 08/23/15 1649)      MDM   Final diagnoses:  Nonintractable migraine, unspecified migraine type    Sx consistent with a migraine headache.  She improved after treatment with a migraine cocktail.  Stable for outpatient follow up    Linwood Dibbles, MD 08/23/15 1920

## 2015-08-23 NOTE — Telephone Encounter (Signed)
Pt called in stating she woke up with migraine, throwing up, front of face hurts. She says she took Imitrix but it is not working. She wants to come in . Dr. Lucia GaskinsAhern does not have any openings. Please call and advise [phone]

## 2015-08-23 NOTE — Discharge Instructions (Signed)

## 2015-08-23 NOTE — Telephone Encounter (Signed)
I called and spoke to ryan, patient not there. Said I hope she feels better have her call the office if she needs us

## 2015-08-23 NOTE — ED Notes (Signed)
Pt reports history of migraines and woke up today with a headache and feeling nauseous. Pt called pcp and was told to come to ED for IV medications per pt.

## 2015-08-23 NOTE — Telephone Encounter (Signed)
Dr Lucia GaskinsAhern- I checked with Inetta Fermoina and she cannot do migraine infusion because her insurance will not cover infusion. Please advise

## 2015-08-30 ENCOUNTER — Ambulatory Visit: Payer: BLUE CROSS/BLUE SHIELD | Admitting: Neurology

## 2015-08-30 ENCOUNTER — Telehealth: Payer: Self-pay | Admitting: *Deleted

## 2015-08-30 NOTE — Telephone Encounter (Signed)
No showed

## 2015-08-31 ENCOUNTER — Encounter: Payer: Self-pay | Admitting: Neurology

## 2015-09-14 ENCOUNTER — Encounter (HOSPITAL_COMMUNITY): Payer: Self-pay | Admitting: Emergency Medicine

## 2015-09-14 ENCOUNTER — Emergency Department (HOSPITAL_COMMUNITY)
Admission: EM | Admit: 2015-09-14 | Discharge: 2015-09-14 | Disposition: A | Discharge status: Home / Self Care | Payer: BLUE CROSS/BLUE SHIELD | Attending: Emergency Medicine | Admitting: Emergency Medicine

## 2015-09-14 DIAGNOSIS — E860 Dehydration: Secondary | ICD-10-CM | POA: Insufficient documentation

## 2015-09-14 DIAGNOSIS — N390 Urinary tract infection, site not specified: Secondary | ICD-10-CM | POA: Insufficient documentation

## 2015-09-14 DIAGNOSIS — J45909 Unspecified asthma, uncomplicated: Secondary | ICD-10-CM | POA: Diagnosis not present

## 2015-09-14 DIAGNOSIS — R1032 Left lower quadrant pain: Secondary | ICD-10-CM | POA: Diagnosis present

## 2015-09-14 DIAGNOSIS — Z79899 Other long term (current) drug therapy: Secondary | ICD-10-CM | POA: Diagnosis not present

## 2015-09-14 LAB — COMPREHENSIVE METABOLIC PANEL
ALBUMIN: 4.4 g/dL (ref 3.5–5.0)
ALK PHOS: 58 U/L (ref 38–126)
ALT: 16 U/L (ref 14–54)
ANION GAP: 10 (ref 5–15)
AST: 17 U/L (ref 15–41)
BILIRUBIN TOTAL: 2.2 mg/dL — AB (ref 0.3–1.2)
BUN: 10 mg/dL (ref 6–20)
CALCIUM: 10.2 mg/dL (ref 8.9–10.3)
CO2: 24 mmol/L (ref 22–32)
CREATININE: 0.92 mg/dL (ref 0.44–1.00)
Chloride: 106 mmol/L (ref 101–111)
GFR calc Af Amer: >60 mL/min (ref >60)
GFR calc non Af Amer: >60 mL/min (ref >60)
GLUCOSE: 99 mg/dL (ref 65–99)
Potassium: 3.6 mmol/L (ref 3.5–5.1)
SODIUM: 140 mmol/L (ref 135–145)
TOTAL PROTEIN: 7.8 g/dL (ref 6.5–8.1)

## 2015-09-14 LAB — CBC
HCT: 42.9 % (ref 36.0–46.0)
HEMOGLOBIN: 13.7 g/dL (ref 12.0–15.0)
MCH: 27.1 pg (ref 26.0–34.0)
MCHC: 31.9 g/dL (ref 30.0–36.0)
MCV: 84.8 fL (ref 78.0–100.0)
PLATELETS: 264 10*3/uL (ref 150–400)
RBC: 5.06 MIL/uL (ref 3.87–5.11)
RDW: 13.9 % (ref 11.5–15.5)
WBC: 5.9 10*3/uL (ref 4.0–10.5)

## 2015-09-14 LAB — POC URINE PREG, ED: Preg Test, Ur: NEGATIVE

## 2015-09-14 LAB — URINALYSIS, ROUTINE W REFLEX MICROSCOPIC
Glucose, UA: NEGATIVE mg/dL
KETONES UR: 15 mg/dL — AB
NITRITE: POSITIVE — AB
Protein, ur: NEGATIVE mg/dL
Specific Gravity, Urine: 1.028 (ref 1.005–1.030)
pH: 6 (ref 5.0–8.0)

## 2015-09-14 LAB — LIPASE, BLOOD: Lipase: 24 U/L (ref 11–51)

## 2015-09-14 LAB — URINE MICROSCOPIC-ADD ON

## 2015-09-14 MED ORDER — ONDANSETRON HCL 4 MG/2ML IJ SOLN
4.0000 mg | Freq: Once | INTRAMUSCULAR | Status: AC
  Administered 2015-09-14: 4 mg via INTRAVENOUS
  Filled 2015-09-14: qty 2

## 2015-09-14 MED ORDER — SODIUM CHLORIDE 0.9 % IV BOLUS (SEPSIS)
2000.0000 mL | Freq: Once | INTRAVENOUS | Status: AC
Start: 2015-09-14 — End: 2015-09-14
  Administered 2015-09-14: 2000 mL via INTRAVENOUS

## 2015-09-14 MED ORDER — NITROFURANTOIN MONOHYD MACRO 100 MG PO CAPS: 100.0000 mg | ORAL_CAPSULE | Freq: Two times a day (BID) | ORAL | Status: DC

## 2015-09-14 NOTE — ED Notes (Signed)
Pt able to keep down water.  Denies and nausea or vomiting.

## 2015-09-14 NOTE — ED Notes (Signed)
Pt here with emesis starting 3 days ago. Pt sts she is unable to keep anything down. Pt also reports lower abdominal cramping starting today. Last BM yesterday. Pt denies fever.

## 2015-09-14 NOTE — ED Notes (Signed)
MD at bedside. 

## 2015-09-14 NOTE — ED Provider Notes (Addendum)
CSN: [id number]     Arrival date & time 09/14/15  1334 History   First MD Initiated Contact with Patient 09/14/15 1540     Chief Complaint  Patient presents with  . Emesis  . Abdominal Pain     (Consider location/radiation/quality/duration/timing/severity/associated sxs/prior Treatment) HPI Comments: Patient is a 40 year old female with a history of anxiety, depression, asthma, pregnancy-induced hypertension, migraines and kidney stone who is presenting today with 3 days of nausea and vomiting. Patient states that the nausea and vomiting started after she took Flagyl for BV. She had seen her OB/GYN and was found to have a yeast infection and bacterial vaginosis. She did not mix alcohol with Flagyl but states that she has not felt well for the last 3 days. Her yeast infection and vaginal discharge has completely resolved. She did have enlarged lymph nodes in her pelvic area which have also resolved. She has some mild intermittent abdominal pain but nothing persistent in the same location. She had diarrhea last week but that has since resolved. 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  She states she is just stop eating and drinking because then she will not vomit as much. She is concerned that she is dehydrated and is starting to get a headache but denies that is like a migraine.  Patient is a 40 y.o. female presenting with vomiting and abdominal pain. The history is provided by the patient.  Emesis Severity:  Moderate Duration:  3 days Timing:  Constant Number of daily episodes:  3-4 Quality:  Stomach contents Progression:  Unchanged Chronicity:  New Recent urination:  Decreased Relieved by:  Nothing Worsened by:  Food smell and liquids Ineffective treatments:  None tried Associated symptoms: abdominal pain and headaches   Associated symptoms: no cough, no diarrhea, no fever and no URI   Risk factors: no alcohol use, no diabetes, not pregnant now, no prior  abdominal surgery, no suspect food intake and no travel to endemic areas   Abdominal Pain Associated symptoms: vomiting   Associated symptoms: no diarrhea     Past Medical History  Diagnosis Date  . Allergy   . Sleep apnea   . Anxiety   . Depression   . Asthma   . Abortion 03/09/2013  . Pregnancy induced hypertension   . Kidney stone   . Headache    Past Surgical History  Procedure Laterality Date  . Mouth surgery  1999  . Cervical cerclage      all 3 pregnancies  . Dilation and curettage of uterus  2001    retained placenta  . Dilation and curettage of uterus  2006    SAB  . Tubal ligation     Family History  Problem Relation Age of Onset  . HIV Mother   . Seizures Mother     secondary to drug abuse  . Drug abuse Mother   . Heart disease Maternal Grandfather   . Diabetes Maternal Grandfather   . Cancer Maternal Grandfather   . Diabetes Paternal Grandmother   . Cancer Paternal Grandfather   . Heart disease Maternal Grandmother   . Hypertension Maternal Grandmother   . Migraines Neg Hx    Social History  Substance Use Topics  . Smoking status: Never Smoker   . Smokeless tobacco: Never Used  . Alcohol Use: No   OB History    Gravida Para Term Preterm AB TAB SAB Ectopic Multiple Living   7 3 1  2  $RemoveBefor  Review of Systems  Gastrointestinal: Positive for vomiting and abdominal pain. Negative for diarrhea.  Neurological: Positive for headaches.  All other systems reviewed and are negative.     Allergies  Stadol and Kale  Home Medications   Prior to Admission medications   Medication Sig Start Date End Date Taking? Authorizing Provider  citalopram (CELEXA) 20 MG tablet Take 1 tablet (20 mg total) by mouth daily. 12/04/14   Thermon Leyland, NP  divalproex (DEPAKOTE ER) 250 MG 24 hr tablet Take 3 tablets (750 mg total) by mouth daily. Start with one pill at night then increase to 2 pills in 2 weeks. 05/29/15   Anson Fret, MD  ibuprofen  (ADVIL,MOTRIN) 800 MG tablet Take 1 tablet (800 mg total) by mouth every 8 (eight) hours as needed. No other NSAIDs, take with food. 04/01/15   Thao P Le, DO  ipratropium (ATROVENT) 0.03 % nasal spray Place 2 sprays into both nostrils 4 (four) times daily. 02/17/15   Peyton Najjar, MD  metroNIDAZOLE (FLAGYL) 500 MG tablet Take 1 tablet (500 mg total) by mouth 2 (two) times daily. 07/24/15   Sherren Mocha, MD  ondansetron (ZOFRAN ODT) 4 MG disintegrating tablet Take 1 tablet (4 mg total) by mouth every 8 (eight) hours as needed for nausea or vomiting. 04/12/15   Anson Fret, MD  propranolol (INDERAL) 20 MG tablet Take 20 mg by mouth daily.     Historical Provider, MD  SUMAtriptan (IMITREX) 20 MG/ACT nasal spray Place 1 spray (20 mg total) into the nose once. May repeat in 2 hours if headache persists or recurs. 04/12/15   Anson Fret, MD  traZODone (DESYREL) 50 MG tablet Take 0.5 tablets (25 mg total) by mouth at bedtime as needed for sleep. 12/04/14   Thermon Leyland, NP   BP 127/90 mmHg  Pulse 66  Temp(Src) 98.3 F (36.8 C) (Oral)  Resp 16  SpO2 100%  LMP 08/30/2015 Physical Exam  Constitutional: She is oriented to person, place, and time. She appears well-developed and well-nourished. No distress.  HENT:  Head: Normocephalic and atraumatic.  Mouth/Throat: Oropharynx is clear and moist.  Eyes: Conjunctivae and EOM are normal. Pupils are equal, round, and reactive to light.  Neck: Normal range of motion. Neck supple.  Cardiovascular: Normal rate, regular rhythm and intact distal pulses.   No murmur heard. Pulmonary/Chest: Effort normal and breath sounds normal. No respiratory distress. She has no wheezes. She has no rales.  Abdominal: Soft. She exhibits no distension. There is tenderness in the left lower quadrant. There is no rebound and no guarding.    Genitourinary:  No pelvic lymph nodes present at this time  Musculoskeletal: Normal range of motion. She exhibits no edema or tenderness.   Neurological: She is alert and oriented to person, place, and time.  Skin: Skin is warm and dry. No rash noted. No erythema.  Psychiatric: She has a normal mood and affect. Her behavior is normal.  Nursing note and vitals reviewed.   ED Course  Procedures (including critical care time) Labs Review Labs Reviewed  COMPREHENSIVE METABOLIC PANEL - Abnormal; Notable for the following:    Total Bilirubin 2.2 (*)    All other components within normal limits  URINALYSIS, ROUTINE W REFLEX MICROSCOPIC (NOT AT [facility]) - Abnormal; Notable for the following:    Color, Urine AMBER (*)    APPearance CLOUDY (*)    Hgb urine  dipstick MODERATE (*)    Bilirubin Urine SMALL (*)    Ketones, ur 15 (*)    Nitrite POSITIVE (*)    Leukocytes, UA SMALL (*)    All other components within normal limits  URINE MICROSCOPIC-ADD ON - Abnormal; Notable for the following:    Squamous Epithelial / LPF 0-5 (*)    Bacteria, UA MANY (*)    All other components within normal limits  LIPASE, BLOOD  CBC  POC URINE PREG, ED    Imaging Review No results found. I have personally reviewed and evaluated these images and lab results as part of my medical decision-making.   EKG Interpretation None      MDM   Final diagnoses:  UTI (lower urinary tract infection)  Dehydration    Patient is presenting with 3 days of nausea and vomiting that is not improving. She states it started after taking Flagyl and Diflucan for a yeast and BV infection. Because she had swollen pelvic lymph nodes her doctor also gave her a prescription for amoxicillin but she decided not to take it because she was already starting to feel sick. She had diarrhea last week but that has resolved and she just has nausea and vomiting. She denies any fever and she has no significant abdominal tenderness on exam and no peritoneal signs. Patient states her vaginal discharge has completely resolved.  Patient's nausea and vomiting could be a result of having  recent antibiotics or could be viral in nature. She has no abdominal findings concerning for cholecystitis, pancreatitis or appendicitis. Patient has no reproducible pelvic tenderness and low suspicion for ovarian torsion, TOA or ovarian cyst. Patient has an appointment with her GYN tomorrow for repeat exam and evaluation and at this time prefers not to have that exam done again. Patient had blood work prior to being seen which showed a normal CBC, CMP and lipase. She denies any alcohol use or drug use.  We will give IV fluids and Zofran. We'll reevaluate. UA and UPT pending  7:55 PM Patient is feeling much better. UA concerning for UTI and UPT negative. Patient treated with Macrobid and will follow-up with her GYN tomorrow  Gwyneth Sprout, MD 09/14/15 1955  Gwyneth Sprout, MD 09/14/15 1610

## 2015-09-14 NOTE — ED Notes (Signed)
Patient able to ambulate independently  

## 2015-09-17 LAB — URINE CULTURE

## 2015-09-18 ENCOUNTER — Telehealth (HOSPITAL_BASED_OUTPATIENT_CLINIC_OR_DEPARTMENT_OTHER): Payer: Self-pay

## 2015-09-18 NOTE — Telephone Encounter (Signed)
Post ED Visit - Positive Culture Follow-up  Culture report reviewed by antimicrobial stewardship pharmacist:  []  Enzo BiNathan Batchelder, Pharm.D. []  Celedonio MiyamotoJeremy Frens, [address]Ph[address] BCPS []  Garvin FilaMike Maccia, Pharm.D. []  Georgina PillionElizabeth Martin, Pharm.D., BCPS []  ClintonMinh Pham, [address]Ph[address]D., BCPS, AAHIVP [x]  Estella HuskMichelle Turner, Pharm.D., BCPS, AAHIVP []  Tennis Mustassie Stewart, Pharm.D. []  Rob Oswaldo DoneVincent, [address]Ph[address]  Positive urine culture Treated with Nitrofurantoin, organism sensitive to the same and no further patient follow-up is required at this time.  Jerry CarasCullom, Zakiyyah Savannah Burnett 09/18/2015, 10:33 AM

## 2015-10-08 ENCOUNTER — Encounter (HOSPITAL_COMMUNITY): Payer: Self-pay | Admitting: *Deleted

## 2015-10-08 ENCOUNTER — Emergency Department (HOSPITAL_COMMUNITY)
Admission: EM | Admit: 2015-10-08 | Discharge: 2015-10-09 | Disposition: A | Discharge status: Home / Self Care | Payer: BLUE CROSS/BLUE SHIELD | Attending: Emergency Medicine | Admitting: Emergency Medicine

## 2015-10-08 DIAGNOSIS — J45909 Unspecified asthma, uncomplicated: Secondary | ICD-10-CM | POA: Diagnosis not present

## 2015-10-08 DIAGNOSIS — G43809 Other migraine, not intractable, without status migrainosus: Secondary | ICD-10-CM | POA: Diagnosis not present

## 2015-10-08 DIAGNOSIS — R51 Headache: Secondary | ICD-10-CM | POA: Diagnosis present

## 2015-10-08 DIAGNOSIS — R519 Headache, unspecified: Secondary | ICD-10-CM

## 2015-10-08 LAB — CBC WITH DIFFERENTIAL/PLATELET
Basophils Absolute: 0 10*3/uL (ref 0.0–0.1)
Basophils Relative: 0 %
EOS PCT: 1 %
Eosinophils Absolute: 0.1 10*3/uL (ref 0.0–0.7)
HCT: 40.6 % (ref 36.0–46.0)
Hemoglobin: 12.7 g/dL (ref 12.0–15.0)
LYMPHS ABS: 2.4 10*3/uL (ref 0.7–4.0)
LYMPHS PCT: 44 %
MCH: 27.2 pg (ref 26.0–34.0)
MCHC: 31.3 g/dL (ref 30.0–36.0)
MCV: 86.9 fL (ref 78.0–100.0)
MONO ABS: 0.5 10*3/uL (ref 0.1–1.0)
MONOS PCT: 9 %
Neutro Abs: 2.6 10*3/uL (ref 1.7–7.7)
Neutrophils Relative %: 46 %
PLATELETS: 221 10*3/uL (ref 150–400)
RBC: 4.67 MIL/uL (ref 3.87–5.11)
RDW: 13.9 % (ref 11.5–15.5)
WBC: 5.6 10*3/uL (ref 4.0–10.5)

## 2015-10-08 LAB — I-STAT BETA HCG BLOOD, ED (MC, WL, AP ONLY)

## 2015-10-08 LAB — COMPREHENSIVE METABOLIC PANEL
ALT: 15 U/L (ref 14–54)
AST: 16 U/L (ref 15–41)
Albumin: 3.9 g/dL (ref 3.5–5.0)
Alkaline Phosphatase: 58 U/L (ref 38–126)
Anion gap: 6 (ref 5–15)
BUN: 10 mg/dL (ref 6–20)
CHLORIDE: 106 mmol/L (ref 101–111)
CO2: 24 mmol/L (ref 22–32)
CREATININE: 0.83 mg/dL (ref 0.44–1.00)
Calcium: 9.3 mg/dL (ref 8.9–10.3)
Glucose, Bld: 92 mg/dL (ref 65–99)
POTASSIUM: 3.7 mmol/L (ref 3.5–5.1)
Sodium: 136 mmol/L (ref 135–145)
TOTAL PROTEIN: 6.9 g/dL (ref 6.5–8.1)
Total Bilirubin: 0.9 mg/dL (ref 0.3–1.2)

## 2015-10-08 MED ORDER — ONDANSETRON 4 MG PO TBDP
ORAL_TABLET | ORAL | Status: AC
  Filled 2015-10-08: qty 1

## 2015-10-08 MED ORDER — SODIUM CHLORIDE 0.9 % IV BOLUS (SEPSIS)
1000.0000 mL | Freq: Once | INTRAVENOUS | Status: AC
  Administered 2015-10-08: 1000 mL via INTRAVENOUS

## 2015-10-08 MED ORDER — METOCLOPRAMIDE HCL 5 MG/ML IJ SOLN
10.0000 mg | Freq: Once | INTRAMUSCULAR | Status: AC
  Administered 2015-10-08: 10 mg via INTRAVENOUS
  Filled 2015-10-08: qty 2

## 2015-10-08 MED ORDER — DIPHENHYDRAMINE HCL 50 MG/ML IJ SOLN
25.0000 mg | Freq: Once | INTRAMUSCULAR | Status: AC
  Administered 2015-10-08: 25 mg via INTRAVENOUS
  Filled 2015-10-08: qty 1

## 2015-10-08 MED ORDER — KETOROLAC TROMETHAMINE 30 MG/ML IJ SOLN
30.0000 mg | Freq: Once | INTRAMUSCULAR | Status: AC
  Administered 2015-10-08: 30 mg via INTRAVENOUS
  Filled 2015-10-08: qty 1

## 2015-10-08 MED ORDER — ONDANSETRON 4 MG PO TBDP
4.0000 mg | ORAL_TABLET | Freq: Once | ORAL | Status: AC
  Administered 2015-10-08: 4 mg via ORAL

## 2015-10-08 NOTE — ED Triage Notes (Signed)
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

## 2015-10-08 NOTE — Discharge Instructions (Signed)
Follow-up with your primary care physician.  You've also been given a referral to the [facility] for evaluation

## 2015-10-08 NOTE — ED Provider Notes (Signed)
[facility]-EMERGENCY DEPT Provider Note   [name] Arrival date & time: 10/08/15  2015  First Provider Contact:  First MD Initiated Contact with Patient 10/08/15 2154        History   Chief Complaint Chief Complaint  Patient presents with  . Migraine    HPI Morgan [name] is a 40 y.o. female.  This a 40 year old female with a history of chronic migraine headaches who states for the past 3 days.  She's had a frontal headache on the right which is consistent with her previous headaches.  She has taken medication without any relief.  She reports nausea without vomiting, no visual disturbances She states that during her menstrual cycle.  She always gets a worse headache she just ended her cycle yesterday      Past Medical History:  Diagnosis Date  . Abortion 03/09/2013  . Allergy   . Anxiety   . Asthma   . Depression   . Headache   . Kidney stone   . Pregnancy induced hypertension   . Sleep apnea     Patient Active Problem List   Diagnosis Date Noted  . Chronic migraine w/o aura w/o status migrainosus, not intractable 05/31/2015  . Major depressive disorder, recurrent, severe without psychotic features (HCC)   . MDD (major depressive disorder), recurrent episode, severe (HCC) 11/30/2014  . Ureterolithiasis 04/19/2014  . Migraine 03/18/2013  . OSA on CPAP 03/18/2013    Past Surgical History:  Procedure Laterality Date  . CERVICAL CERCLAGE     all 3 pregnancies  . DILATION AND CURETTAGE OF UTERUS  2001   retained placenta  . DILATION AND CURETTAGE OF UTERUS  2006   SAB  . MOUTH SURGERY  1999  . TUBAL LIGATION      OB History    Gravida Para Term Preterm AB Living   7 3 1 2 4 1    SAB TAB Ectopic Multiple Live Births   2 2 0 0 3       Home Medications    Prior to Admission medications   Medication Sig Start Date End Date Taking? Authorizing Provider  citalopram (CELEXA) 20 MG tablet Take 1 tablet (20 mg total) by mouth daily. 12/04/14  Yes Thermon LeylandLaura A  Davis, NP  divalproex (DEPAKOTE ER) 250 MG 24 hr tablet Take 3 tablets (750 mg total) by mouth daily. Start with one pill at night then increase to 2 pills in 2 weeks. 05/29/15  Yes Anson FretAntonia B Ahern, MD  ibuprofen (ADVIL,MOTRIN) 800 MG tablet Take 1 tablet (800 mg total) by mouth every 8 (eight) hours as needed. No other NSAIDs, take with food. 04/01/15  Yes Thao P Le, DO  ipratropium (ATROVENT) 0.03 % nasal spray Place 2 sprays into both nostrils 4 (four) times daily. Patient taking differently: Place 2 sprays into both nostrils 2 (two) times daily as needed for rhinitis.  02/17/15  Yes Peyton Najjaravid H Hopper, MD  ondansetron (ZOFRAN ODT) 4 MG disintegrating tablet Take 1 tablet (4 mg total) by mouth every 8 (eight) hours as needed for nausea or vomiting. 04/12/15  Yes Anson FretAntonia B Ahern, MD  propranolol (INDERAL) 20 MG tablet Take 20 mg by mouth daily.    Yes Historical Provider, MD  SUMAtriptan (IMITREX) 20 MG/ACT nasal spray Place 1 spray (20 mg total) into the nose once. May repeat in 2 hours if headache persists or recurs. 04/12/15  Yes Anson FretAntonia B Ahern, MD  traZODone (DESYREL) 50 MG tablet Take 0.5 tablets (25 mg total)  by mouth at bedtime as needed for sleep. 12/04/14  Yes Thermon Leyland, NP    Family History Family History  Problem Relation Age of Onset  . HIV Mother   . Seizures Mother     secondary to drug abuse  . Drug abuse Mother   . Heart disease Maternal Grandfather   . Diabetes Maternal Grandfather   . Cancer Maternal Grandfather   . Diabetes Paternal Grandmother   . Cancer Paternal Grandfather   . Heart disease Maternal Grandmother   . Hypertension Maternal Grandmother   . Migraines Neg Hx     Social History Social History  Substance Use Topics  . Smoking status: Never Smoker  . Smokeless tobacco: Never Used  . Alcohol use No     Allergies   Stadol [butorphanol tartrate] and Kale   Review of Systems Review of Systems  Constitutional: Negative for chills and fever.  Eyes:  Negative for visual disturbance.  Gastrointestinal: Positive for nausea. Negative for vomiting.  Neurological: Positive for headaches. Negative for dizziness.  All other systems reviewed and are negative.    Physical Exam Updated Vital Signs BP 102/75   Pulse (!) 47   Temp 98.7 F (37.1 C) (Oral)   Resp 18   Ht  (1.651 m)   Wt 84.4 kg   LMP 10/02/2015 (Exact Date)   SpO2 100%   BMI 30.95 kg/m   Physical Exam  Constitutional: She is oriented to person, place, and time. She appears well-developed and well-nourished.  HENT:  Head: Normocephalic.  Right Ear: External ear normal.  Left Ear: External ear normal.  Mouth/Throat: Oropharynx is clear and moist.  Eyes: Pupils are equal, round, and reactive to light.  Neck: Normal range of motion.  Cardiovascular: Normal rate.   Pulmonary/Chest: Effort normal.  Abdominal: Soft.  Musculoskeletal: Normal range of motion.  Neurological: She is alert and oriented to person, place, and time.  Skin: Skin is warm and dry. No rash noted.  Psychiatric: She has a normal mood and affect.  Nursing note and vitals reviewed.    ED Treatments / Results  Labs (all labs ordered are listed, but only abnormal results are displayed) Labs Reviewed  CBC WITH DIFFERENTIAL/PLATELET  COMPREHENSIVE METABOLIC PANEL  I-STAT BETA HCG BLOOD, ED ([facility], [facility], [facility] ONLY)    EKG  EKG Interpretation None       Radiology No results found.  Procedures Procedures (including critical care time)  Medications Ordered in ED Medications  ondansetron (ZOFRAN-ODT) 4 MG disintegrating tablet (not administered)  ondansetron (ZOFRAN-ODT) disintegrating tablet 4 mg (4 mg Oral Given 10/08/15 2031)  sodium chloride 0.9 % bolus 1,000 mL (0 mLs Intravenous Stopped 10/08/15 2316)  metoCLOPramide (REGLAN) injection 10 mg (10 mg Intravenous Given 10/08/15 2203)  diphenhydrAMINE (BENADRYL) injection 25 mg (25 mg Intravenous Given 10/08/15 2204)  ketorolac (TORADOL) 30 MG/ML  injection 30 mg (30 mg Intravenous Given 10/08/15 2203)     Initial Impression / Assessment and Plan / ED Course  I have reviewed the triage vital signs and the nursing notes.  Pertinent labs & imaging results that were available during my care of the patient were reviewed by me and considered in my medical decision making (see chart for details).  Clinical Course     Patient was given 1 L of IV saline along with 4 mg of Zofran, 10 mg of Reglan, 25 mg of Benadryl, and 30 mg of Toradol with total resolution of her headache.  Final Clinical Impressions(s) /  ED Diagnoses   Final diagnoses:  None    New Prescriptions New Prescriptions   No medications on file     Earley Favor, NP 10/08/15 2325    Mancel Bale, MD 10/08/15 2351

## 2015-10-09 NOTE — ED Notes (Signed)
Pt departed in NAD, refused use of wheelchair.  

## 2015-10-18 ENCOUNTER — Emergency Department (HOSPITAL_COMMUNITY)
Admission: EM | Admit: 2015-10-18 | Discharge: 2015-10-18 | Disposition: A | Discharge status: Home / Self Care | Payer: BLUE CROSS/BLUE SHIELD | Attending: Physician Assistant | Admitting: Physician Assistant

## 2015-10-18 ENCOUNTER — Encounter (HOSPITAL_COMMUNITY): Payer: Self-pay | Admitting: *Deleted

## 2015-10-18 DIAGNOSIS — G43909 Migraine, unspecified, not intractable, without status migrainosus: Secondary | ICD-10-CM | POA: Insufficient documentation

## 2015-10-18 DIAGNOSIS — J45909 Unspecified asthma, uncomplicated: Secondary | ICD-10-CM | POA: Insufficient documentation

## 2015-10-18 DIAGNOSIS — G43009 Migraine without aura, not intractable, without status migrainosus: Secondary | ICD-10-CM

## 2015-10-18 HISTORY — DX: Migraine, unspecified, not intractable, without status migrainosus: G43.909

## 2015-10-18 MED ORDER — ONDANSETRON 4 MG PO TBDP
ORAL_TABLET | ORAL | Status: AC
  Administered 2015-10-18: 4 mg via ORAL
  Filled 2015-10-18: qty 1

## 2015-10-18 MED ORDER — METOCLOPRAMIDE HCL 5 MG/ML IJ SOLN
10.0000 mg | Freq: Once | INTRAMUSCULAR | Status: AC
  Administered 2015-10-18: 10 mg via INTRAVENOUS
  Filled 2015-10-18: qty 2

## 2015-10-18 MED ORDER — SODIUM CHLORIDE 0.9 % IV SOLN
INTRAVENOUS | Status: AC
  Administered 2015-10-18: 13:00:00 via INTRAVENOUS

## 2015-10-18 MED ORDER — ONDANSETRON 4 MG PO TBDP
4.0000 mg | ORAL_TABLET | Freq: Once | ORAL | Status: AC
  Administered 2015-10-18: 4 mg via ORAL
  Filled 2015-10-18: qty 1

## 2015-10-18 MED ORDER — DIPHENHYDRAMINE HCL 50 MG/ML IJ SOLN
25.0000 mg | Freq: Once | INTRAMUSCULAR | Status: AC
  Administered 2015-10-18: 25 mg via INTRAVENOUS
  Filled 2015-10-18: qty 1

## 2015-10-18 MED ORDER — DEXAMETHASONE SODIUM PHOSPHATE 10 MG/ML IJ SOLN
10.0000 mg | Freq: Once | INTRAMUSCULAR | Status: AC
  Administered 2015-10-18: 10 mg via INTRAVENOUS
  Filled 2015-10-18: qty 1

## 2015-10-18 NOTE — ED Provider Notes (Signed)
[facility]EM[facility]ENCY DEPT Provider Note   [name] Arrival date & time: 10/18/15  1016     History   Chief Complaint No chief complaint on file.   HPI Morgan [name] is a 40 y.o. female who presents to the ED with a headache that started 2 days ago. She reports hx of migraines and this is similar. She does have medication for her migraines and has had good success with the medication. However, this time the medication has not worked. The headache is located on the left side of the head at the temporal area and around the left eye. She describes the pain as throbbing and rates the pain as 10/10. She has had associated symptoms of nausea and vomiting, photophobia and blurry vision at times. She denies focal weakness, syncope, fever or neck pain.   The history is provided by the patient. No language interpreter was used.  Migraine  This is a new problem. Associated symptoms include headaches. Pertinent negatives include no chest pain, no abdominal pain and no shortness of breath.    Past Medical History:  Diagnosis Date  . Abortion 03/09/2013  . Allergy   . Anxiety   . Asthma   . Depression   . Headache   . Kidney stone   . Migraines   . Pregnancy induced hypertension   . Sleep apnea     Patient Active Problem List   Diagnosis Date Noted  . Chronic migraine w/o aura w/o status migrainosus, not intractable 05/31/2015  . Major depressive disorder, recurrent, severe without psychotic features (HCC)   . MDD (major depressive disorder), recurrent episode, severe (HCC) 11/30/2014  . Ureterolithiasis 04/19/2014  . Migraine 03/18/2013  . OSA on CPAP 03/18/2013    Past Surgical History:  Procedure Laterality Date  . CERVICAL CERCLAGE     all 3 pregnancies  . DILATION AND CURETTAGE OF UTERUS  2001   retained placenta  . DILATION AND CURETTAGE OF UTERUS  2006   SAB  . MOUTH SURGERY  1999  . TUBAL LIGATION      OB History    Gravida Para Term Preterm AB Living   7 3 1  2 4 1    SAB TAB Ectopic Multiple Live Births   2 2 0 0 3       Home Medications    Prior to Admission medications   Medication Sig Start Date End Date Taking? Authorizing Provider  citalopram (CELEXA) 20 MG tablet Take 1 tablet (20 mg total) by mouth daily. 12/04/14  Yes Thermon Leyland, NP  divalproex (DEPAKOTE ER) 250 MG 24 hr tablet Take 3 tablets (750 mg total) by mouth daily. Start with one pill at night then increase to 2 pills in 2 weeks. 05/29/15  Yes Anson Fret, MD  ibuprofen (ADVIL,MOTRIN) 800 MG tablet Take 1 tablet (800 mg total) by mouth every 8 (eight) hours as needed. No other NSAIDs, take with food. Patient taking differently: Take 800 mg by mouth every 8 (eight) hours as needed for headache or moderate pain. No other NSAIDs, take with food. 04/01/15  Yes Thao P Le, DO  ipratropium (ATROVENT) 0.03 % nasal spray Place 2 sprays into both nostrils 4 (four) times daily. Patient taking differently: Place 2 sprays into both nostrils 2 (two) times daily as needed for rhinitis.  02/17/15  Yes Peyton Najjar, MD  ondansetron (ZOFRAN ODT) 4 MG disintegrating tablet Take 1 tablet (4 mg total) by mouth every 8 (eight) hours as needed for  nausea or vomiting. 04/12/15  Yes Anson FretAntonia B Ahern, MD  propranolol (INDERAL) 20 MG tablet Take 20 mg by mouth daily.    Yes Historical Provider, MD  SUMAtriptan (IMITREX) 20 MG/ACT nasal spray Place 1 spray (20 mg total) into the nose once. May repeat in 2 hours if headache persists or recurs. 04/12/15  Yes Anson FretAntonia B Ahern, MD  traZODone (DESYREL) 50 MG tablet Take 0.5 tablets (25 mg total) by mouth at bedtime as needed for sleep. 12/04/14  Yes Thermon LeylandLaura A Davis, NP    Family History Family History  Problem Relation Age of Onset  . HIV Mother   . Seizures Mother     secondary to drug abuse  . Drug abuse Mother   . Heart disease Maternal Grandfather   . Diabetes Maternal Grandfather   . Cancer Maternal Grandfather   . Diabetes Paternal Grandmother   .  Cancer Paternal Grandfather   . Heart disease Maternal Grandmother   . Hypertension Maternal Grandmother   . Migraines Neg Hx     Social History Social History  Substance Use Topics  . Smoking status: Never Smoker  . Smokeless tobacco: Never Used  . Alcohol use No     Allergies   Stadol [butorphanol tartrate] and Kale   Review of Systems Review of Systems  Constitutional: Negative for chills and fever.  Eyes: Positive for photophobia and visual disturbance.  Respiratory: Negative for chest tightness and shortness of breath.   Cardiovascular: Negative for chest pain and leg swelling.  Gastrointestinal: Positive for nausea and vomiting. Negative for abdominal pain.  Genitourinary: Negative for decreased urine volume.  Musculoskeletal: Negative for back pain and neck stiffness.  Neurological: Positive for headaches. Negative for syncope.  Psychiatric/Behavioral: Negative for confusion. The patient is not nervous/anxious.      Physical Exam Updated Vital Signs BP 114/75 (BP Location: Left Arm)   Pulse (!) 55   Temp 97.4 F (36.3 C) (Oral)   Resp 12   Ht 5\' 5"  (1.651 m)   Wt 84.4 kg   LMP 10/02/2015 (Exact Date)   SpO2 100%   BMI 30.95 kg/m   Physical Exam  Constitutional: She is oriented to person, place, and time. She appears well-developed and well-nourished. No distress.  HENT:  Head: Normocephalic and atraumatic.  Right Ear: Tympanic membrane normal.  Left Ear: Tympanic membrane normal.  Nose: Nose normal.  Mouth/Throat: Uvula is midline, oropharynx is clear and moist and mucous membranes are normal.  Eyes: Conjunctivae and EOM are normal.  Neck: Normal range of motion. Neck supple.  Cardiovascular: Normal rate, regular rhythm and intact distal pulses.   Pulmonary/Chest: Effort normal. No respiratory distress. She has no wheezes. She has no rales.  Abdominal: Soft. Bowel sounds are normal. She exhibits no mass. There is no tenderness.  Musculoskeletal: She  exhibits no edema.  Radial and pedal pulses strong, adequate circulation, good touch sensation.  Neurological: She is alert and oriented to person, place, and time. She has normal strength. No cranial nerve deficit or sensory deficit. She displays a negative Romberg sign. Gait normal.  Reflex Scores:      Bicep reflexes are 2+ on the right side and 2+ on the left side.      Brachioradialis reflexes are 2+ on the right side and 2+ on the left side.      Patellar reflexes are 2+ on the right side and 2+ on the left side.      Achilles reflexes are 2+ on the  right side and 2+ on the left side. Rapid alternating movement without difficulty. Stands on one foot without difficulty.  Psychiatric: She has a normal mood and affect. Her behavior is normal.     ED Treatments / Results: IV normal saline, Decadron 10 mg, Benadryl 25 mg, Reglan 10 mg IV   Labs (all labs ordered are listed, but only abnormal results are displayed) Labs Reviewed - No data to display  Radiology No results found.  Procedures Procedures (including critical care time)  Medications Ordered in ED Medications  0.9 %  sodium chloride infusion ( Intravenous Stopped 10/18/15 1501)  ondansetron (ZOFRAN-ODT) disintegrating tablet 4 mg (4 mg Oral Given by Other 10/18/15 1251)  metoCLOPramide (REGLAN) injection 10 mg (10 mg Intravenous Given 10/18/15 1256)  diphenhydrAMINE (BENADRYL) injection 25 mg (25 mg Intravenous Given 10/18/15 1256)  dexamethasone (DECADRON) injection 10 mg (10 mg Intravenous Given 10/18/15 1257)     Initial Impression / Assessment and Plan / ED Course  I have reviewed the triage vital signs and the nursing notes.  Pertinent labs & imaging results that were available during my care of the patient were reviewed by me and considered in my medical decision making (see chart for details).  Clinical Course  1315 after IV fluids and medications patient states she is feeling better and ready to try PO fluids.    Final Clinical Impressions(s) / ED Diagnoses  40 y.o. female with headache that is similar to her previous migraines and resolved with treatment here today stable for d/c without neuro deficits. She will f/u with her PCP. She will return here as needed.   Final diagnoses:  Migraine without aura and without status migrainosus, not intractable    New Prescriptions Discharge Medication List as of 10/18/2015  1:59 PM       Janne NapoleonHope M Neese, NP 10/18/15 1646    Courteney Lyn Mackuen, MD 10/18/15 1739

## 2015-10-18 NOTE — ED Triage Notes (Signed)
Pt c/o migraine, n/v & blurred vision onset 2 days, pt c/o sensitivity to light & smells, pt A&O x4, ambulatory, hx of migraines, pt takes Imitrex for HAs & Depakote with no relief today

## 2016-01-28 ENCOUNTER — Emergency Department (HOSPITAL_COMMUNITY)
Admission: EM | Admit: 2016-01-28 | Discharge: 2016-01-28 | Disposition: A | Discharge status: Home / Self Care | Payer: BLUE CROSS/BLUE SHIELD | Attending: Emergency Medicine | Admitting: Emergency Medicine

## 2016-01-28 ENCOUNTER — Encounter (HOSPITAL_COMMUNITY): Payer: Self-pay

## 2016-01-28 DIAGNOSIS — J45909 Unspecified asthma, uncomplicated: Secondary | ICD-10-CM | POA: Insufficient documentation

## 2016-01-28 DIAGNOSIS — G43809 Other migraine, not intractable, without status migrainosus: Secondary | ICD-10-CM | POA: Diagnosis not present

## 2016-01-28 DIAGNOSIS — G43909 Migraine, unspecified, not intractable, without status migrainosus: Secondary | ICD-10-CM | POA: Diagnosis present

## 2016-01-28 MED ORDER — DIPHENHYDRAMINE HCL 50 MG/ML IJ SOLN
25.0000 mg | Freq: Once | INTRAMUSCULAR | Status: AC
  Administered 2016-01-28: 25 mg via INTRAVENOUS
  Filled 2016-01-28: qty 1

## 2016-01-28 MED ORDER — PROCHLORPERAZINE EDISYLATE 5 MG/ML IJ SOLN
10.0000 mg | Freq: Once | INTRAMUSCULAR | Status: AC
  Administered 2016-01-28: 10 mg via INTRAVENOUS
  Filled 2016-01-28: qty 2

## 2016-01-28 MED ORDER — KETOROLAC TROMETHAMINE 60 MG/2ML IM SOLN
15.0000 mg | Freq: Once | INTRAMUSCULAR | Status: AC
  Administered 2016-01-28: 15 mg via INTRAMUSCULAR
  Filled 2016-01-28: qty 2

## 2016-01-28 MED ORDER — SODIUM CHLORIDE 0.9 % IV BOLUS (SEPSIS)
1000.0000 mL | Freq: Once | INTRAVENOUS | Status: AC
  Administered 2016-01-28: 1000 mL via INTRAVENOUS

## 2016-01-28 MED ORDER — DEXAMETHASONE SODIUM PHOSPHATE 10 MG/ML IJ SOLN
10.0000 mg | Freq: Once | INTRAMUSCULAR | Status: AC
  Administered 2016-01-28: 10 mg via INTRAVENOUS
  Filled 2016-01-28: qty 1

## 2016-01-28 NOTE — ED Provider Notes (Signed)
[facility] Provider Note   [name] Arrival date & time: 01/28/16  1143     History   Chief Complaint Chief Complaint  Patient presents with  . Migraine    HPI Morgan [name] is a 40 y.o. female.  HPI   40 yo F with PMHx of chronic migraines here with 2 days of headache. Sx started two days ago with bilateral "bright spots" in her vision, similar to her migraines. She then developed aching, throbbing, left-sided HA with associated photophobia and phonophobia. She then developed n/v today while at work, and presents to the ED. Last migraine 1 month ago. She is out of her home triptan solution as it is on backorder. Denies fever, chills, neck stiffness. No recent cold sx. Symptoms are similar to her usual migraines. NO recent trauma. No vision changes, numbness, or weakness.  Past Medical History:  Diagnosis Date  . Abortion 03/09/2013  . Allergy   . Anxiety   . Asthma   . Depression   . Headache   . Kidney stone   . Migraines   . Pregnancy induced hypertension   . Sleep apnea     Patient Active Problem List   Diagnosis Date Noted  . Chronic migraine w/o aura w/o status migrainosus, not intractable 05/31/2015  . Major depressive disorder, recurrent, severe without psychotic features (HCC)   . MDD (major depressive disorder), recurrent episode, severe (HCC) 11/30/2014  . Ureterolithiasis 04/19/2014  . Migraine 03/18/2013  . OSA on CPAP 03/18/2013    Past Surgical History:  Procedure Laterality Date  . CERVICAL CERCLAGE     all 3 pregnancies  . DILATION AND CURETTAGE OF UTERUS  2001   retained placenta  . DILATION AND CURETTAGE OF UTERUS  2006   SAB  . MOUTH SURGERY  1999  . TUBAL LIGATION      OB History    Gravida Para Term Preterm AB Living   7 3 1 2 4 1    SAB TAB Ectopic Multiple Live Births   2 2 0 0 3       Home Medications    Prior to Admission medications   Medication Sig Start Date End Date Taking? Authorizing Provider    citalopram (CELEXA) 20 MG tablet Take 1 tablet (20 mg total) by mouth daily. 12/04/14   Thermon LeylandLaura A Davis, NP  divalproex (DEPAKOTE ER) 250 MG 24 hr tablet Take 3 tablets (750 mg total) by mouth daily. Start with one pill at night then increase to 2 pills in 2 weeks. 05/29/15   Anson FretAntonia B Ahern, MD  ibuprofen (ADVIL,MOTRIN) 800 MG tablet Take 1 tablet (800 mg total) by mouth every 8 (eight) hours as needed. No other NSAIDs, take with food. Patient taking differently: Take 800 mg by mouth every 8 (eight) hours as needed for headache or moderate pain. No other NSAIDs, take with food. 04/01/15   Thao P Le, DO  ipratropium (ATROVENT) 0.03 % nasal spray Place 2 sprays into both nostrils 4 (four) times daily. Patient taking differently: Place 2 sprays into both nostrils 2 (two) times daily as needed for rhinitis.  02/17/15   Peyton Najjaravid H Hopper, MD  ondansetron (ZOFRAN ODT) 4 MG disintegrating tablet Take 1 tablet (4 mg total) by mouth every 8 (eight) hours as needed for nausea or vomiting. 04/12/15   Anson FretAntonia B Ahern, MD  propranolol (INDERAL) 20 MG tablet Take 20 mg by mouth daily.     Historical Provider, MD  SUMAtriptan (IMITREX) 20 MG/ACT nasal  spray Place 1 spray (20 mg total) into the nose once. May repeat in 2 hours if headache persists or recurs. 04/12/15   Anson FretAntonia B Ahern, MD  traZODone (DESYREL) 50 MG tablet Take 0.5 tablets (25 mg total) by mouth at bedtime as needed for sleep. 12/04/14   Thermon LeylandLaura A Davis, NP    Family History Family History  Problem Relation Age of Onset  . HIV Mother   . Seizures Mother     secondary to drug abuse  . Drug abuse Mother   . Heart disease Maternal Grandfather   . Diabetes Maternal Grandfather   . Cancer Maternal Grandfather   . Diabetes Paternal Grandmother   . Cancer Paternal Grandfather   . Heart disease Maternal Grandmother   . Hypertension Maternal Grandmother   . Migraines Neg Hx     Social History Social History  Substance Use Topics  . Smoking status: Never  Smoker  . Smokeless tobacco: Never Used  . Alcohol use No     Allergies   Stadol [butorphanol tartrate] and Kale   Review of Systems Review of Systems  Constitutional: Negative for chills and fever.  HENT: Negative for congestion, rhinorrhea and sore throat.   Eyes: Negative for visual disturbance.  Respiratory: Negative for cough, shortness of breath and wheezing.   Cardiovascular: Negative for chest pain and leg swelling.  Gastrointestinal: Negative for abdominal pain, diarrhea, nausea and vomiting.  Genitourinary: Negative for dysuria, flank pain, vaginal bleeding and vaginal discharge.  Musculoskeletal: Negative for neck pain.  Skin: Negative for rash.  Allergic/Immunologic: Negative for immunocompromised state.  Neurological: Positive for headaches. Negative for syncope.  Hematological: Does not bruise/bleed easily.  All other systems reviewed and are negative.    Physical Exam Updated Vital Signs BP (!) 110/50 (BP Location: Left Arm)   Pulse (!) 50   Temp 98.7 F (37.1 C) (Oral)   Resp 18   SpO2 100%   Physical Exam  Constitutional: She is oriented to person, place, and time. She appears well-developed and well-nourished. No distress.  HENT:  Head: Normocephalic and atraumatic.  Eyes: Conjunctivae are normal.  Neck: Neck supple.  Cardiovascular: Normal rate, regular rhythm and normal heart sounds.  Exam reveals no friction rub.   No murmur heard. Pulmonary/Chest: Effort normal and breath sounds normal. No respiratory distress. She has no wheezes. She has no rales.  Abdominal: She exhibits no distension.  Musculoskeletal: She exhibits no edema.  Neurological: She is alert and oriented to person, place, and time. She exhibits normal muscle tone. Coordination normal.  Skin: Skin is warm. Capillary refill takes less than 2 seconds.  Psychiatric: She has a normal mood and affect.  Nursing note and vitals reviewed.   Neurological Exam:  Mental Status: Alert and  oriented to person, place, and time. Attention and concentration normal. Speech clear. Recent memory is intact. Cranial Nerves: Visual fields intact to confrontation in all quadrants bilaterally. EOMI and PERRLA. No nystagmus noted. Facial sensation intact at forehead, maxillary cheek, and chin/mandible bilaterally. No weakness of masticatory muscles. No facial asymmetry or weakness. Hearing grossly normal to finer rub. Uvula is midline, and palate elevates symmetrically. Normal SCM and trapezius strength. Tongue midline without fasciculations Motor: Muscle strength 5/5 in proximal and distal UE and LE bilaterally. No pronator drift. Muscle tone normal. Reflexes: 2+ and symmetrical in all four extremities.  Sensation: Intact to light touch in upper and lower extremities distally bilaterally.  Gait: Normal without ataxia. Coordination: Normal FTN bilaterally.  ED Treatments / Results  Labs (all labs ordered are listed, but only abnormal results are displayed) Labs Reviewed - No data to display  EKG  EKG Interpretation None       Radiology No results found.  Procedures Procedures (including critical care time)  Medications Ordered in ED Medications  sodium chloride 0.9 % bolus 1,000 mL (0 mLs Intravenous Stopped 01/28/16 1326)  prochlorperazine (COMPAZINE) injection 10 mg (10 mg Intravenous Given 01/28/16 1240)  diphenhydrAMINE (BENADRYL) injection 25 mg (25 mg Intravenous Given 01/28/16 1241)  dexamethasone (DECADRON) injection 10 mg (10 mg Intravenous Given 01/28/16 1241)  ketorolac (TORADOL) injection 15 mg (15 mg Intramuscular Given 01/28/16 1241)     Initial Impression / Assessment and Plan / ED Course  I have reviewed the triage vital signs and the nursing notes.  Pertinent labs & imaging results that were available during my care of the patient were reviewed by me and considered in my medical decision making (see chart for details).  Clinical Course     40 yo F  with h/o recurrent migraines here with migraine HA. No fever or chills. Sx feel similar to her usual migraine. Suspect acute on chronic migraine. No red flag sx. No fever, chills, neck stiffness, and pt well appearing - doubt meningitis or encephalitis. Neuro exam non-focal, doubt intracranial mass or lesion. HA began with classic aura, is similar to her usual migraine, and is not c/w SAH. Sx resolved with migraine cocktail in ED. Will d/c with outpt fu, return precautions.  Final Clinical Impressions(s) / ED Diagnoses   Final diagnoses:  Other migraine without status migrainosus, not intractable    New Prescriptions Discharge Medication List as of 01/28/2016  1:20 PM       Shaune Pollack, MD 01/28/16 [phone]

## 2016-01-28 NOTE — ED Triage Notes (Signed)
Patient complains of migraine headache since yesterday. States that the pain is more left sided with nausea and vomiting. Alert and oriented, hx of same.

## 2016-06-22 ENCOUNTER — Encounter (HOSPITAL_COMMUNITY): Payer: Self-pay

## 2016-06-22 DIAGNOSIS — G43809 Other migraine, not intractable, without status migrainosus: Secondary | ICD-10-CM | POA: Insufficient documentation

## 2016-06-22 DIAGNOSIS — G43909 Migraine, unspecified, not intractable, without status migrainosus: Secondary | ICD-10-CM | POA: Diagnosis present

## 2016-06-22 DIAGNOSIS — Z79899 Other long term (current) drug therapy: Secondary | ICD-10-CM | POA: Diagnosis not present

## 2016-06-22 DIAGNOSIS — J45909 Unspecified asthma, uncomplicated: Secondary | ICD-10-CM | POA: Insufficient documentation

## 2016-06-22 MED ORDER — ONDANSETRON 4 MG PO TBDP
ORAL_TABLET | ORAL | Status: AC
  Administered 2016-06-22: 4 mg via ORAL
  Filled 2016-06-22: qty 1

## 2016-06-22 MED ORDER — ONDANSETRON 4 MG PO TBDP
4.0000 mg | ORAL_TABLET | Freq: Once | ORAL | Status: AC
  Administered 2016-06-22: 4 mg via ORAL

## 2016-06-22 MED ORDER — OXYCODONE-ACETAMINOPHEN 5-325 MG PO TABS
1.0000 | ORAL_TABLET | ORAL | Status: DC | PRN
  Administered 2016-06-22: 1 via ORAL
  Filled 2016-06-22: qty 1

## 2016-06-22 NOTE — ED Triage Notes (Signed)
Pt endorses migraine with nausea that began last night and has hx of same. VSS. Neuro intact.

## 2016-06-23 ENCOUNTER — Emergency Department (HOSPITAL_COMMUNITY)
Admission: EM | Admit: 2016-06-23 | Discharge: 2016-06-23 | Disposition: A | Discharge status: Home / Self Care | Payer: BLUE CROSS/BLUE SHIELD | Attending: Emergency Medicine | Admitting: Emergency Medicine

## 2016-06-23 DIAGNOSIS — G43809 Other migraine, not intractable, without status migrainosus: Secondary | ICD-10-CM

## 2016-06-23 MED ORDER — SODIUM CHLORIDE 0.9 % IV BOLUS (SEPSIS)
1000.0000 mL | Freq: Once | INTRAVENOUS | Status: AC
  Administered 2016-06-23: 1000 mL via INTRAVENOUS

## 2016-06-23 MED ORDER — DEXAMETHASONE SODIUM PHOSPHATE 10 MG/ML IJ SOLN
10.0000 mg | Freq: Once | INTRAMUSCULAR | Status: AC
  Administered 2016-06-23: 10 mg via INTRAVENOUS
  Filled 2016-06-23: qty 1

## 2016-06-23 MED ORDER — PROCHLORPERAZINE EDISYLATE 5 MG/ML IJ SOLN
10.0000 mg | Freq: Once | INTRAMUSCULAR | Status: AC
  Administered 2016-06-23: 10 mg via INTRAVENOUS
  Filled 2016-06-23: qty 2

## 2016-06-23 MED ORDER — DIPHENHYDRAMINE HCL 50 MG/ML IJ SOLN
25.0000 mg | Freq: Once | INTRAMUSCULAR | Status: AC
  Administered 2016-06-23: 25 mg via INTRAVENOUS
  Filled 2016-06-23: qty 1

## 2016-06-23 MED ORDER — KETOROLAC TROMETHAMINE 30 MG/ML IJ SOLN
30.0000 mg | Freq: Once | INTRAMUSCULAR | Status: AC
  Administered 2016-06-23: 30 mg via INTRAVENOUS
  Filled 2016-06-23: qty 1

## 2016-06-23 MED ORDER — SUMATRIPTAN SUCCINATE 100 MG PO TABS: 100.0000 mg | ORAL_TABLET | ORAL | 0 refills | Status: DC | PRN

## 2016-06-23 NOTE — ED Provider Notes (Signed)
[facility]-EMERGENCY DEPT Provider Note   [name] Arrival date & time: 06/22/16  2022 By signing my name below, I, Morgan [name], attest that this documentation has been prepared under the direction and in the presence of Gilda Crease, MD . Electronically Signed: Levon [name], Scribe. 06/23/2016. 1:03 AM.   History   Chief Complaint Chief Complaint  Patient presents with  . Migraine   HPI Morgan [name] is a 41 y.o. female with a history of migraines who presents to the Emergency Department complaining of constant, severe,  throbbing left lateral headache onset yesterday. She reports associated photophobia, phonophobia, and nausea. Per pt, this is typical for her migraines. Per pt, she was taking Imitrex nasal powder and has significant relief with this in the past, but her pharmacy no longer has this medication. She is currently followed by a neurologist. Pt has no other acute complaints or associated symptoms at this time.   The history is provided by the patient. No language interpreter was used.   Past Medical History:  Diagnosis Date  . Abortion 03/09/2013  . Allergy   . Anxiety   . Asthma   . Depression   . Headache   . Kidney stone   . Migraines   . Pregnancy induced hypertension   . Sleep apnea     Patient Active Problem List   Diagnosis Date Noted  . Chronic migraine w/o aura w/o status migrainosus, not intractable 05/31/2015  . Major depressive disorder, recurrent, severe without psychotic features (HCC)   . MDD (major depressive disorder), recurrent episode, severe (HCC) 11/30/2014  . Ureterolithiasis 04/19/2014  . Migraine 03/18/2013  . OSA on CPAP 03/18/2013   Past Surgical History:  Procedure Laterality Date  . CERVICAL CERCLAGE     all 3 pregnancies  . DILATION AND CURETTAGE OF UTERUS  2001   retained placenta  . DILATION AND CURETTAGE OF UTERUS  2006   SAB  . MOUTH SURGERY  1999  . TUBAL LIGATION      OB History    Gravida Para  Term Preterm AB Living   SAB TAB Ectopic Multiple Live Births   2 2 0 0 3       Home Medications    Prior to Admission medications   Medication Sig Start Date End Date Taking? Authorizing Provider  ibuprofen (ADVIL,MOTRIN) 200 MG tablet Take 400-800 mg by mouth every 6 (six) hours as needed for headache.   Yes Historical Provider, MD  citalopram (CELEXA) 20 MG tablet Take 1 tablet (20 mg total) by mouth daily. Patient not taking: Reported on 06/23/2016 12/04/14   Thermon Leyland, NP  divalproex (DEPAKOTE ER) 250 MG 24 hr tablet Take 3 tablets (750 mg total) by mouth daily. Start with one pill at night then increase to 2 pills in 2 weeks. Patient not taking: Reported on 06/23/2016 05/29/15   Anson Fret, MD  ibuprofen (ADVIL,MOTRIN) 800 MG tablet Take 1 tablet (800 mg total) by mouth every 8 (eight) hours as needed. No other NSAIDs, take with food. Patient not taking: Reported on 06/23/2016 04/01/15   Thao P Le, DO  ipratropium (ATROVENT) 0.03 % nasal spray Place 2 sprays into both nostrils 4 (four) times daily. Patient not taking: Reported on 06/23/2016 02/17/15   Peyton Najjar, MD  ondansetron (ZOFRAN ODT) 4 MG disintegrating tablet Take 1 tablet (4 mg total) by mouth every 8 (eight) hours as needed for nausea or vomiting. Patient  not taking: Reported on 06/23/2016 04/12/15   Anson Fret, MD  SUMAtriptan (IMITREX) 100 MG tablet Take 1 tablet (100 mg total) by mouth every 2 (two) hours as needed for migraine. May repeat in 2 hours if headache persists or recurs. 06/23/16   Gilda Crease, MD  SUMAtriptan (IMITREX) 20 MG/ACT nasal spray Place 1 spray (20 mg total) into the nose once. May repeat in 2 hours if headache persists or recurs. Patient not taking: Reported on 06/23/2016 04/12/15   Anson Fret, MD  traZODone (DESYREL) 50 MG tablet Take 0.5 tablets (25 mg total) by mouth at bedtime as needed for sleep. Patient not taking: Reported on 06/23/2016 12/04/14   Thermon Leyland, NP    Family History Family History  Problem Relation Age of Onset  . HIV Mother   . Seizures Mother     secondary to drug abuse  . Drug abuse Mother   . Heart disease Maternal Grandfather   . Diabetes Maternal Grandfather   . Cancer Maternal Grandfather   . Diabetes Paternal Grandmother   . Cancer Paternal Grandfather   . Heart disease Maternal Grandmother   . Hypertension Maternal Grandmother   . Migraines Neg Hx     Social History Social History  Substance Use Topics  . Smoking status: Never Smoker  . Smokeless tobacco: Never Used  . Alcohol use No     Allergies   Stadol [butorphanol tartrate] and Kale   Review of Systems Review of Systems All systems reviewed and are negative for acute change except as noted in the HPI.  Physical Exam Updated Vital Signs BP 113/64   Pulse (!) 52   Temp 98.8 F (37.1 C) (Oral)   Resp 18   Ht  (1.651 m)   Wt 190 lb (86.2 kg)   LMP 06/10/2016 (Exact Date)   SpO2 99%   BMI 31.62 kg/m   Physical Exam  Constitutional: She is oriented to person, place, and time. She appears well-developed and well-nourished. No distress.  HENT:  Head: Normocephalic and atraumatic.  Right Ear: Hearing normal.  Left Ear: Hearing normal.  Nose: Nose normal.  Mouth/Throat: Oropharynx is clear and moist and mucous membranes are normal.  Eyes: Conjunctivae and EOM are normal. Pupils are equal, round, and reactive to light.  Neck: Normal range of motion. Neck supple.  Cardiovascular: Regular rhythm, S1 normal and S2 normal.  Exam reveals no gallop and no friction rub.   No murmur heard. Pulmonary/Chest: Effort normal and breath sounds normal. No respiratory distress. She exhibits no tenderness.  Abdominal: Soft. Normal appearance and bowel sounds are normal. There is no hepatosplenomegaly. There is no tenderness. There is no rebound, no guarding, no tenderness at McBurney's point and negative Murphy's sign. No hernia.    Musculoskeletal: Normal range of motion.  Neurological: She is alert and oriented to person, place, and time. She has normal strength. No cranial nerve deficit or sensory deficit. Coordination normal. GCS eye subscore is 4. GCS verbal subscore is 5. GCS motor subscore is 6.  Skin: Skin is warm, dry and intact. No rash noted. No cyanosis.  Psychiatric: She has a normal mood and affect. Her speech is normal and behavior is normal. Thought content normal.  Nursing note and vitals reviewed.  ED Treatments / Results  DIAGNOSTIC STUDIES:  Oxygen Saturation is 99% on RA, normal by my interpretation.    COORDINATION OF CARE:  1:04 AM Will order Decadron, Toradol, Compazine and Benadryl. Discussed  treatment plan with pt at bedside and pt agreed to plan.   Labs (all labs ordered are listed, but only abnormal results are displayed) Labs Reviewed - No data to display  EKG  EKG Interpretation None       Radiology No results found.  Procedures Procedures (including critical care time)  Medications Ordered in ED Medications  oxyCODONE-acetaminophen (PERCOCET/ROXICET) 5-325 MG per tablet 1 tablet (1 tablet Oral Given 06/22/16 2039)  ondansetron (ZOFRAN-ODT) disintegrating tablet 4 mg (4 mg Oral Given 06/22/16 2039)  sodium chloride 0.9 % bolus 1,000 mL (0 mLs Intravenous Stopped 06/23/16 0301)  dexamethasone (DECADRON) injection 10 mg (10 mg Intravenous Given 06/23/16 0128)  ketorolac (TORADOL) 30 MG/ML injection 30 mg (30 mg Intravenous Given 06/23/16 0128)  prochlorperazine (COMPAZINE) injection 10 mg (10 mg Intravenous Given 06/23/16 0128)  diphenhydrAMINE (BENADRYL) injection 25 mg (25 mg Intravenous Given 06/23/16 0128)     Initial Impression / Assessment and Plan / ED Course  I have reviewed the triage vital signs and the nursing notes.  Pertinent labs & imaging results that were available during my care of the patient were reviewed by me and considered in my medical decision making  (see chart for details).     Patient presents to the emergency department for evaluation of migraine headache. Patient is currently experiencing a headache that is similar to previous migraines. Patient does not have any unusual features compared to previous migraines. Patient has normal neurologic examination. There are no unusual features, such as unusual intensity or sudden onset. As this headache is similar to previous migraines, there is no concern for subarachnoid hemorrhage or other etiology. Patient therefore does not require imaging. Patient treated as migraine headache.  Final Clinical Impressions(s) / ED Diagnoses   Final diagnoses:  Other migraine without status migrainosus, not intractable    New Prescriptions New Prescriptions   SUMATRIPTAN (IMITREX) 100 MG TABLET    Take 1 tablet (100 mg total) by mouth every 2 (two) hours as needed for migraine. May repeat in 2 hours if headache persists or recurs.   I personally performed the services described in this documentation, which was scribed in my presence. The recorded information has been reviewed and is accurate.    Gilda Crease, MD 06/23/16 [phone]

## 2017-05-22 ENCOUNTER — Encounter (HOSPITAL_COMMUNITY): Payer: Self-pay | Admitting: *Deleted

## 2017-05-22 ENCOUNTER — Other Ambulatory Visit: Payer: Self-pay

## 2017-05-22 ENCOUNTER — Emergency Department (HOSPITAL_COMMUNITY)
Admission: EM | Admit: 2017-05-22 | Discharge: 2017-05-22 | Disposition: A | Discharge status: Home / Self Care | Payer: BLUE CROSS/BLUE SHIELD | Attending: Emergency Medicine | Admitting: Emergency Medicine

## 2017-05-22 DIAGNOSIS — Z79899 Other long term (current) drug therapy: Secondary | ICD-10-CM | POA: Insufficient documentation

## 2017-05-22 DIAGNOSIS — G43909 Migraine, unspecified, not intractable, without status migrainosus: Secondary | ICD-10-CM | POA: Insufficient documentation

## 2017-05-22 DIAGNOSIS — J45909 Unspecified asthma, uncomplicated: Secondary | ICD-10-CM | POA: Insufficient documentation

## 2017-05-22 MED ORDER — PROMETHAZINE HCL 25 MG PO TABS
25.0000 mg | ORAL_TABLET | Freq: Once | ORAL | Status: AC
  Administered 2017-05-22: 25 mg via ORAL
  Filled 2017-05-22: qty 1

## 2017-05-22 MED ORDER — KETOROLAC TROMETHAMINE 30 MG/ML IJ SOLN
30.0000 mg | Freq: Once | INTRAMUSCULAR | Status: AC
  Administered 2017-05-22: 30 mg via INTRAMUSCULAR
  Filled 2017-05-22: qty 1

## 2017-05-22 MED ORDER — ONDANSETRON 4 MG PO TBDP
4.0000 mg | ORAL_TABLET | Freq: Once | ORAL | Status: AC | PRN
  Administered 2017-05-22: 4 mg via ORAL
  Filled 2017-05-22: qty 1

## 2017-05-22 NOTE — Discharge Instructions (Addendum)
Please read attached information. If you experience any new or worsening signs or symptoms please return to the emergency room for evaluation. Please follow-up with your primary care provider or specialist as discussed.  °

## 2017-05-22 NOTE — ED Triage Notes (Addendum)
To ED for treatment of migraine. States this started yesterday but she felt like she could ibuprofen and rest but no relief. Nausea and vomiting in triage

## 2017-05-22 NOTE — ED Provider Notes (Signed)
[facility] EMERGENCY DEPARTMENT Provider Note   [name] Arrival date & time: 05/22/17  1208     History   Chief Complaint Chief Complaint  Patient presents with  . Migraine    HPI Morgan [name] is a 42 y.o. female.  HPI    42 year old female presents today with complaints of migraine.  Patient has history of the same.  She notes recently that have been infrequent.  She notes a throbbing sensation in the forehead typical of previous.  She denies years.  Ibuprofen not helping at home.  No neck stiffness fever, trauma, or any other red flags.  Patient has had good results with Toradol here in the ED.  Past Medical History:  Diagnosis Date  . Abortion 03/09/2013  . Allergy   . Anxiety   . Asthma   . Depression   . Headache   . Kidney stone   . Migraines   . Pregnancy induced hypertension   . Sleep apnea     Patient Active Problem List   Diagnosis Date Noted  . Chronic migraine w/o aura w/o status migrainosus, not intractable 05/31/2015  . Major depressive disorder, recurrent, severe without psychotic features (HCC)   . MDD (major depressive disorder), recurrent episode, severe (HCC) 11/30/2014  . Ureterolithiasis 04/19/2014  . Migraine 03/18/2013  . OSA on CPAP 03/18/2013    Past Surgical History:  Procedure Laterality Date  . CERVICAL CERCLAGE     all 3 pregnancies  . DILATION AND CURETTAGE OF UTERUS  2001   retained placenta  . DILATION AND CURETTAGE OF UTERUS  2006   SAB  . MOUTH SURGERY  1999  . TUBAL LIGATION      OB History    Gravida  7   Para  3   Term  1   Preterm  2   AB  4   Living  1     SAB  2   TAB  2   Ectopic  0   Multiple  0   Live Births  3            Home Medications    Prior to Admission medications   Medication Sig Start Date End Date Taking? Authorizing Provider  citalopram (CELEXA) 20 MG tablet Take 1 tablet (20 mg total) by mouth daily. Patient not taking: Reported on  06/23/2016 12/04/14   Thermon Leyland, NP  divalproex (DEPAKOTE ER) 250 MG 24 hr tablet Take 3 tablets (750 mg total) by mouth daily. Start with one pill at night then increase to 2 pills in 2 weeks. Patient not taking: Reported on 06/23/2016 05/29/15   Anson Fret, MD  ibuprofen (ADVIL,MOTRIN) 200 MG tablet Take 400-800 mg by mouth every 6 (six) hours as needed for headache.    [provider]  ibuprofen (ADVIL,MOTRIN) 800 MG tablet Take 1 tablet (800 mg total) by mouth every 8 (eight) hours as needed. No other NSAIDs, take with food. Patient not taking: Reported on 06/23/2016 04/01/15   Le, Thao P, DO  ipratropium (ATROVENT) 0.03 % nasal spray Place 2 sprays into both nostrils 4 (four) times daily. Patient not taking: Reported on 06/23/2016 02/17/15   Peyton Najjar, MD  ondansetron (ZOFRAN ODT) 4 MG disintegrating tablet Take 1 tablet (4 mg total) by mouth every 8 (eight) hours as needed for nausea or vomiting. Patient not taking: Reported on 06/23/2016 04/12/15   Anson Fret, MD  SUMAtriptan (IMITREX) 100 MG tablet Take  1 tablet (100 mg total) by mouth every 2 (two) hours as needed for migraine. May repeat in 2 hours if headache persists or recurs. 06/23/16   Gilda CreasePollina, Christopher J, MD  SUMAtriptan (IMITREX) 20 MG/ACT nasal spray Place 1 spray (20 mg total) into the nose once. May repeat in 2 hours if headache persists or recurs. Patient not taking: Reported on 06/23/2016 04/12/15   Anson FretAhern, Antonia B, MD  traZODone (DESYREL) 50 MG tablet Take 0.5 tablets (25 mg total) by mouth at bedtime as needed for sleep. Patient not taking: Reported on 06/23/2016 12/04/14   Thermon Leylandavis, Laura A, NP    Family History Family History  Problem Relation Age of Onset  . HIV Mother   . Seizures Mother        secondary to drug abuse  . Drug abuse Mother   . Heart disease Maternal Grandfather   . Diabetes Maternal Grandfather   . Cancer Maternal Grandfather   . Diabetes Paternal Grandmother   . Cancer Paternal  Grandfather   . Heart disease Maternal Grandmother   . Hypertension Maternal Grandmother   . Migraines Neg Hx     Social History Social History   Tobacco Use  . Smoking status: Never Smoker  . Smokeless tobacco: Never Used  Substance Use Topics  . Alcohol use: No    Alcohol/week: 0.0 oz  . Drug use: Yes    Frequency: 1.0 times per week    Types: Marijuana    Comment: last use 09/13/15     Allergies   Stadol [butorphanol tartrate] and Kale   Review of Systems Review of Systems  All other systems reviewed and are negative.    Physical Exam Updated Vital Signs BP (!) 142/98 (BP Location: Right Arm)   Temp 98.8 F (37.1 C) (Oral)   Resp 16   SpO2 100%   Physical Exam  Constitutional: She is oriented to person, place, and time. She appears well-developed and well-nourished. No distress.  HENT:  Head: Normocephalic and atraumatic.  Eyes: Pupils are equal, round, and reactive to light. Conjunctivae and EOM are normal. Right eye exhibits no discharge. Left eye exhibits no discharge. No scleral icterus.  Neck: Normal range of motion. Neck supple. No JVD present. No tracheal deviation present.  Pulmonary/Chest: Effort normal. No stridor.  Musculoskeletal: Normal range of motion. She exhibits no edema or tenderness.  Lymphadenopathy:    She has no cervical adenopathy.  Neurological: She is alert and oriented to person, place, and time. She has normal strength. She displays no atrophy and no tremor. No cranial nerve deficit or sensory deficit. She exhibits normal muscle tone. She displays a negative Romberg sign. She displays no seizure activity. Coordination and gait normal. GCS eye subscore is 4. GCS verbal subscore is 5. GCS motor subscore is 6.  Skin: She is not diaphoretic.  Psychiatric: She has a normal mood and affect. Her behavior is normal. Judgment and thought content normal.  Nursing note and vitals reviewed.    ED Treatments / Results  Labs (all labs ordered  are listed, but only abnormal results are displayed) Labs Reviewed - No data to display  EKG  EKG Interpretation None       Radiology No results found.  Procedures Procedures (including critical care time)  Medications Ordered in ED Medications  ondansetron (ZOFRAN-ODT) disintegrating tablet 4 mg (4 mg Oral Given 05/22/17 1240)  ketorolac (TORADOL) 30 MG/ML injection 30 mg (30 mg Intramuscular Given 05/22/17 1454)  promethazine (PHENERGAN) tablet 25  mg (25 mg Oral Given 05/22/17 1454)     Initial Impression / Assessment and Plan / ED Course  I have reviewed the triage vital signs and the nursing notes.  Pertinent labs & imaging results that were available during my care of the patient were reviewed by me and considered in my medical decision making (see chart for details).       Final Clinical Impressions(s) / ED Diagnoses   Final diagnoses:  Migraine without status migrainosus, not intractable, unspecified migraine type    Uncomplicated migraine here.  No red flags.  Symptoms dramatically improved.  Discharged with outpatient follow-up strict return precautions.  Patient verbalized understanding and agreement to today's plan.  ED Discharge Orders    None       Eyvonne Mechanic, Cordelia Poche 05/22/17 1557    Mabe, Latanya Maudlin, MD 05/22/17 1600

## 2017-08-07 ENCOUNTER — Ambulatory Visit (INDEPENDENT_AMBULATORY_CARE_PROVIDER_SITE_OTHER): Payer: BLUE CROSS/BLUE SHIELD | Admitting: Neurology

## 2017-08-07 ENCOUNTER — Encounter: Payer: Self-pay | Admitting: Neurology

## 2017-08-07 VITALS — BP 121/76 | HR 69 | Ht 65.0 in | Wt 195.0 lb

## 2017-08-07 DIAGNOSIS — R110 Nausea: Secondary | ICD-10-CM | POA: Diagnosis not present

## 2017-08-07 DIAGNOSIS — G43009 Migraine without aura, not intractable, without status migrainosus: Secondary | ICD-10-CM

## 2017-08-07 DIAGNOSIS — G4733 Obstructive sleep apnea (adult) (pediatric): Secondary | ICD-10-CM | POA: Diagnosis not present

## 2017-08-07 MED ORDER — ONDANSETRON 4 MG PO TBDP: 4.0000 mg | ORAL_TABLET | Freq: Three times a day (TID) | ORAL | 11 refills | Status: DC | PRN

## 2017-08-07 NOTE — Progress Notes (Addendum)
[name]    Provider:  Dr Lucia Gaskins Referring Provider: Ethelda Chick, MD Primary Care Physician:  Ethelda Chick, MD  CC: Migraine  Interval history 08/07/2017: She has 8 headache days a month - 2 migraines that may persist up to  3 days. She feels Improved. She likes onzetra for acute management.  [name] helps. Discussed not to take more than 2x a day or 2-3x a week to avoid medication overuse headache. She is happy with management and frequency. Discussed the new CGRP medications and other options for preventatives. Provided samples of Onztra, she will call when she needs a refill. She no longer takes propranolol or depakote. Discussed cpap compliance, obesity, weight loss.  Interval history 05/29/2015: She has only had 2 migraines since being seen. So she is slightly improved. The Depakote may be helping a little. No side effects from the Depakote.  Shehas sleep apnea and is non compliant with her cpap. She works days now. She takes the propranolol twice daily. The migraines are better. The imitrex spray does help. Will increase the Depakote. Will start a medrol dosepak. Will see her back in 6 weeks for follow up.Will perform nerve blocks today in the office for the acute migraine. This migraine has lasted for days and is intractable.  HPI 04/12/2015: Destinae Neubecker is a 42 y.o. female here as a referral from Dr. Katrinka Blazing for migraine. PMHx depression and anxiety. Also documented is Sleep apnea, kidney stone. Migraines started over 10 years ago. In the past few years they have worsened. She has go to the ED. They are on the left side, pounding, light sensitivity, sound sensitivity, she needs quiet and dark, smells trigger nausea and vomiting, blurred vision. She is having at least 15 headache days a month. At least 8 or 10 migraines. They can last all day. She has an occ aura but not all the time. She take propranolol and celexa. She take ibuprofen a few times a week and no  medication. She has a history of kidney stone so can't use Topamax. She had tubal ligation in 2015 so we can use Depakote.   Reviewed notes, labs and imaging from outside physicians, which showed: Per primary care notes, she take ibuprofen and benadryl for her headaches. She has also c/o LBP, dizziness,abdominal pain,URI, dysuria recently. She was seen in the ED 01/03/2015 for persistent headache for 3 days typical of her migraines, taking ibuprofen and naproxen and she has been seen in the ED multiple times with relief by migraine cocktail.   CT of the head showed no acute intracranial abnormalities including mass lesion or mass effect, hydrocephalus, extra-axial fluid collection, midline shift, hemorrhage, or acute infarction, large ischemic events (personally reviewed images)   Review of Systems: Patient complains of symptoms per HPI as well as the following symptoms: migraine. Pertinent negatives per HPI. All others negative.    Social History   Socioeconomic History  . Marital status: Legally Separated    Spouse name: [name]  . Number of children: 1  . Years of education: 26  . Highest education level: Not on file  Occupational History  . Occupation: Development worker, community: [employer]  Social Needs  . Financial resource strain: Not on file  . Food insecurity:    Worry: Not on file    Inability: Not on file  . Transportation needs:    Medical: Not on file    Non-medical: Not on file  Tobacco Use  . Smoking status:  Never Smoker  . Smokeless tobacco: Never Used  Substance and Sexual Activity  . Alcohol use: Yes    Alcohol/week: 0.0 oz    Comment: every once in awhile  . Drug use: Yes    Frequency: 7.0 times per week    Types: Marijuana    Comment: last use 09/13/15; as of 08/07/17 still smokes daily  . Sexual activity: Yes    Partners: Male    Birth control/protection: None  Lifestyle  . Physical activity:    Days per week: Not on file    Minutes per session: Not on  file  . Stress: Not on file  Relationships  . Social connections:    Talks on phone: Not on file    Gets together: Not on file    Attends religious service: Not on file    Active member of club or organization: Not on file    Attends meetings of clubs or organizations: Not on file    Relationship status: Not on file  . Intimate partner violence:    Fear of current or ex partner: Not on file    Emotionally abused: Not on file    Physically abused: Not on file    Forced sexual activity: Not on file  Other Topics Concern  . Not on file  Social History Narrative   Lives with her daughter (born 2007).   Drinks caffeine a few times a week, not daily   Left handed    Family History  Problem Relation Age of Onset  . HIV Mother   . Seizures Mother        secondary to drug abuse  . Drug abuse Mother   . Heart disease Maternal Grandfather   . Diabetes Maternal Grandfather   . Cancer Maternal Grandfather   . Diabetes Paternal Grandmother   . Cancer Paternal Grandfather   . Heart disease Maternal Grandmother   . Hypertension Maternal Grandmother   . Migraines Neg Hx     Past Medical History:  Diagnosis Date  . Abortion 03/09/2013  . Allergy   . Anxiety   . Asthma   . Depression   . Headache   . Kidney stone   . Migraines   . Pregnancy induced hypertension   . Sleep apnea     Past Surgical History:  Procedure Laterality Date  . CERVICAL CERCLAGE     all 3 pregnancies  . DILATION AND CURETTAGE OF UTERUS  2001   retained placenta  . DILATION AND CURETTAGE OF UTERUS  2006   SAB  . MOUTH SURGERY  1999  . TUBAL LIGATION      Current Outpatient Medications  Medication Sig Dispense Refill  . Estrogens, Conjugated (PREMARIN VA) Place 1 tablet vaginally at bedtime.    Marland Kitchen. PROGESTERONE VA Place 1 capsule vaginally at bedtime.    Marland Kitchen. ibuprofen (ADVIL,MOTRIN) 200 MG tablet Take 400-800 mg by mouth every 6 (six) hours as needed for headache.    . ondansetron (ZOFRAN-ODT) 4 MG  disintegrating tablet Take 1 tablet (4 mg total) by mouth every 8 (eight) hours as needed for nausea. 60 tablet 11  . SUMAtriptan Succinate (ONZETRA XSAIL) 11 MG/NOSEPC EXHP Place 1 spray into both nostrils once for 1 dose. Administer 2 nose pieces one per nostril. At onset of migraine can repeat after 2 hours no more than 2 doses in 24 hours 1 each 11   No current facility-administered medications for this visit.     Allergies as of 08/07/2017 -  Review Complete 08/07/2017  Allergen Reaction Noted  . Stadol [butorphanol tartrate] Itching 07/03/2011  . Kale Itching and Rash 12/14/2012    Vitals: BP 121/76 (BP Location: Right Arm, Patient Position: Sitting)   Pulse 69   Ht 5\' 5"  (1.651 m)   Wt 195 lb (88.5 kg)   BMI 32.45 kg/m  Last Weight:  Wt Readings from Last 1 Encounters:  08/07/17 195 lb (88.5 kg)   Last Height:   Ht Readings from Last 1 Encounters:  08/07/17 5\' 5"  (1.651 m)   Physical exam: Exam: Gen: NAD, conversant, well nourised, obese, well groomed                     CV: RRR, no MRG. No Carotid Bruits. No peripheral edema, warm, nontender Eyes: Conjunctivae clear without exudates or hemorrhage  Neuro: Detailed Neurologic Exam  Speech:    Speech is normal; fluent and spontaneous with normal comprehension.  Cognition:    The patient is oriented to person, place, and time;     recent and remote memory intact;     language fluent;     normal attention, concentration,     fund of knowledge Cranial Nerves:    The pupils are equal, round, and reactive to light. The fundi are normal and spontaneous venous pulsations are present. Visual fields are full to finger confrontation. Extraocular movements are intact. Trigeminal sensation is intact and the muscles of mastication are normal. The face is symmetric. The palate elevates in the midline. Hearing intact. Voice is normal. Shoulder shrug is normal. The tongue has normal motion without fasciculations.   Coordination:     Normal finger to nose and heel to shin. Normal rapid alternating movements.   Gait:    Heel-toe and tandem gait are normal.   Motor Observation:    No asymmetry, no atrophy, and no involuntary movements noted. Tone:    Normal muscle tone.    Posture:    Posture is normal. normal erect    Strength:    Strength is V/V in the upper and lower limbs.      Sensation: intact to LT     Reflex Exam:  DTR's:    Deep tendon reflexes in the upper and lower extremities are normal bilaterally.   Toes:    The toes are downgoing bilaterally.   Clonus:    Clonus is absent. s.        Assessment/Plan: 42 year old with migraines. She was on propranolol and depakote but stopped them, she uses onzetra and zofran acutely, not interested in preventatives at this time feels happy "as is".   - Can't use Topamax due to nephrolithiasis. Continue -Discussed botox for migraine and also the new CGRP meds  -Use imitrex powder nasal spray for acute menagement. The most common side-effects are feeling sick (nausea), dizziness and dry mouth. In addition, triptans can also cause some people to experience strange sensations. These may be a tightness, tingling, flushing, and feelings of heaviness or pressure in areas such as the face and limbs, and occasionally the chest. Serious side effects can include stroke, cardiac side effects such as chest tightness, shortness of breath and possible cardiovascular adverse effects.  -Cont Zofran for nausea Refill meds Discussed sleep apnea and compliance with cpap importance Obesity: discussed good diet, Healthy [facility], exercise Discussed: To prevent or relieve headaches, try the following: Cool Compress. Lie down and place a cool compress on your head.  Avoid headache triggers.  If certain foods or odors seem to have triggered your migraines in the past, avoid them. A headache diary might help you identify triggers.  Include physical activity in  your daily routine. Try a daily walk or other moderate aerobic exercise.  Manage stress. Find healthy ways to cope with the stressors, such as delegating tasks on your to-do list.  Practice relaxation techniques. Try deep breathing, yoga, massage and visualization.  Eat regularly. Eating regularly scheduled meals and maintaining a healthy diet might help prevent headaches. Also, drink plenty of fluids.  Follow a regular sleep schedule. Sleep deprivation might contribute to headaches Consider biofeedback. With this mind-body technique, you learn to control certain bodily functions - such as muscle tension, heart rate and blood pressure - to prevent headaches or reduce headache pain.    Proceed to emergency room if you experience new or worsening symptoms or symptoms do not resolve, if you have new neurologic symptoms or if headache is severe, or for any concerning symptom.   Provided education and documentation from American headache Society toolbox including articles on: chronic migraine, obesity, osa, nause medication overuse headache, chronic migraines, prevention of migraines, behavioral and other nonpharmacologic treatments for headache.  Naomie Dean, MD  [facility] [address] [place], [place] [address]  Phone [phone] Fax [phone]  A total of 25 minutes was spent face-to-face with this patient. Over half this time was spent on counseling patient on the migraine diagnosis and different diagnostic and therapeutic options available.

## 2017-08-10 MED ORDER — ONDANSETRON 4 MG PO TBDP: 4.0000 mg | ORAL_TABLET | Freq: Three times a day (TID) | ORAL | 11 refills | Status: DC | PRN

## 2017-08-10 MED ORDER — SUMATRIPTAN SUCCINATE 11 MG/NOSEPC NA EXHP: 1.0000 | INHALANT_POWDER | Freq: Once | NASAL | 11 refills | Status: DC

## 2017-10-09 ENCOUNTER — Emergency Department (HOSPITAL_COMMUNITY): Payer: BLUE CROSS/BLUE SHIELD

## 2017-10-09 ENCOUNTER — Encounter (HOSPITAL_COMMUNITY): Payer: Self-pay | Admitting: *Deleted

## 2017-10-09 ENCOUNTER — Other Ambulatory Visit: Payer: Self-pay

## 2017-10-09 ENCOUNTER — Emergency Department (HOSPITAL_COMMUNITY)
Admission: EM | Admit: 2017-10-09 | Discharge: 2017-10-09 | Disposition: A | Discharge status: Home / Self Care | Payer: BLUE CROSS/BLUE SHIELD | Attending: Emergency Medicine | Admitting: Emergency Medicine

## 2017-10-09 DIAGNOSIS — J45909 Unspecified asthma, uncomplicated: Secondary | ICD-10-CM | POA: Diagnosis not present

## 2017-10-09 DIAGNOSIS — M25561 Pain in right knee: Secondary | ICD-10-CM | POA: Insufficient documentation

## 2017-10-09 DIAGNOSIS — Z79899 Other long term (current) drug therapy: Secondary | ICD-10-CM | POA: Diagnosis not present

## 2017-10-09 NOTE — ED Triage Notes (Signed)
Pt in c/o right knee pain, states she was walking up some stairs and her knee gave out, ambulatory to triage

## 2017-10-09 NOTE — ED Provider Notes (Signed)
[facility]  [name] Arrival date & time: 10/09/17  1210  History   Chief Complaint Chief Complaint  Patient presents with  . Knee Pain    HPI Morgan [name] is a 42 y.o. female with a history of migraines and nephrolithiasis who presented to the ED for right knee pain. She reports feeling her right knee lock and like it was going to "give out" yesterday 10/08/17 when walking up stairs. Denies any recent or past traumas, falls or injuries to leg, hip or back. Onset was sudden, not related to any specific activity. Denies fever, swelling, other arthralgias, back pain, paresthesias, foot drop or weakness. Pain is aggravated by going up and down stairs and pivoting. Patient has had no prior knee problems. Patient has been able to ambulate and bear weight without issue. She has tried nothing prior to ED visit.      Past Medical History:  Diagnosis Date  . Abortion 03/09/2013  . Allergy   . Anxiety   . Asthma   . Depression   . Headache   . Kidney stone   . Migraines   . Pregnancy induced hypertension   . Sleep apnea     Patient Active Problem List   Diagnosis Date Noted  . Chronic migraine w/o aura w/o status migrainosus, not intractable 05/31/2015  . Major depressive disorder, recurrent, severe without psychotic features (HCC)   . MDD (major depressive disorder), recurrent episode, severe (HCC) 11/30/2014  . Ureterolithiasis 04/19/2014  . Migraine 03/18/2013  . OSA on CPAP 03/18/2013    Past Surgical History:  Procedure Laterality Date  . CERVICAL CERCLAGE     all 3 pregnancies  . DILATION AND CURETTAGE OF UTERUS  2001   retained placenta  . DILATION AND CURETTAGE OF UTERUS  2006   SAB  . MOUTH SURGERY  1999  . TUBAL LIGATION       OB History    Gravida  7   Para  3   Term  1   Preterm  2   AB  4   Living  1     SAB  2   TAB  2   Ectopic  0   Multiple  0   Live Births  3            Home Medications    Prior to Admission medications   Medication Sig Start Date End Date Taking? Authorizing Provider  Estrogens, Conjugated (PREMARIN VA) Place 1 tablet vaginally at bedtime.    [provider]  ibuprofen (ADVIL,MOTRIN) 200 MG tablet Take 400-800 mg by mouth every 6 (six) hours as needed for headache.    [provider]  ondansetron (ZOFRAN-ODT) 4 MG disintegrating tablet Take 1 tablet (4 mg total) by mouth every 8 (eight) hours as needed for nausea. 08/10/17   Anson FretAhern, Antonia B, MD  PROGESTERONE VA Place 1 capsule vaginally at bedtime.    [provider]  SUMAtriptan Succinate (ONZETRA XSAIL) 11 MG/NOSEPC EXHP Place 1 spray into both nostrils once for 1 dose. Administer 2 nose pieces one per nostril. At onset of migraine can repeat after 2 hours no more than 2 doses in 24 hours 08/10/17 08/10/17  Anson FretAhern, Antonia B, MD    Family History Family History  Problem Relation Age of Onset  . HIV Mother   . Seizures Mother        secondary to drug abuse  . Drug abuse Mother   .  Heart disease Maternal Grandfather   . Diabetes Maternal Grandfather   . Cancer Maternal Grandfather   . Diabetes Paternal Grandmother   . Cancer Paternal Grandfather   . Heart disease Maternal Grandmother   . Hypertension Maternal Grandmother   . Migraines Neg Hx     Social History Social History   Tobacco Use  . Smoking status: Never Smoker  . Smokeless tobacco: Never Used  Substance Use Topics  . Alcohol use: Yes    Alcohol/week: 0.0 standard drinks    Comment: every once in awhile  . Drug use: Yes    Frequency: 7.0 times per week    Types: Marijuana    Comment: last use 09/13/15; as of 08/07/17 still smokes daily     Allergies   Stadol [butorphanol tartrate] and Kale   Review of Systems Review of Systems  Constitutional: Negative for fever.  Musculoskeletal: Positive for arthralgias. Negative for back pain, gait problem, joint swelling and neck pain.   Skin: Negative.   Neurological: Negative for weakness and numbness.  Hematological: Negative.      Physical Exam Updated Vital Signs BP (!) 131/99 (BP Location: Right Arm)   Pulse (!) 59   Temp 98.5 F (36.9 C) (Oral)   Resp 20   SpO2 98%   Physical Exam  Constitutional: Vital signs are normal. She appears well-developed and well-nourished.  Musculoskeletal:       Right hip: Normal.       Left hip: Normal.       Right knee: She exhibits normal range of motion and no swelling. Tenderness found.       Left knee: Normal.       Right ankle: Normal.       Left ankle: Normal.       Legs: Tenderness along distal portion of IT band. Lower extremities have full and active passive ROM with 5/5 strength. No bony tenderness, swelling, erythema or warmth in joint. No ACL/PCL/MCL/LCL laxity of right knee.  Neurological: She has normal strength. She displays no atrophy. No sensory deficit. She exhibits normal muscle tone.  Reflex Scores:      Patellar reflexes are 2+ on the right side and 2+ on the left side.      Achilles reflexes are 2+ on the right side and 2+ on the left side. Skin: Skin is warm and intact. No bruising noted.  Nursing note and vitals reviewed.    ED Treatments / Results  Labs (all labs ordered are listed, but only abnormal results are displayed) Labs Reviewed - No data to display  EKG None  Radiology Dg Knee Complete 4 Views Right  Result Date: 10/09/2017 CLINICAL DATA:  Acute right knee pain. EXAM: RIGHT KNEE - COMPLETE 4+ VIEW COMPARISON:  None. FINDINGS: No evidence of fracture, dislocation, or joint effusion. No evidence of arthropathy or other focal bone abnormality. Soft tissues are unremarkable. IMPRESSION: Normal right knee. Electronically Signed   By: Lupita Raider, M.D.   On: 10/09/2017 13:02    Procedures Procedures (including critical care time)  Medications Ordered in ED Medications - No data to display   Initial Impression / Assessment  and Plan / ED Course  Triage vital signs and the nursing notes have been reviewed.  Pertinent labs & imaging results that were available during care of the patient were reviewed and considered in medical decision making (see chart for details).  Clinical Course as of Oct 09 1309  Thu Oct 09, 2017  1250 Knee  x-ray normal. Normal joint spaces. No fractures or dislocations.   [GM]    Clinical Course User Index [GM] Tillie Viverette, Sharyon Medicus, PA-C   Patient is in no distress and well appearing. Patient has full sensation in lower extremities. Normal gait and balance. She also has full active and passive ROM. No deformities, decreased muscle tone or other abnormalities visualized. Neurovascular function is intact. Physical exam and x-rays are reassuring. Tenderness along distal portion of IT band. No ligamental laxity. There are no other physical exam findings or s/s that suggest an underlying infectious or rheumatologic process that warrant additional evaluation.  Final Clinical Impressions(s) / ED Diagnoses  1. Right Knee Pain. IT band inflammation. Education provided on OTC and supportive treatment for pain relief and inflammation. Education provided on knee exercises that patient can perform to strength supporting muscles of knee.  Dispo: Home. After thorough clinical evaluation, this patient is determined to be medically stable and can be safely discharged with the previously mentioned treatment and/or outpatient follow-up/referral(s). At this time, there are no other apparent medical conditions that require further screening, evaluation or treatment.   Final diagnoses:  Acute pain of right knee    ED Discharge Orders    None        Reva Bores 10/09/17 1312    Raeford Razor, MD 10/12/17 1049

## 2017-10-09 NOTE — ED Notes (Signed)
Patient able to ambulate independently  

## 2017-10-09 NOTE — Discharge Instructions (Addendum)
X-rays are normal today. No fractures or dislocations. Your physical exam does not make me think that there any damages to your major ligaments or meniscus. Like we discussed, this pain is likely from muscle strain.  You may use Tylenol and/or Ibuprofen (or Naproxen) for pain relief and swelling. You may also use warm or cold compresses for additional relief. You may follow-up with your PCP if you continue to have issues for more than 4-6 weeks.

## 2017-12-01 IMAGING — CR DG CHEST 2V
2 series · 2 of 2 positions shown · non-contrast
Comparison: 11/19/2014

CLINICAL DATA: Cough x2 weeks

EXAM:
CHEST  2 VIEW

[chest pa]
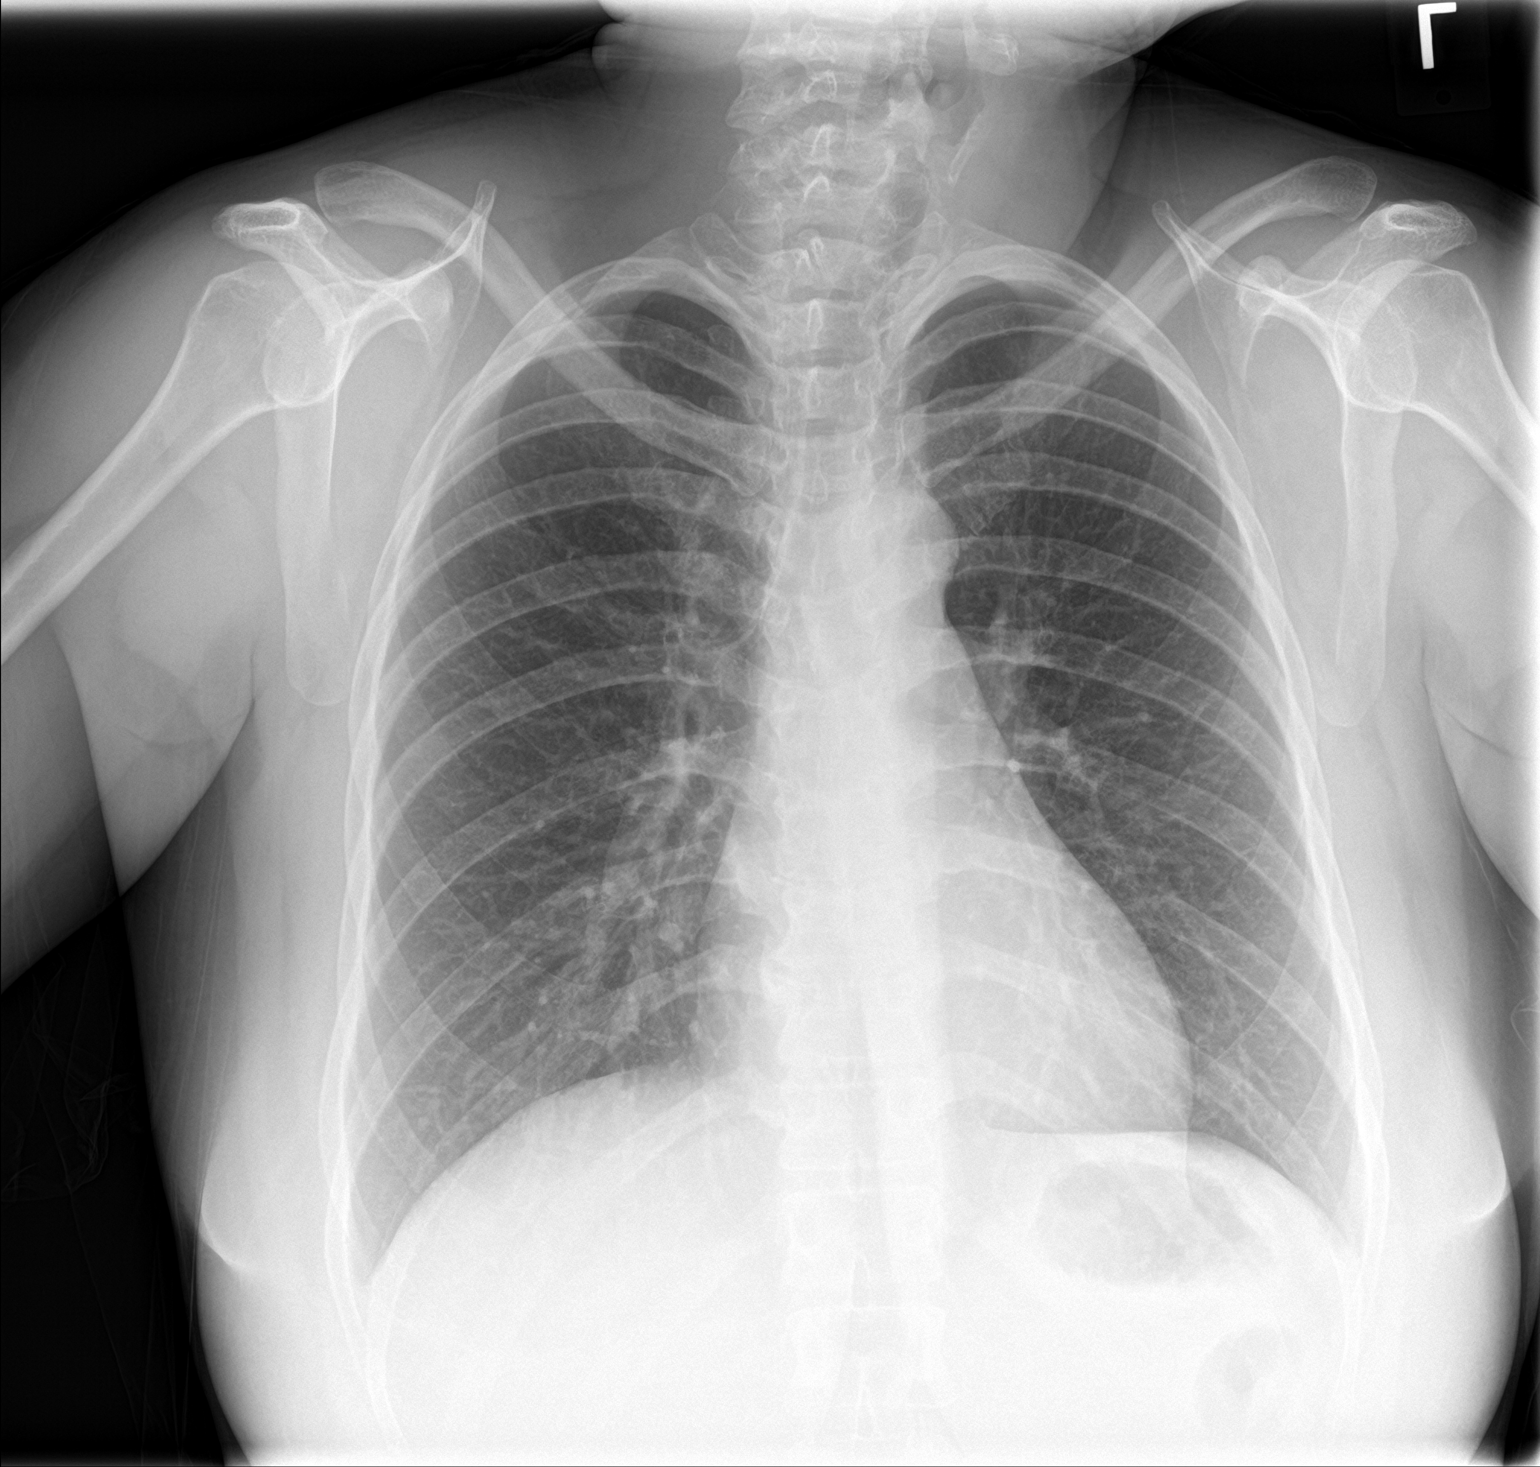

[chest lat]
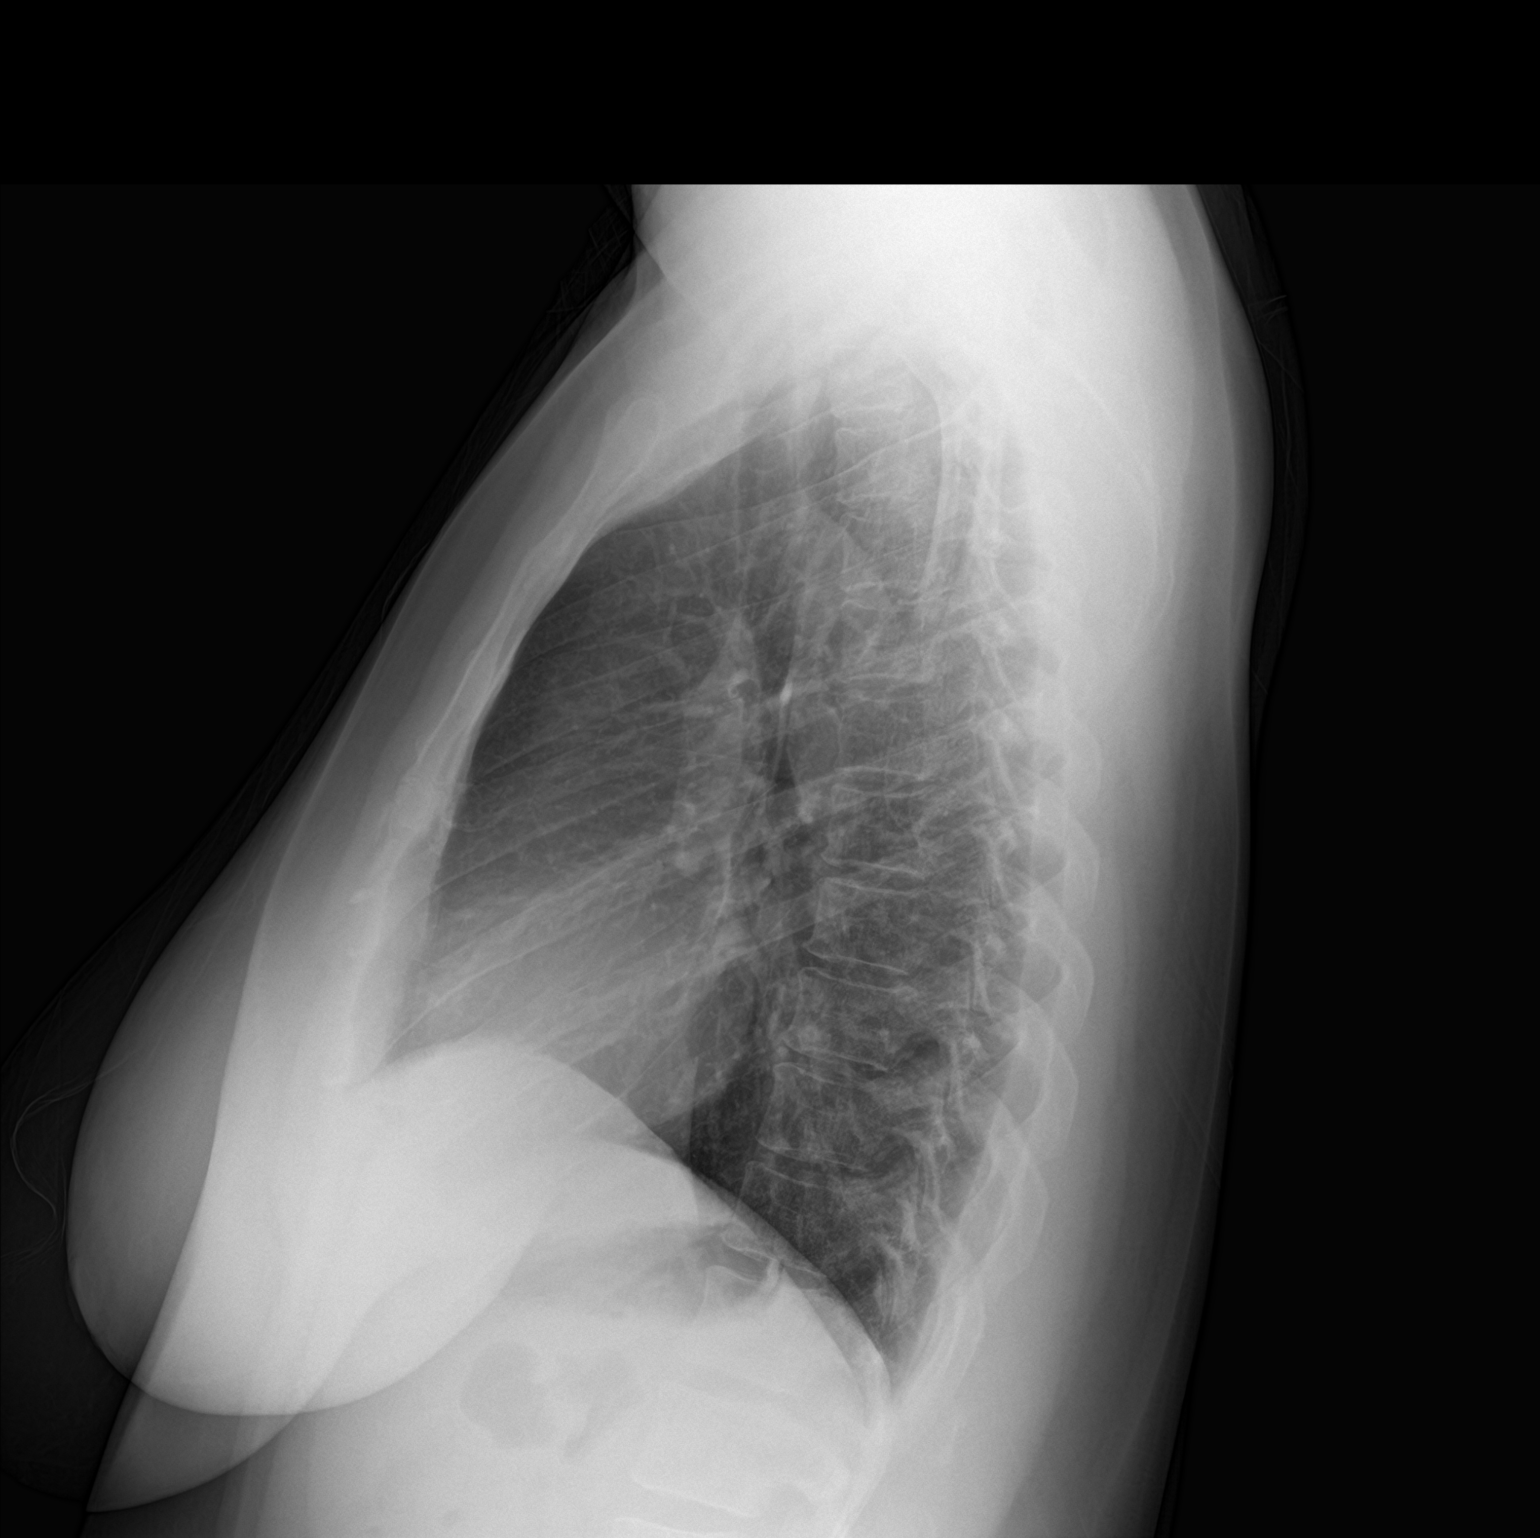

[2 of 2 positions shown; findings below may reference images not displayed]

FINDINGS: Lungs are clear.  No pleural effusion or pneumothorax.

The heart is normal in size.

Visualized osseous structures are within normal limits.
IMPRESSION: Normal chest radiographs.

## 2018-04-16 ENCOUNTER — Other Ambulatory Visit: Payer: Self-pay

## 2018-04-16 ENCOUNTER — Emergency Department (HOSPITAL_COMMUNITY)
Admission: EM | Admit: 2018-04-16 | Discharge: 2018-04-16 | Disposition: A | Discharge status: Home / Self Care | Payer: BLUE CROSS/BLUE SHIELD | Attending: Emergency Medicine | Admitting: Emergency Medicine

## 2018-04-16 DIAGNOSIS — J45909 Unspecified asthma, uncomplicated: Secondary | ICD-10-CM | POA: Insufficient documentation

## 2018-04-16 DIAGNOSIS — Y939 Activity, unspecified: Secondary | ICD-10-CM | POA: Diagnosis not present

## 2018-04-16 DIAGNOSIS — Y929 Unspecified place or not applicable: Secondary | ICD-10-CM | POA: Insufficient documentation

## 2018-04-16 DIAGNOSIS — Y999 Unspecified external cause status: Secondary | ICD-10-CM | POA: Diagnosis not present

## 2018-04-16 DIAGNOSIS — Z23 Encounter for immunization: Secondary | ICD-10-CM | POA: Diagnosis not present

## 2018-04-16 DIAGNOSIS — S01511A Laceration without foreign body of lip, initial encounter: Secondary | ICD-10-CM | POA: Diagnosis not present

## 2018-04-16 DIAGNOSIS — S0181XA Laceration without foreign body of other part of head, initial encounter: Secondary | ICD-10-CM

## 2018-04-16 DIAGNOSIS — Z79899 Other long term (current) drug therapy: Secondary | ICD-10-CM | POA: Insufficient documentation

## 2018-04-16 MED ORDER — TETANUS-DIPHTH-ACELL PERTUSSIS 5-2.5-18.5 LF-MCG/0.5 IM SUSP
0.5000 mL | Freq: Once | INTRAMUSCULAR | Status: AC
  Administered 2018-04-16: 0.5 mL via INTRAMUSCULAR
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
## 2018-04-16 NOTE — Discharge Instructions (Addendum)
Please read attached information. If you experience any new or worsening signs or symptoms please return to the emergency room for evaluation. Please follow-up with your primary care provider or specialist as discussed.  °
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
## 2018-05-04 ENCOUNTER — Telehealth: Payer: Self-pay

## 2018-05-04 ENCOUNTER — Ambulatory Visit (INDEPENDENT_AMBULATORY_CARE_PROVIDER_SITE_OTHER): Payer: BLUE CROSS/BLUE SHIELD | Admitting: Family Medicine

## 2018-05-04 ENCOUNTER — Encounter: Payer: Self-pay | Admitting: Family Medicine

## 2018-05-04 VITALS — BP 124/89 | HR 57 | Ht 65.0 in | Wt 179.4 lb

## 2018-05-04 DIAGNOSIS — G43709 Chronic migraine without aura, not intractable, without status migrainosus: Secondary | ICD-10-CM | POA: Diagnosis not present

## 2018-05-04 MED ORDER — ERENUMAB-AOOE 70 MG/ML ~~LOC~~ SOAJ: 70.0000 mg | SUBCUTANEOUS | 4 refills | Status: DC

## 2018-05-04 MED ORDER — ERENUMAB-AOOE 70 MG/ML ~~LOC~~ SOAJ: 70.0000 mg | SUBCUTANEOUS | 0 refills | Status: DC

## 2018-05-04 MED ORDER — UBROGEPANT 50 MG PO TABS: 50.0000 mg | ORAL_TABLET | Freq: Every day | ORAL | 11 refills | Status: DC | PRN

## 2018-05-04 MED ORDER — KETOROLAC TROMETHAMINE 60 MG/2ML IM SOLN
30.0000 mg | Freq: Once | INTRAMUSCULAR | Status: AC
  Administered 2018-05-04: 30 mg via INTRAMUSCULAR

## 2018-05-04 MED ORDER — UBROGEPANT 50 MG PO TABS: 50.0000 mg | ORAL_TABLET | Freq: Every day | ORAL | 0 refills | Status: DC | PRN

## 2018-05-04 NOTE — Telephone Encounter (Signed)
Pending approval for Aimovig 70 mg  Key: [id number] Rx #: [id number] ICD 10 code: G43.709   I will update once a decision has been made.

## 2018-05-04 NOTE — Progress Notes (Signed)
Personally  participated in, made any corrections needed, and agree with history, physical, neuro exam,assessment and plan as stated.     Antonia Ahern, MD [name]     
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
## 2018-05-04 NOTE — Patient Instructions (Signed)
Start Amovig 70mg  every month (first dose given in office, next dose due 06/04/2018)  Start Ubrelvy 50mg  as needed for abortive therapy (may take 1 tablet at onset of headache, may repeat 1 tablet in 2 hours, no more than 2 tablet in 24 hours)  Stop Onzetra  Avoid regular use of ibuprofen and Excedrin    Analgesic Rebound Headache An analgesic rebound headache, sometimes called a medication overuse headache, is a headache that comes after pain medicine (analgesic) taken to treat the original (primary) headache has worn off. Any type of primary headache can return as a rebound headache if a person regularly takes analgesics more than three times a week to treat it. The types of primary headaches that are commonly associated with rebound headaches include:  Migraines.  Headaches that arise from tense muscles in the head and neck area (tension headaches).  Headaches that develop and happen again (recur) on one side of the head and around the eye (cluster headaches). If rebound headaches continue, they become chronic daily headaches. What are the causes? This condition may be caused by frequent use of:  Over-the-counter medicines such as aspirin, ibuprofen, and acetaminophen.  Sinus relief medicines and other medicines that contain caffeine.  Narcotic pain medicines such as codeine and oxycodone. What are the signs or symptoms? The symptoms of a rebound headache are the same as the symptoms of the original headache. Some of the symptoms of specific types of headaches include: Migraine headache  Pulsing or throbbing pain on one or both sides of the head.  Severe pain that interferes with daily activities.  Pain that is worsened by physical activity.  Nausea, vomiting, or both.  Pain with exposure to bright light, loud noises, or strong smells.  General sensitivity to bright light, loud noises, or strong smells.  Visual changes.  Numbness of one or both arms. Tension  headache  Pressure around the head.  Dull, aching head pain.  Pain felt over the front and sides of the head.  Tenderness in the muscles of the head, neck, and shoulders. Cluster headache  Severe pain that begins in or around one eye or temple.  Redness and tearing in the eye on the same side as the pain.  Droopy or swollen eyelid.  One-sided head pain.  Nausea.  Runny nose.  Sweaty, pale facial skin.  Restlessness. How is this diagnosed? This condition is diagnosed by:  Reviewing your medical history. This includes the nature of your primary headaches.  Reviewing the types of pain medicines that you have been using to treat your headaches and how often you take them. How is this treated? This condition may be treated or managed by:  Discontinuing frequent use of the analgesic medicine. Doing this may worsen your headaches at first, but the pain should eventually become more manageable, less frequent, and less severe.  Seeing a headache specialist. He or she may be able to help you manage your headaches and help make sure there is not another cause of the headaches.  Using methods of stress relief, such as acupuncture, counseling, biofeedback, and massage. Talk with your health care provider about which methods might be good for you. Follow these instructions at home:  Take over-the-counter and prescription medicines only as told by your health care provider.  Stop the repeated use of pain medicine as told by your health care provider. Stopping can be difficult. Carefully follow instructions from your health care provider.  Avoid triggers that are known to cause your primary  headaches.  Keep all follow-up visits as told by your health care provider. This is important. Contact a health care provider if:  You continue to experience headaches after following treatments that your health care provider recommended. Get help right away if:  You develop new headache  pain.  You develop headache pain that is different than what you have experienced in the past.  You develop numbness or tingling in your arms or legs.  You develop changes in your speech or vision. This information is not intended to replace advice given to you by your health care provider. Make sure you discuss any questions you have with your health care provider. Document Released: 05/11/2003 Document Revised: 09/08/2015 Document Reviewed: 07/24/2015 Elsevier Interactive Patient Education  2019 [employer] Migraine Headache  A migraine headache is a very strong throbbing pain on one side or both sides of your head. Migraines can also cause other symptoms. Talk with your doctor about what things may bring on (trigger) your migraine headaches. Follow these instructions at home: Medicines  Take over-the-counter and prescription medicines only as told by your doctor.  Do not drive or use heavy machinery while taking prescription pain medicine.  To prevent or treat constipation while you are taking prescription pain medicine, your doctor may recommend that you: ? Drink enough fluid to keep your pee (urine) clear or pale yellow. ? Take over-the-counter or prescription medicines. ? Eat foods that are high in fiber. These include fresh fruits and vegetables, whole grains, and beans. ? Limit foods that are high in fat and processed sugars. These include fried and sweet foods. Lifestyle  Avoid alcohol.  Do not use any products that contain nicotine or tobacco, such as cigarettes and e-cigarettes. If you need help quitting, ask your doctor.  Get at least 8 hours of sleep every night.  Limit your stress. General instructions   Keep a journal to find out what may bring on your migraines. For example, write down: ? What you eat and drink. ? How much sleep you get. ? Any change in what you eat or drink. ? Any change in your medicines.  If you have a migraine: ? Avoid things that  make your symptoms worse, such as bright lights. ? It may help to lie down in a dark, quiet room. ? Do not drive or use heavy machinery. ? Ask your doctor what activities are safe for you.  Keep all follow-up visits as told by your doctor. This is important. Contact a doctor if:  You get a migraine that is different or worse than your usual migraines. Get help right away if:  Your migraine gets very bad.  You have a fever.  You have a stiff neck.  You have trouble seeing.  Your muscles feel weak or like you cannot control them.  You start to lose your balance a lot.  You start to have trouble walking.  You pass out (faint). This information is not intended to replace advice given to you by your health care provider. Make sure you discuss any questions you have with your health care provider. Document Released: 11/28/2007 Document Revised: 11/12/2017 Document Reviewed: 08/07/2015 Elsevier Interactive Patient Education  2019 [employer].

## 2018-05-05 ENCOUNTER — Telehealth: Payer: Self-pay

## 2018-05-05 NOTE — Telephone Encounter (Signed)
Received a letter of approval from [facility] for Aimovig 70 mg.   Approval 05-04-2018 through 08-04-2018. Approval letter has been faxed to the pharmacy. Confirmation fax has been received.

## 2018-05-05 NOTE — Telephone Encounter (Signed)
Received a form from the pharmacy stating that Morgan [name] wouldn't be covered due it being a formulary drug. Form has been faxed back stating that patient has a copay card that will override a prior authorization.    [id number] [place], [place] - [address] [phone] (Phone) [phone] (Fax)   Confirmation fax has been received.

## 2018-05-18 ENCOUNTER — Encounter (HOSPITAL_COMMUNITY): Payer: Self-pay | Admitting: *Deleted

## 2018-05-18 ENCOUNTER — Emergency Department (HOSPITAL_COMMUNITY): Payer: BLUE CROSS/BLUE SHIELD

## 2018-05-18 ENCOUNTER — Other Ambulatory Visit: Payer: Self-pay

## 2018-05-18 ENCOUNTER — Emergency Department (HOSPITAL_COMMUNITY)
Admission: EM | Admit: 2018-05-18 | Discharge: 2018-05-18 | Disposition: A | Discharge status: Home / Self Care | Payer: BLUE CROSS/BLUE SHIELD | Attending: Emergency Medicine | Admitting: Emergency Medicine

## 2018-05-18 DIAGNOSIS — Y939 Activity, unspecified: Secondary | ICD-10-CM | POA: Diagnosis not present

## 2018-05-18 DIAGNOSIS — J45909 Unspecified asthma, uncomplicated: Secondary | ICD-10-CM | POA: Diagnosis not present

## 2018-05-18 DIAGNOSIS — Y998 Other external cause status: Secondary | ICD-10-CM | POA: Diagnosis not present

## 2018-05-18 DIAGNOSIS — Z79899 Other long term (current) drug therapy: Secondary | ICD-10-CM | POA: Insufficient documentation

## 2018-05-18 DIAGNOSIS — S0993XA Unspecified injury of face, initial encounter: Secondary | ICD-10-CM | POA: Diagnosis present

## 2018-05-18 DIAGNOSIS — Y929 Unspecified place or not applicable: Secondary | ICD-10-CM | POA: Insufficient documentation

## 2018-05-18 DIAGNOSIS — S0083XA Contusion of other part of head, initial encounter: Secondary | ICD-10-CM | POA: Insufficient documentation

## 2018-05-18 DIAGNOSIS — R51 Headache: Secondary | ICD-10-CM | POA: Insufficient documentation

## 2018-05-18 MED ORDER — ACETAMINOPHEN 500 MG PO TABS
1000.0000 mg | ORAL_TABLET | Freq: Once | ORAL | Status: AC
  Administered 2018-05-18: 1000 mg via ORAL
  Filled 2018-05-18: qty 2

## 2018-05-18 NOTE — ED Triage Notes (Signed)
During assessment pt reported her mouth hurt when opening for temp.
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
## 2018-05-18 NOTE — Discharge Instructions (Addendum)
You were seen in the ER for left-sided facial/eyebrow swelling, bruising and pain.  Imaging today is unremarkable other than some mild soft tissue swelling.  There are no underlying fractures.  Alternate ibuprofen and acetaminophen at home for pain, swelling and inflammation.  Ice.  Elevate your head.  Return to the ER if there is any severe, sudden headaches, vision changes or loss, difficulty with speech or balance, dizziness, passing out episodes, one-sided loss of sensation weakness or drooping.  Given head trauma, you are at risk for developing concussion symptoms. Concussion symptoms include include headache, nausea, sleep disturbance, dizziness, short term memory loss. Usually these symptom improve slowly over a period of 2-3 weeks, usually sooner. These symptoms wax and wane, and can be worsened by physical or cognitive exertion like reading, watching Tv, studying, using computer, playing video games. We recommend physical and cognitive rest for the next 48-72 hours. This means avoid any exertional or cognitive activity that makes your symptoms worse including reading, playing video games, watching TV, exercising, jumping, playing sports.  The most important part of concussion management is avoiding a repeat head injury, do not perform any activities that will put you at risk for a second head injury. You must be cleared by a clinician before fully returning to sports. If your symptoms last longer than 2-3 week you need to further evaluation by primary care provider or concussion specialist.
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
## 2018-05-18 NOTE — ED Notes (Signed)
Patient verbalizes understanding of discharge instructions . Opportunity for questions and answers were provided . Armband removed by staff ,Pt discharged from ED. W/C  offered at D/C  and Declined W/C at D/C and was escorted to lobby by RN.  

## 2018-05-18 NOTE — ED Notes (Signed)
Visual acuity Distant equivalent 20FT.  Right eye 20/40 Left eye 20/40 Bilateral eyes 20/20

## 2018-08-20 ENCOUNTER — Telehealth: Payer: Self-pay

## 2018-08-20 NOTE — Telephone Encounter (Signed)
Received paperwork to do a PA for Aimovig per covermymeds the PA has been resolved.

## 2018-08-25 ENCOUNTER — Telehealth: Payer: BLUE CROSS/BLUE SHIELD | Admitting: Physician Assistant

## 2018-08-25 DIAGNOSIS — M791 Myalgia, unspecified site: Secondary | ICD-10-CM

## 2018-08-25 NOTE — Progress Notes (Signed)
E-Visit for Corona Virus Screening   Your current symptoms could be consistent with the coronavirus.  Call your health care provider or local health department to request and arrange formal testing. Many health care providers can now test patients at their office but not all are.  Please quarantine yourself while awaiting your test results.  [place] [phone], [place], [place] [phone] or visit [email or web address]  and You have been enrolled in [facility] for COVID-19.  Daily you will receive a questionnaire within the [facility] website. Our COVID-19 response team will be monitoring your responses daily.    COVID-19 is a respiratory illness with symptoms that aresimilar to the flu. Symptoms are typically mild to moderate, but there have been cases of severe illness and death due to the virus. The following symptoms may appear 2-14 days after exposure: Fever Cough Shortness of breath or difficulty breathing Chills Repeated shaking with chills Muscle pain Headache Sore throat New loss of taste or smell Fatigue Congestion or runny nose Nausea or vomiting Diarrhea  It is vitally important that if you feel that you have an infection such as this virus or any other virus that you stay home and away from places where you may spread it to others.  You should self-quarantine for 14 days if you have symptoms that could potentially be coronavirus or have been in close contact a with a person diagnosed with COVID-19 within the last 2 weeks. You should avoid contact with people age 32 and older.   You should wear a mask or cloth face covering over your nose and mouth if you must be around other people or animals, including pets (even at home). Try to stay at least 6 feet away from other people. This will protect the people around you.   You  may also take acetaminophen (Tylenol) as needed for fever.   Reduce your risk of any infection by using the same precautions used for avoiding the common cold or flu: Wash your hands often with soap and warm water for at least 20 seconds.  If soap and water are not readily available, use an alcohol-based hand sanitizer with at least 60% alcohol.  If coughing or sneezing, cover your mouth and nose by coughing or sneezing into the elbow areas of your shirt or coat, into a tissue or into your sleeve (not your hands). Avoid shaking hands with others and consider head nods or verbal greetings only.  Avoid touching your eyes,nose, or mouth with unwashed hands. Avoid close contact with people who are sick. Avoid places or events with large numbers of people in one location, like concerts or sporting events. Carefully consider travel plans you have or are making. If you are planning any travel outside or inside the [place], visit the CDC'sTravelers' Health webpagefor the latest health notices. If you have some symptoms but not all symptoms, continue to monitor at home and seek medical attention if your symptoms worsen. If you are having a medical emergency, call 911.  HOME CARE Only take medications as instructed by your medical team. Drink plenty of fluids and get plenty of rest. A steam or ultrasonic humidifier can help if you have congestion.   GET HELP RIGHT AWAY IF YOU HAVE EMERGENCY WARNING SIGNS** FOR COVID-19. If you or someone is showing any of these signs seek emergency medical care immediately. Call 911 or proceed to your closest emergency facility if: You develop worsening high fever. Trouble breathing  Bluish lips or face Persistent pain or pressure in the chest New confusion Inability to wake or stay awake You cough up blood. Your symptoms become more severe  **This list is not all possible symptoms. Contact your medical provider for any symptoms that are sever or concerning to  you.   MAKE SURE YOU  Understand these instructions. Will watch your condition. Will get help right away if you are not doing well or get worse.  Your e-visit answers were reviewed by a board certified advanced clinical practitioner to complete your personal care plan.  Depending on the condition, your plan could have included both over the counter or prescription medications.  If there is a problem please reply once you have received a response from your provider.  Your safety is important to us.  If you have drug allergies check your prescription carefully.    You can use MyChart to ask questions about today's visit, request a non-urgent call back, or ask for a work or school excuse for 24 hours related to this e-Visit. If it has been greater than 24 hours you will need to follow up with your provider, or enter a new e-Visit to address those concerns. You will get an e-mail in the next two days asking about your experience.  I hope that your e-visit has been valuable and will speed your recovery. Thank you for using e-visits.    ===View-only below this line===   ----- Message -----    From: Glade StanfordMonique Price-Cole    Sent: 08/25/2018 10:09 AM EDT      To: E-Visit Mailing List Subject: CoronaVirus (COVID-19) Screening  CoronaVirus (COVID-19) Screening --------------------------------  Question: Do you have any of the following?  Answer:   Shortness of breath  Question: If you are experiencing trouble breathing please select the severity of this:  Answer:   I am not having any trouble breathing (N/A)  Question: Do you have any of the following additional symptoms?  Answer:   Sore throat            Body aches  Question: Have you had a fever? Answer:   No  Question: Have others in your home or workplace had similar symptoms? Answer:   No  Question: When did your symptoms start? Answer:   Monday  Question: Have you recently visited any of the following countries? Answer:    None of these  Question: If you have traveled anywhere in the last  2 months please document where you have visited: Answer:     Question: Have you recently been around others from these countries or visited these countries who have had coughing or fever? Answer:   No  Question: Have you recently been around anyone who has been diagnosed with Corona virus? Answer:   Yes  Question: Have you been taking any medications? Answer:   No  Question: If taking medications for these symptoms, please list the names and whether they are helping or not Answer:     Question: Are you treated for any of the following conditions: Asthma, COPD, Diabetes, Renal Failure (on Dialysis), AIDS, any Neuromuscular disease that effects the clearing of secretions, Heart Failure, or Heart Disease? Answer:   No  Question: Please enter a phone number where you can be reached if we have additional questions about your symptoms Answer:   [phone]  Question: Please list your medication allergies that you may have ? (If 'none' , please list as 'none') Answer:   Stadol  Question:  Please list any additional comments  Answer:     Question: Are you pregnant? Answer:   I am confident that I am not pregnant  Question: Are you breastfeeding? Answer:   No  A total of 5-10 minutes was spent evaluating this patients questionnaire and formulating a plan of care.

## 2018-11-04 ENCOUNTER — Ambulatory Visit: Payer: BLUE CROSS/BLUE SHIELD | Admitting: Family Medicine
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
## 2018-11-10 ENCOUNTER — Ambulatory Visit: Payer: BC Managed Care – PPO | Admitting: Family Medicine
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
## 2018-11-11 ENCOUNTER — Encounter: Payer: Self-pay | Admitting: Family Medicine

## 2018-11-11 ENCOUNTER — Ambulatory Visit: Payer: BC Managed Care – PPO | Admitting: Family Medicine

## 2018-11-11 ENCOUNTER — Other Ambulatory Visit: Payer: Self-pay

## 2018-11-11 VITALS — BP 144/103 | HR 59 | Temp 97.5°F | Ht 65.0 in | Wt 199.2 lb

## 2018-11-11 DIAGNOSIS — G4733 Obstructive sleep apnea (adult) (pediatric): Secondary | ICD-10-CM | POA: Diagnosis not present

## 2018-11-11 DIAGNOSIS — G43709 Chronic migraine without aura, not intractable, without status migrainosus: Secondary | ICD-10-CM

## 2018-11-11 DIAGNOSIS — I10 Essential (primary) hypertension: Secondary | ICD-10-CM | POA: Diagnosis not present

## 2018-11-11 NOTE — Patient Instructions (Addendum)
Continue Amovig monthly  Ubrelvy for abortive therapy  Sleep referral, PCP referral   Follow up with me 1 year  Migraine Headache A migraine headache is a very strong throbbing pain on one side or both sides of your head. This type of headache can also cause other symptoms. It can last from 4 hours to 3 days. Talk with your doctor about what things may bring on (trigger) this condition. What are the causes? The exact cause of this condition is not known. This condition may be triggered or caused by:  Drinking alcohol.  Smoking.  Taking medicines, such as: ? Medicine used to treat chest pain (nitroglycerin). ? Birth control pills. ? Estrogen. ? Some blood pressure medicines.  Eating or drinking certain products.  Doing physical activity. Other things that may trigger a migraine headache include:  Having a menstrual period.  Pregnancy.  Hunger.  Stress.  Not getting enough sleep or getting too much sleep.  Weather changes.  Tiredness (fatigue). What increases the risk?  Being 60-64 years old.  Being female.  Having a family history of migraine headaches.  Being Caucasian.  Having depression or anxiety.  Being very overweight. What are the signs or symptoms?  A throbbing pain. This pain may: ? Happen in any area of the head, such as on one side or both sides. ? Make it hard to do daily activities. ? Get worse with physical activity. ? Get worse around bright lights or loud noises.  Other symptoms may include: ? Feeling sick to your stomach (nauseous). ? Vomiting. ? Dizziness. ? Being sensitive to bright lights, loud noises, or smells.  Before you get a migraine headache, you may get warning signs (an aura). An aura may include: ? Seeing flashing lights or having blind spots. ? Seeing bright spots, halos, or zigzag lines. ? Having tunnel vision or blurred vision. ? Having numbness or a tingling feeling. ? Having trouble talking. ? Having weak  muscles.  Some people have symptoms after a migraine headache (postdromal phase), such as: ? Tiredness. ? Trouble thinking (concentrating). How is this treated?  Taking medicines that: ? Relieve pain. ? Relieve the feeling of being sick to your stomach. ? Prevent migraine headaches.  Treatment may also include: ? Having acupuncture. ? Avoiding foods that bring on migraine headaches. ? Learning ways to control your body functions (biofeedback). ? Therapy to help you know and deal with negative thoughts (cognitive behavioral therapy). Follow these instructions at home: Medicines  Take over-the-counter and prescription medicines only as told by your doctor.  Ask your doctor if the medicine prescribed to you: ? Requires you to avoid driving or using heavy machinery. ? Can cause trouble pooping (constipation). You may need to take these steps to prevent or treat trouble pooping:  Drink enough fluid to keep your pee (urine) pale yellow.  Take over-the-counter or prescription medicines.  Eat foods that are high in fiber. These include beans, whole grains, and fresh fruits and vegetables.  Limit foods that are high in fat and sugar. These include fried or sweet foods. Lifestyle  Do not drink alcohol.  Do not use any products that contain nicotine or tobacco, such as cigarettes, e-cigarettes, and chewing tobacco. If you need help quitting, ask your doctor.  Get at least 8 hours of sleep every night.  Limit and deal with stress. General instructions      Keep a journal to find out what may bring on your migraine headaches. For example, write down: ?  What you eat and drink. ? How much sleep you get. ? Any change in what you eat or drink. ? Any change in your medicines.  If you have a migraine headache: ? Avoid things that make your symptoms worse, such as bright lights. ? It may help to lie down in a dark, quiet room. ? Do not drive or use heavy machinery. ? Ask your  doctor what activities are safe for you.  Keep all follow-up visits as told by your doctor. This is important. Contact a doctor if:  You get a migraine headache that is different or worse than others you have had.  You have more than 15 headache days in one month. Get help right away if:  Your migraine headache gets very bad.  Your migraine headache lasts longer than 72 hours.  You have a fever.  You have a stiff neck.  You have trouble seeing.  Your muscles feel weak or like you cannot control them.  You start to lose your balance a lot.  You start to have trouble walking.  You pass out (faint).  You have a seizure. Summary  A migraine headache is a very strong throbbing pain on one side or both sides of your head. These headaches can also cause other symptoms.  This condition may be treated with medicines and changes to your lifestyle.  Keep a journal to find out what may bring on your migraine headaches.  Contact a doctor if you get a migraine headache that is different or worse than others you have had.  Contact your doctor if you have more than 15 headache days in a month. This information is not intended to replace advice given to you by your health care provider. Make sure you discuss any questions you have with your health care provider. Document Released: 11/28/2007 Document Revised: 06/12/2018 Document Reviewed: 04/02/2018 Elsevier Patient Education  2020 [employer].

## 2018-11-14 NOTE — Progress Notes (Signed)
Made any corrections needed, and agree with history, physical, neuro exam,assessment and plan as stated.     Antonia Ahern, MD [name]  

## 2018-11-30 ENCOUNTER — Ambulatory Visit: Payer: BC Managed Care – PPO | Admitting: Neurology

## 2018-11-30 ENCOUNTER — Other Ambulatory Visit: Payer: Self-pay

## 2018-11-30 ENCOUNTER — Encounter: Payer: Self-pay | Admitting: Neurology

## 2018-11-30 VITALS — BP 129/84 | HR 62 | Temp 97.5°F | Ht 65.0 in | Wt 199.0 lb

## 2018-11-30 DIAGNOSIS — R110 Nausea: Secondary | ICD-10-CM | POA: Diagnosis not present

## 2018-11-30 DIAGNOSIS — M2619 Other specified anomalies of jaw-cranial base relationship: Secondary | ICD-10-CM

## 2018-11-30 DIAGNOSIS — G4733 Obstructive sleep apnea (adult) (pediatric): Secondary | ICD-10-CM

## 2018-11-30 DIAGNOSIS — I158 Other secondary hypertension: Secondary | ICD-10-CM

## 2018-11-30 DIAGNOSIS — G43709 Chronic migraine without aura, not intractable, without status migrainosus: Secondary | ICD-10-CM

## 2018-11-30 DIAGNOSIS — Z9989 Dependence on other enabling machines and devices: Secondary | ICD-10-CM

## 2018-11-30 NOTE — Progress Notes (Signed)
SLEEP MEDICINE CLINIC    Provider:  Melvyn Novas, MD  Primary Care Physician:  Patient, No Pcp Per No address on file     Referring Provider: Allena Napoleon [address] [place],  [place] [address]          Chief Complaint according to patient   Patient presents with:    . New Patient (Initial Visit)           HISTORY OF PRESENT ILLNESS:  Morgan [name] is a 43 y.o. year old Black or [place] American female patient seen here as a referral on 11/30/2018 from Amy Lomax-NP.  Chief concern according to patient : pt states that she has had PSG in past and used a CPAP. not used since 2015. Her husband has a CPAP, too.  pt is seen her for in general for migraine management, referred by Lucia Gaskins MD, and Nicholas Lose, NP.  She reports being rather drowsy thansleepy, sometimes as if in trance. She missed 3 exits of the highway, driving straight- but wasn't sleepy   I have the pleasure of seeing Morgan [name] today, a left -handed African American female with a possible sleep disorder. She  has a past medical history of medical nessestated Abortion (03/09/2013) with tubal ligation , Allergy, Anxiety, Asthma, Depression, Headache, Kidney stone, vascular headaches, Migraines, Pregnancy induced malignant hypertension, ADD/ ADHD, and Sleep apnea..    The patient had the first sleep study in the year 2009  with a result of OSA, started on CPAP- and never had follow up.    Family medical /sleep history: Nephews and nieces , other family member with OSA. Nephew has asthma and it stopped after tonsil and adenoidectomy. Daughter snores, 45 years old , has fragmented sleep.    Social history:  Patient is married - now separated-one daughter , 60 years old in 2020. She working as a Conservator, museum/gallery for [employer], part time, and lives in a household with 2 persons.  She also takes care of 6 other children , age 50- 2 month - her sister is incarcerated. The patient currently works 28 hours  And participates  in remote studies. Pets are not present. Tobacco use- former.  ETOH use; none , Caffeine intake in form of Coffee( 1 a day) Soda( dr pepper 2 / week ) Tea (none) nor energy drinks. Regular exercise in form of walking.      Sleep habits are as follows: The patient's dinner time is between 6-7.30 PM. The patient goes to bed at 9 PM and sleeps after 9.20-10 PM. She continues to sleep for a total of 6 hours, wakes many times- unknown causes, triggers- TV used to be on- she changed that, it's not bathroom breaks. She wakes  the first time at midnight, 2-3 and again 4  AM.   The preferred sleep position is sideways, with the support of 1-2 pillows. Dreams are reportedly frequent/vivid.  7  AM is the usual rise time. The patient wakes up spontaneously. She leaves work earlier many days because of sleepines and fatigue.   She reports not feeling refreshed or restored in AM, often with symptoms such as dry mouth, morning headaches, and residual fatigue.  Naps are taken infrequently, lasting from 10 to 30 minutes.    Review of Systems: Out of a complete 14 system review, the patient complains of only the following symptoms, and all other reviewed systems are negative.:  Fatigue, sleepiness , snoring, fragmented sleep, Insomnia  SUBJECTIVE:  Morgan  [name] is a 43 y.o. female who complains of migraine headache for years.  She has a well established history of recurrent migraines. worsening after pregnancy.   Depression.  no sleep paralysis, vivid dreams - but not nightmarish, deja-vu.  Seizures ?   How likely are you to doze in the following situations: 0 = not likely, 1 = slight chance, 2 = moderate chance, 3 = high chance  Sitting and Reading? Watching Television? Sitting inactive in a public place (theater or meeting)? Lying down in the afternoon when circumstances permit? Sitting and talking to someone? Sitting quietly after lunch without alcohol? In a car, while stopped for a few minutes  in traffic? As a passenger in a car for an hour without a break?  Total = 16/ 24   FSS 38/ 63     Current Outpatient Medications  Medication Sig Dispense Refill  . Erenumab-aooe 70 MG/ML SOAJ Inject 70 mg into the skin every 30 (thirty) days. 3 pen 4  . estrogens, conjugated, (PREMARIN) 0.625 MG tablet Take 0.625 mg by mouth daily. Take daily for 21 days then do not take for 7 days.    Marland Kitchen ibuprofen (ADVIL,MOTRIN) 200 MG tablet Take 400-800 mg by mouth every 6 (six) hours as needed for headache.    . losartan (COZAAR) 25 MG tablet Take 25 mg by mouth daily.    . ondansetron (ZOFRAN-ODT) 4 MG disintegrating tablet Take 1 tablet (4 mg total) by mouth every 8 (eight) hours as needed for nausea. 60 tablet 11  . progesterone (PROMETRIUM) 100 MG capsule Take 100 mg by mouth daily.    Marland Kitchen Ubrogepant (UBRELVY) 50 MG TABS Take 50 mg by mouth daily as needed. Take one tablet at onset of headache, may repeat 1 tablet in 2 hours, no more than 2 tablets in 24 hours 10 tablet 11   No current facility-administered medications for this visit.     There are no associated abnormal neurological symptoms such as TIA's, loss of balance, loss of vision or speech, numbness or weakness on review. Past neurological history: negative for stroke, MS, epilepsy, or brain tumor.   OBJECTIVE:  Patient appears in pain, preferring to lie in a darkened room. Her vitals are normal. Alert and oriented x 3.  Ears and throat normal. Neck fully supple without nodes. Sinuses non tender. Cranial nerves are normal.  Fundi are normal with sharp disc margins, no papilledema, hemorrhages or exudates noted. DTR's normal and symmetric. Babinski sign absent.  Mental status normal. Cerebellar function normal.   ASSESSMENT:  Migraine headache  PLAN:  Treatment today -  see orders as documented in the electronic medical record. ROV prn if pain does not resolve after treatment., nausea,    How likely are you to doze in the following  situations: 0 = not likely, 1 = slight chance, 2 = moderate chance, 3 = high chance   Sitting and Reading? Watching Television? Sitting inactive in a public place (theater or meeting)? As a passenger in a car for an hour without a break? Lying down in the afternoon when circumstances permit? Sitting and talking to someone? Sitting quietly after lunch without alcohol? In a car, while stopped for a few minutes in traffic?   Total = 16/ 24 points   FSS endorsed at 38/ 63 points.   Social History   Socioeconomic History  . Marital status: Legally Separated    Spouse name: [name]  . Number of children: 1  . Years of education:  12  . Highest education level: Not on file  Occupational History  . Occupation: Development worker, communitypackage handler    Employer: [employer]  Social Needs  . Financial resource strain: Not on file  . Food insecurity    Worry: Not on file    Inability: Not on file  . Transportation needs    Medical: Not on file    Non-medical: Not on file  Tobacco Use  . Smoking status: Never Smoker  . Smokeless tobacco: Never Used  Substance and Sexual Activity  . Alcohol use: Yes    Alcohol/week: 0.0 standard drinks    Comment: every once in awhile  . Drug use: Yes    Frequency: 7.0 times per week    Types: Marijuana    Comment: last use 09/13/15; as of 08/07/17 still smokes daily  . Sexual activity: Yes    Partners: Male    Birth control/protection: None  Lifestyle  . Physical activity    Days per week: Not on file    Minutes per session: Not on file  . Stress: Not on file  Relationships  . Social Musicianconnections    Talks on phone: Not on file    Gets together: Not on file    Attends religious service: Not on file    Active member of club or organization: Not on file    Attends meetings of clubs or organizations: Not on file    Relationship status: Not on file  Other Topics Concern  . Not on file  Social History Narrative   Lives with her daughter (born 2007).   Drinks caffeine a  few times a week, not daily   Left handed    Family History  Problem Relation Age of Onset  . HIV Mother   . Seizures Mother        secondary to drug abuse  . Drug abuse Mother   . Heart disease Maternal Grandfather   . Diabetes Maternal Grandfather   . Cancer Maternal Grandfather   . Diabetes Paternal Grandmother   . Cancer Paternal Grandfather   . Heart disease Maternal Grandmother   . Hypertension Maternal Grandmother   . Migraines Neg Hx     Past Medical History:  Diagnosis Date  . Abortion 03/09/2013  . Allergy   . Anxiety   . Asthma   . Depression   . Headache   . Kidney stone   . Migraines   . Pregnancy induced hypertension   . Sleep apnea     Past Surgical History:  Procedure Laterality Date  . CERVICAL CERCLAGE     all 3 pregnancies  . DILATION AND CURETTAGE OF UTERUS  2001   retained placenta  . DILATION AND CURETTAGE OF UTERUS  2006   SAB  . MOUTH SURGERY  1999  . TUBAL LIGATION       Current Outpatient Medications on File Prior to Visit  Medication Sig Dispense Refill  . Erenumab-aooe 70 MG/ML SOAJ Inject 70 mg into the skin every 30 (thirty) days. 3 pen 4  . estrogens, conjugated, (PREMARIN) 0.625 MG tablet Take 0.625 mg by mouth daily. Take daily for 21 days then do not take for 7 days.    Marland Kitchen. ibuprofen (ADVIL,MOTRIN) 200 MG tablet Take 400-800 mg by mouth every 6 (six) hours as needed for headache.    . losartan (COZAAR) 25 MG tablet Take 25 mg by mouth daily.    . ondansetron (ZOFRAN-ODT) 4 MG disintegrating tablet Take 1 tablet (4 mg total)  by mouth every 8 (eight) hours as needed for nausea. 60 tablet 11  . progesterone (PROMETRIUM) 100 MG capsule Take 100 mg by mouth daily.    Marland Kitchen Ubrogepant (UBRELVY) 50 MG TABS Take 50 mg by mouth daily as needed. Take one tablet at onset of headache, may repeat 1 tablet in 2 hours, no more than 2 tablets in 24 hours 10 tablet 11   No current facility-administered medications on file prior to visit.      Allergies  Allergen Reactions  . Stadol [Butorphanol Tartrate] Itching  . Kale Itching and Rash    Physical exam:  Today's Vitals   11/30/18 0906  BP: 129/84  Pulse: 62  Temp: (!) 97.5 F (36.4 C)  Weight: 199 lb (90.3 kg)  Height: 5\' 5"  (1.651 m)   Body mass index is 33.12 kg/m.   Wt Readings from Last 3 Encounters:  11/30/18 199 lb (90.3 kg)  11/11/18 199 lb 3.2 oz (90.4 kg)  05/18/18 179 lb 7.3 oz (81.4 kg)     Ht Readings from Last 3 Encounters:  11/30/18 5\' 5"  (1.651 m)  11/11/18 5\' 5"  (1.651 m)  05/18/18 5\' 5"  (1.651 m)      General: The patient is awake, alert and appears not in acute distress. The patient is well groomed. Head: Normocephalic, atraumatic. Neck is supple. Mallampati 4, lateral crowding, retrognathia.  neck circumference:14.5  inches . Nasal airflow  patent.  Dental status: native  Cardiovascular:  Regular rate and cardiac rhythm by pulse,  without distended neck veins. Respiratory: Lungs are clear to auscultation.  Skin:  Without evidence of ankle edema, or rash. Trunk: The patient's posture is erect.   Neurologic exam : The patient is awake and alert, oriented to place and time.   Memory subjective described as intact.  Attention span & concentration ability appears normal.  Speech is fluent,  without  dysarthria, dysphonia or aphasia.  Mood and affect are appropriate.   Cranial nerves: no loss of smell or taste reported  Pupils are equal and briskly reactive to light. Funduscopic exam deferred. .  Extraocular movements in vertical and horizontal planes were intact and without nystagmus. No Diplopia. Visual fields by finger perimetry are intact. Hearing was intact to soft voice and finger rubbing.    Facial sensation intact to fine touch.  Facial motor strength is symmetric and tongue and uvula move midline.  Neck ROM : rotation, tilt and flexion extension were normal for age and shoulder shrug was symmetrical.    Motor exam:  Symmetric  bulk, tone and ROM.   Normal tone without cog wheeling, symmetric grip strength .   Sensory: numbness in sleep- waing up with fingers being numb, but often her whole arm.  Fine touch, pinprick and vibration were tested  and  normal.  Proprioception tested in the upper extremities was normal.   Coordination: Rapid alternating movements in the fingers/hands were of normal speed.  The Finger-to-nose maneuver was intact without evidence of ataxia, dysmetria or tremor.   Gait and station: Patient could rise unassisted from a seated position, walked without assistive device.  Stance is of normal width/ base and the patient turned with 3 steps.  Toe and heel walk were deferred.  Deep tendon reflexes: in the  upper and lower extremities are symmetric and intact.  Babinski response was deferred.       After spending a total time of  35 minutes face to face and additional time for physical and neurologic examination,  review of laboratory studies,  personal review of imaging studies, reports and results of other testing and review of referral information / records as far as provided in visit, I have established the following assessments:  1) deja vu and auras interchange for this patient with possible chronic excessive daytime sleepiness.  2) frequent migraimes.  3) OSA was diagnoses in 2011, and her machine is as old, not Designer, industrial/product. No data.    My Plan is to proceed with:  1) repeat sleep study with expanded EEG- repeat with attention to excessive daytime sleepiness, no restorative sleep and untreated OSA.     I would like to thank Dr. Lucia Gaskins and Clent Ridges, MD   Shawnie Dapper, [address] [place],  [place] [address] for allowing me to meet with and to take care of this pleasant patient.   IElectronically signed by: Melvyn Novas, MD 11/30/2018 9:20 AM  [name] Board certified by The [employer] of Sleep Medicine and Diplomate of the  [employer] of [employer] Medicine. Board certified In Neurology through the ABPN, Fellow of the [employer] of Neurology. Medical Director of [facility].

## 2018-11-30 NOTE — Patient Instructions (Signed)

## 2018-12-08 ENCOUNTER — Encounter: Payer: Self-pay | Admitting: Family Medicine

## 2018-12-24 ENCOUNTER — Ambulatory Visit (INDEPENDENT_AMBULATORY_CARE_PROVIDER_SITE_OTHER): Payer: BC Managed Care – PPO | Admitting: Neurology

## 2018-12-24 ENCOUNTER — Other Ambulatory Visit: Payer: Self-pay

## 2018-12-24 DIAGNOSIS — R110 Nausea: Secondary | ICD-10-CM

## 2018-12-24 DIAGNOSIS — G43709 Chronic migraine without aura, not intractable, without status migrainosus: Secondary | ICD-10-CM

## 2018-12-24 DIAGNOSIS — I158 Other secondary hypertension: Secondary | ICD-10-CM

## 2018-12-24 DIAGNOSIS — M2619 Other specified anomalies of jaw-cranial base relationship: Secondary | ICD-10-CM

## 2018-12-24 DIAGNOSIS — G4733 Obstructive sleep apnea (adult) (pediatric): Secondary | ICD-10-CM

## 2018-12-28 DIAGNOSIS — R110 Nausea: Secondary | ICD-10-CM | POA: Insufficient documentation

## 2018-12-28 DIAGNOSIS — I158 Other secondary hypertension: Secondary | ICD-10-CM | POA: Insufficient documentation

## 2018-12-28 DIAGNOSIS — M2619 Other specified anomalies of jaw-cranial base relationship: Secondary | ICD-10-CM | POA: Insufficient documentation

## 2018-12-28 DIAGNOSIS — G4733 Obstructive sleep apnea (adult) (pediatric): Secondary | ICD-10-CM | POA: Insufficient documentation

## 2018-12-28 NOTE — Procedures (Signed)
PATIENT'S NAME:  Morgan [name], Morgan [name] DOB:      11-06-1975      MR#:    [id number]     DATE OF RECORDING: 12/24/2018  MR REFERRING M.D.:  Debbora Presto, NP- Dr. Jaynee Eagles, MD  Study Performed:   Baseline Polysomnogram HISTORY:    I have the pleasure of seeing Morgan [name] today, a left -handed African American female with a possible sleep disorder. She has a medical history of tubal ligation, Allergy, Anxiety, Asthma, Depression, Headache, Kidney stone, vascular headaches/ Migraines, Pregnancy induced malignant hypertension, ADD/ ADHD, and known Sleep apnea.   The patient had the first sleep study in the year 2009 with a result of OSA, started on CPAP- and never had follow up.  Not used since 2015. Her husband has a CPAP, too.  Pt is seen her for in general for migraine management, referred by Jaynee Eagles MD, and Ubaldo Glassing, NP. She reports being rather drowsy than sleepy, sometimes as if in trance. She missed 3 exits of the highway, driving straight- but wasn't sleepy. She endorsed daytime hypersomnia on the Epworth Sleepiness Scale at 16 points.   The patient's weight 199 pounds with a height of 65 (inches), resulting in a BMI of 33.1 kg/m2. The patient's neck circumference measured 14.5 inches.  CURRENT MEDICATIONS: Ernumab-, Premarin, Advil, Cozaar, Zofran, Prometrium, Ubrelvy   PROCEDURE:  This is a multichannel digital polysomnogram utilizing the Somnostar 11.2 system.  Electrodes and sensors were applied and monitored per AASM Specifications.   EEG, EOG, Chin and Limb EMG, were sampled at 200 Hz.  ECG, Snore and Nasal Pressure, Thermal Airflow, Respiratory Effort, CPAP Flow and Pressure, Oximetry was sampled at 50 Hz. Digital video and audio were recorded.      BASELINE STUDY: Lights Out was at 21:59 and Lights On at 04:27.  Total recording time (TRT) was 388.5 minutes, with a total sleep time (TST) of 331 minutes.   The patient's sleep latency was 18 minutes.  REM latency was 95.5 minutes.  The sleep  efficiency was 85.2 %.     SLEEP ARCHITECTURE: WASO (Wake after sleep onset) was 27.5 minutes.  There were 20.5 minutes in Stage N1, 241.5 minutes Stage N2, 43.5 minutes Stage N3 and 25.5 minutes in Stage REM.  The percentage of Stage N1 was 6.2%, Stage N2 was 73.%, Stage N3 was 13.1% and Stage R (REM sleep) was 7.7%.   RESPIRATORY ANALYSIS:  There were a total of 116 respiratory events:  8 obstructive apneas, 0 central apneas and 0 mixed apneas with a total of 8 apneas and an apnea index (AI) of 1.5 /hour. There were 108 hypopneas with a hypopnea index of 19.6 /hour. The patient also had few respiratory event related arousals (RERAs).      The total APNEA/HYPOPNEA INDEX (AHI) was 21.0 /hour.  33 events occurred in REM sleep and 164 events in NREM. The REM AHI was 77.6 /hour, versus a non-REM AHI of 16.3. The patient spent 186.5 minutes of total sleep time in the supine position and 145 minutes in non-supine. The supine AHI was 23.1/h versus a non-supine AHI of 18.3/h.  OXYGEN SATURATION & C02:  The Wake baseline 02 saturation was 95%, with the lowest being 83%. Time spent below 89% saturation equaled 8 minutes.  The arousals were noted as: 46 were spontaneous, 0 were associated with PLMs, and 64 were associated with respiratory events. The patient had a total of 0 Periodic Limb Movements.   Audio and video analysis did not show  any abnormal or unusual movements, behaviors, phonations or vocalizations.   Snoring was noted. EKG was in keeping with normal sinus rhythm (NSR).  IMPRESSION:  1. Obstructive Sleep Apnea (OSA), mild - moderate at AHI at 21/h with REM accentuation to AHI 76/h.   2. Primary Snoring  RECOMMENDATIONS:  1. Advise full-night, attended, CPAP titration study to optimize therapy.  2. Plan B:  If this is not possible, will order autotitration device with a setting for CPAP pressure of 6-16 cm water, 2 cm EPR and heated humidity, with interface of patient's choice.     I  certify that I have reviewed the entire raw data recording prior to the issuance of this report in accordance with the Standards of Accreditation of the American Academy of Sleep Medicine (AASM)   Melvyn Novas, MD   12-28-2018 Diplomat, American Board of Psychiatry and Neurology  Diplomat, American Board of Sleep Medicine Medical Director, [facility]

## 2018-12-29 ENCOUNTER — Encounter: Payer: Self-pay | Admitting: Neurology

## 2018-12-29 ENCOUNTER — Telehealth: Payer: Self-pay | Admitting: Neurology

## 2018-12-29 NOTE — Telephone Encounter (Signed)
-----   Message from Larey Seat, MD sent at 12/28/2018 12:08 PM EDT ----- IMPRESSION:   1. Obstructive Sleep Apnea (OSA), mild - moderate at AHI at 21/h  with REM accentuation to AHI 76/h.   2. Primary Snoring   RECOMMENDATIONS:   1. Advise full-night, attended, CPAP titration study to optimize  therapy.   2. Plan B: If this is not possible, will order autotitration  device with a setting for CPAP pressure of 6-16 cm water, 2 cm  EPR and heated humidity, with interface of patient's choice.

## 2018-12-29 NOTE — Telephone Encounter (Signed)
Called patient to discuss sleep study results. No answer at this time. LVM for the patient to call back.  I will also send a mychart message as well

## 2019-01-07 NOTE — Telephone Encounter (Signed)
I called pt. I advised pt that Dr. Brett Fairy reviewed their sleep study results and found that pt moderate . Dr. Brett Fairy recommends that pt starts auto CPAP. I reviewed PAP compliance expectations with the pt. Pt is agreeable to starting a CPAP. I advised pt that an order will be sent to a DME, [facility], and [facility] will call the pt within about one week after they file with the pt's insurance. [facility] will show the pt how to use the machine, fit for masks, and troubleshoot the CPAP if needed. A follow up appt was made for insurance purposes with Debbora Presto, NP on Jan 5,2021 at 3 pm. Pt verbalized understanding to arrive 15 minutes early and bring their CPAP. A letter with all of this information in it will be mailed to the pt as a reminder. I verified with the pt that the address we have on file is correct. Pt verbalized understanding of results. Pt had no questions at this time but was encouraged to call back if questions arise. I have sent the order to [facility] and have received confirmation that they have received the order.

## 2019-01-11 ENCOUNTER — Other Ambulatory Visit: Payer: Self-pay | Admitting: Neurology

## 2019-01-11 DIAGNOSIS — G4733 Obstructive sleep apnea (adult) (pediatric): Secondary | ICD-10-CM

## 2019-02-24 ENCOUNTER — Emergency Department (HOSPITAL_COMMUNITY): Payer: BC Managed Care – PPO

## 2019-02-24 ENCOUNTER — Other Ambulatory Visit: Payer: Self-pay

## 2019-02-24 ENCOUNTER — Emergency Department (HOSPITAL_COMMUNITY)
Admission: EM | Admit: 2019-02-24 | Discharge: 2019-02-25 | Disposition: A | Discharge status: Home / Self Care | Payer: BC Managed Care – PPO | Attending: Emergency Medicine | Admitting: Emergency Medicine

## 2019-02-24 ENCOUNTER — Encounter (HOSPITAL_COMMUNITY): Payer: Self-pay

## 2019-02-24 DIAGNOSIS — R2242 Localized swelling, mass and lump, left lower limb: Secondary | ICD-10-CM | POA: Diagnosis not present

## 2019-02-24 DIAGNOSIS — F1210 Cannabis abuse, uncomplicated: Secondary | ICD-10-CM | POA: Insufficient documentation

## 2019-02-24 DIAGNOSIS — M25572 Pain in left ankle and joints of left foot: Secondary | ICD-10-CM | POA: Insufficient documentation

## 2019-02-24 DIAGNOSIS — J45909 Unspecified asthma, uncomplicated: Secondary | ICD-10-CM | POA: Diagnosis not present

## 2019-02-24 DIAGNOSIS — Z79899 Other long term (current) drug therapy: Secondary | ICD-10-CM | POA: Insufficient documentation

## 2019-02-24 NOTE — ED Triage Notes (Signed)
Pt states that last week her L ankle became painful and is now getting worse, now beginning to bruise and swell, denies injury

## 2019-02-25 MED ORDER — MELOXICAM 15 MG PO TABS: 15.0000 mg | ORAL_TABLET | Freq: Every day | ORAL | 0 refills | Status: DC

## 2019-02-25 MED ORDER — HYDROCODONE-ACETAMINOPHEN 5-325 MG PO TABS
1.0000 | ORAL_TABLET | Freq: Once | ORAL | Status: AC
  Administered 2019-02-25: 03:00:00 1 via ORAL
  Filled 2019-02-25: qty 1

## 2019-02-25 NOTE — ED Notes (Signed)
Patient verbalizes understanding of discharge instructions. Opportunity for questioning and answers were provided. Armband removed by staff, pt discharged from ED. Pt. ambulatory and discharged home.  

## 2019-02-25 NOTE — ED Provider Notes (Signed)
[facility]   [name] Arrival date & time: 02/24/19  2312     History Chief Complaint  Patient presents with  . Ankle Pain    Morgan [name] is a 43 y.o. female with presents to the Emergency Department complaining of gradual, persistent, progressively worsening left ankle pain.  Patient reports it has been ongoing for couple of weeks but worsened over the last 2 days with associated swelling and ecchymosis to the lateral side of the left ankle.  She denies known injury or trauma.  She denies fevers or chills, nausea or vomiting.  She has no history of gout or IV drug use.  She reports she has good range of motion but dorsiflexion creates pain.  She has taken ibuprofen with minimal relief.  Patient reports the pain is making it harder to walk.  No surgery to this foot or ankle.  No hip or knee pain.  The history is provided by the patient and medical records. No language interpreter was used.       Past Medical History:  Diagnosis Date  . Abortion 03/09/2013  . Allergy   . Anxiety   . Asthma   . Depression   . Headache   . Kidney stone   . Migraines   . Pregnancy induced hypertension   . Sleep apnea     Patient Active Problem List   Diagnosis Date Noted  . 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 Other secondary hypertension 12/28/2018  . Nausea 12/28/2018  . Moderate obstructive sleep apnea-hypopnea syndrome 12/28/2018  . Chronic migraine w/o aura w/o status migrainosus, not intractable 05/31/2015  . Major depressive disorder, recurrent, severe without psychotic features (HCC)   . MDD (major depressive disorder), recurrent episode, severe (HCC) 11/30/2014  . Ureterolithiasis 04/19/2014  . Migraine 03/18/2013  . OSA on CPAP 03/18/2013    Past Surgical History:  Procedure Laterality Date  . CERVICAL CERCLAGE     all 3 pregnancies  . DILATION AND CURETTAGE OF UTERUS  2001   retained placenta  . DILATION AND  CURETTAGE OF UTERUS  2006   SAB  . MOUTH SURGERY  1999  . TUBAL LIGATION       OB History    Gravida  7   Para  3   Term  1   Preterm  2   AB  4   Living  1     SAB  2   TAB  2   Ectopic  0   Multiple  0   Live Births  3           Family History  Problem Relation Age of Onset  . HIV Mother   . Seizures Mother        secondary to drug abuse  . Drug abuse Mother   . Heart disease Maternal Grandfather   . Diabetes Maternal Grandfather   . Cancer Maternal Grandfather   . Diabetes Paternal Grandmother   . Cancer Paternal Grandfather   . Heart disease Maternal Grandmother   . Hypertension Maternal Grandmother   . Migraines Neg Hx     Social History   Tobacco Use  . Smoking status: Never Smoker  . Smokeless tobacco: Never Used  Substance Use Topics  . Alcohol use: Yes    Alcohol/week: 0.0 standard drinks    Comment: every once in awhile  . Drug use: Yes    Frequency: 7.0 times per week    Types: Marijuana  Comment: last use 09/13/15; as of 08/07/17 still smokes daily    Home Medications Prior to Admission medications   Medication Sig Start Date End Date Taking? Authorizing Provider  Erenumab-aooe 70 MG/ML SOAJ Inject 70 mg into the skin every 30 (thirty) days. 05/04/18   Lomax, Amy, NP  estrogens, conjugated, (PREMARIN) 0.625 MG tablet Take 0.625 mg by mouth daily. Take daily for 21 days then do not take for 7 days.    [provider]  ibuprofen (ADVIL,MOTRIN) 200 MG tablet Take 400-800 mg by mouth every 6 (six) hours as needed for headache.    [provider]  losartan (COZAAR) 25 MG tablet Take 25 mg by mouth daily.    [provider]  meloxicam (MOBIC) 15 MG tablet Take 1 tablet (15 mg total) by mouth daily. 02/25/19   Akiya Morr, Dahlia Client, PA-C  ondansetron (ZOFRAN-ODT) 4 MG disintegrating tablet Take 1 tablet (4 mg total) by mouth every 8 (eight) hours as needed for nausea. 08/10/17   Anson Fret, MD  progesterone  (PROMETRIUM) 100 MG capsule Take 100 mg by mouth daily.    [provider]  Ubrogepant (UBRELVY) 50 MG TABS Take 50 mg by mouth daily as needed. Take one tablet at onset of headache, may repeat 1 tablet in 2 hours, no more than 2 tablets in 24 hours 05/04/18   Lomax, Amy, NP    Allergies    Stadol [butorphanol tartrate] and Kale  Review of Systems   Review of Systems  Constitutional: Negative for chills and fever.  Gastrointestinal: Negative for nausea and vomiting.  Musculoskeletal: Positive for arthralgias and joint swelling. Negative for back pain.  Skin: Negative for wound.  Neurological: Negative for numbness.  Hematological: Does not bruise/bleed easily.  All other systems reviewed and are negative.   Physical Exam Updated Vital Signs BP (!) 126/96   Pulse 65   Temp 98.9 F (37.2 C) (Oral)   Resp 16   LMP 11/12/2018 (Approximate) Comment: Pt states tube ablation as well  SpO2 99%   Physical Exam Vitals and nursing note reviewed.  Constitutional:      General: She is not in acute distress.    Appearance: She is well-developed.  HENT:     Head: Normocephalic.  Eyes:     General: No scleral icterus.    Conjunctiva/sclera: Conjunctivae normal.  Cardiovascular:     Rate and Rhythm: Normal rate.  Pulmonary:     Effort: Pulmonary effort is normal.  Musculoskeletal:     Cervical back: Normal range of motion.     Right knee: Normal.     Left knee: Normal.     Right ankle: Normal. No swelling, deformity, ecchymosis or lacerations. No tenderness. Normal range of motion. Normal pulse.     Right Achilles Tendon: Normal.     Left ankle: Swelling and ecchymosis present. No deformity or lacerations. Tenderness present over the lateral malleolus. Decreased range of motion (slightly). Normal pulse.     Left Achilles Tendon: Normal.     Comments: Mild ecchymosis and swelling to the lateral portion of the left ankle.  Almost full range of motion of the left ankle.  Skin:     General: Skin is warm and dry.  Neurological:     Mental Status: She is alert.     Comments: Sensation grossly intact to the bilateral lower extremities including bilateral feet.  Strength 5/5 with dorsiflexion and plantarflexion bilaterally at the ankles and great toe.     ED  Results / Procedures / Treatments    Radiology DG Ankle Complete Left  Result Date: 02/25/2019 CLINICAL DATA:  43 year old female with left ankle pain. No known injury. EXAM: LEFT ANKLE COMPLETE - 3+ VIEW COMPARISON:  Left foot radiograph dated 06/16/2014. FINDINGS: There is no acute fracture or dislocation. The bones are well mineralized. No arthritic changes. The ankle mortise is intact. The soft tissues are unremarkable. IMPRESSION: Negative. Electronically Signed   By: Anner Crete M.D.   On: 02/25/2019 00:01    Procedures Procedures (including critical care time)  Medications Ordered in ED Medications  HYDROcodone-acetaminophen (NORCO/VICODIN) 5-325 MG per tablet 1 tablet (has no administration in time range)    ED Course  I have reviewed the triage vital signs and the nursing notes.  Pertinent labs & imaging results that were available during my care of the patient were reviewed by me and considered in my medical decision making (see chart for details).    MDM Rules/Calculators/A&P                      Patient X-Ray negative for obvious fracture or dislocation.  Personally evaluated these images.  Pain managed in ED. no evidence of septic joint.  No overlying erythema, warmth or induration to suggest cellulitis/abscess.  Pt advised to follow up with orthopedics if symptoms persist for possibility of missed fracture diagnosis. Patient given brace while in ED, conservative therapy recommended and discussed. Patient will be dc home & is agreeable with above plan.  Final Clinical Impression(s) / ED Diagnoses Final diagnoses:  Acute left ankle pain    Rx / DC Orders ED Discharge Orders          Ordered    meloxicam (MOBIC) 15 MG tablet  Daily     02/25/19 0310           Symir Mah, Gwenlyn Perking 02/25/19 2426    Fatima Blank, MD 02/25/19 [phone]

## 2019-02-25 NOTE — Discharge Instructions (Addendum)
1. Medications: Mobic, usual home medications 2. Treatment: rest, ice, elevate and use brace, drink plenty of fluids, gentle stretching 3. Follow Up: Please followup with orthopedics as directed or your PCP in 1 week if no improvement for discussion of your diagnoses and further evaluation after today's visit; if you do not have a primary care doctor use the resource guide provided to find one; Please return to the ER for worsening symptoms or other concerns

## 2019-03-08 ENCOUNTER — Telehealth: Payer: Self-pay

## 2019-03-08 NOTE — Telephone Encounter (Signed)
Pt states that she has not really been using her CPAP and would like a further out appt. R/S waitlisted the pt.   Originally I called to... Called pt regarding their appt w/ NP. All of the Nps schedules are being switched to MyChart visits due to short staffing and Covid.  Per supervisor NP Kerman Passey requests.   If pt has difficulty w/ mychart please have them contact [phone]. If pt wants to r/s their appt please r/s pts appt.

## 2019-03-09 ENCOUNTER — Ambulatory Visit: Payer: Self-pay | Admitting: Family Medicine

## 2019-03-10 ENCOUNTER — Ambulatory Visit: Payer: BC Managed Care – PPO | Admitting: Family Medicine

## 2019-03-24 ENCOUNTER — Ambulatory Visit (INDEPENDENT_AMBULATORY_CARE_PROVIDER_SITE_OTHER): Payer: BC Managed Care – PPO | Admitting: Family Medicine

## 2019-04-07 ENCOUNTER — Ambulatory Visit (INDEPENDENT_AMBULATORY_CARE_PROVIDER_SITE_OTHER): Payer: BC Managed Care – PPO | Admitting: Family Medicine

## 2019-05-13 ENCOUNTER — Other Ambulatory Visit: Payer: Self-pay

## 2019-05-13 ENCOUNTER — Ambulatory Visit: Payer: BC Managed Care – PPO | Admitting: Family Medicine

## 2019-05-13 ENCOUNTER — Encounter: Payer: Self-pay | Admitting: Family Medicine

## 2019-05-13 VITALS — BP 125/85 | HR 58 | Temp 97.7°F | Ht 65.0 in | Wt 202.8 lb

## 2019-05-13 DIAGNOSIS — G4733 Obstructive sleep apnea (adult) (pediatric): Secondary | ICD-10-CM | POA: Diagnosis not present

## 2019-05-13 DIAGNOSIS — Z9989 Dependence on other enabling machines and devices: Secondary | ICD-10-CM | POA: Diagnosis not present

## 2019-05-13 DIAGNOSIS — G43709 Chronic migraine without aura, not intractable, without status migrainosus: Secondary | ICD-10-CM | POA: Diagnosis not present

## 2019-05-13 MED ORDER — UBRELVY 50 MG PO TABS: 50.0000 mg | ORAL_TABLET | Freq: Every day | ORAL | 11 refills | Status: DC | PRN

## 2019-05-13 MED ORDER — ERENUMAB-AOOE 70 MG/ML ~~LOC~~ SOAJ: 70.0000 mg | SUBCUTANEOUS | 4 refills | Status: DC
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
## 2019-05-13 NOTE — Patient Instructions (Signed)
Please continue CPAP therapy nightly and greater than 4 hours each night   Continue Amovig and Ubrelvy as prescribed.   Follow up in 1 year   Sleep Apnea Sleep apnea affects breathing during sleep. It causes breathing to stop for a short time or to become shallow. It can also increase the risk of:  Heart attack.  Stroke.  Being very overweight (obese).  Diabetes.  Heart failure.  Irregular heartbeat. The goal of treatment is to help you breathe normally again. What are the causes? There are three kinds of sleep apnea:  Obstructive sleep apnea. This is caused by a blocked or collapsed airway.  Central sleep apnea. This happens when the brain does not send the right signals to the muscles that control breathing.  Mixed sleep apnea. This is a combination of obstructive and central sleep apnea. The most common cause of this condition is a collapsed or blocked airway. This can happen if:  Your throat muscles are too relaxed.  Your tongue and tonsils are too large.  You are overweight.  Your airway is too small. What increases the risk?  Being overweight.  Smoking.  Having a small airway.  Being older.  Being female.  Drinking alcohol.  Taking medicines to calm yourself (sedatives or tranquilizers).  Having family members with the condition. What are the signs or symptoms?  Trouble staying asleep.  Being sleepy or tired during the day.  Getting angry a lot.  Loud snoring.  Headaches in the morning.  Not being able to focus your mind (concentrate).  Forgetting things.  Less interest in sex.  Mood swings.  Personality changes.  Feelings of sadness (depression).  Waking up a lot during the night to pee (urinate).  Dry mouth.  Sore throat. How is this diagnosed?  Your medical history.  A physical exam.  A test that is done when you are sleeping (sleep study). The test is most often done in a sleep lab but may also be done at home. How is  this treated?   Sleeping on your side.  Using a medicine to get rid of mucus in your nose (decongestant).  Avoiding the use of alcohol, medicines to help you relax, or certain pain medicines (narcotics).  Losing weight, if needed.  Changing your diet.  Not smoking.  Using a machine to open your airway while you sleep, such as: ? An oral appliance. This is a mouthpiece that shifts your lower jaw forward. ? A CPAP device. This device blows air through a mask when you breathe out (exhale). ? An EPAP device. This has valves that you put in each nostril. ? A BPAP device. This device blows air through a mask when you breathe in (inhale) and breathe out.  Having surgery if other treatments do not work. It is important to get treatment for sleep apnea. Without treatment, it can lead to:  High blood pressure.  Coronary artery disease.  In men, not being able to have an erection (impotence).  Reduced thinking ability. Follow these instructions at home: Lifestyle  Make changes that your doctor recommends.  Eat a healthy diet.  Lose weight if needed.  Avoid alcohol, medicines to help you relax, and some pain medicines.  Do not use any products that contain nicotine or tobacco, such as cigarettes, e-cigarettes, and chewing tobacco. If you need help quitting, ask your doctor. General instructions  Take over-the-counter and prescription medicines only as told by your doctor.  If you were given a machine to  use while you sleep, use it only as told by your doctor.  If you are having surgery, make sure to tell your doctor you have sleep apnea. You may need to bring your device with you.  Keep all follow-up visits as told by your doctor. This is important. Contact a doctor if:  The machine that you were given to use during sleep bothers you or does not seem to be working.  You do not get better.  You get worse. Get help right away if:  Your chest hurts.  You have trouble  breathing in enough air.  You have an uncomfortable feeling in your back, arms, or stomach.  You have trouble talking.  One side of your body feels weak.  A part of your face is hanging down. These symptoms may be an emergency. Do not wait to see if the symptoms will go away. Get medical help right away. Call your local emergency services (911 in the [place] Do not drive yourself to the hospital. Summary  This condition affects breathing during sleep.  The most common cause is a collapsed or blocked airway.  The goal of treatment is to help you breathe normally while you sleep. This information is not intended to replace advice given to you by your health care provider. Make sure you discuss any questions you have with your health care provider. Document Revised: 12/05/2017 Document Reviewed: 10/14/2017 Elsevier Patient Education  [place].

## 2019-07-28 ENCOUNTER — Telehealth: Payer: Self-pay | Admitting: *Deleted

## 2019-07-28 NOTE — Telephone Encounter (Signed)
Received med questionaire from [facility] relating to pt (when she went to give herself an injection she was not able to inject then when she ewas speaking with [facility]  And looking for the lot #, the injectible went off in to her finger.  She did receive an another injectible in a week.  She also mentioned that the expiration date was 05-2019, when she was giving the injection 07-02-2019.  She received 90 days supply.  Faxed with fax confirmation received. To [facility] [phone]

## 2019-11-11 ENCOUNTER — Encounter: Payer: Self-pay | Admitting: Family Medicine

## 2019-11-11 ENCOUNTER — Ambulatory Visit: Payer: BC Managed Care – PPO | Admitting: Family Medicine
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
## 2019-12-08 ENCOUNTER — Telehealth: Payer: Self-pay

## 2019-12-08 NOTE — Telephone Encounter (Signed)
A pa has been sent via CMM for Aimovig KEY [id number] Status pending

## 2019-12-13 NOTE — Telephone Encounter (Signed)
A pa for Aimovig has been approved  this request is approved for the following time period: 12/09/2019 - 12/08/2020 LVM to inform pt Notice faxed to pharmacy

## 2020-03-30 ENCOUNTER — Encounter: Payer: Self-pay | Admitting: Family Medicine

## 2020-03-30 ENCOUNTER — Other Ambulatory Visit: Payer: Self-pay | Admitting: Family Medicine

## 2020-03-30 ENCOUNTER — Telehealth: Payer: Self-pay | Admitting: Family Medicine

## 2020-03-30 ENCOUNTER — Ambulatory Visit (INDEPENDENT_AMBULATORY_CARE_PROVIDER_SITE_OTHER): Payer: BC Managed Care – PPO | Admitting: Family Medicine

## 2020-03-30 VITALS — BP 137/89 | HR 69 | Ht 65.0 in | Wt 203.0 lb

## 2020-03-30 DIAGNOSIS — G4733 Obstructive sleep apnea (adult) (pediatric): Secondary | ICD-10-CM | POA: Diagnosis not present

## 2020-03-30 DIAGNOSIS — Z9989 Dependence on other enabling machines and devices: Secondary | ICD-10-CM

## 2020-03-30 DIAGNOSIS — G43709 Chronic migraine without aura, not intractable, without status migrainosus: Secondary | ICD-10-CM | POA: Diagnosis not present

## 2020-03-30 MED ORDER — UBRELVY 50 MG PO TABS: 50.0000 mg | ORAL_TABLET | Freq: Every day | ORAL | 11 refills | Status: AC | PRN

## 2020-03-30 MED ORDER — ERENUMAB-AOOE 70 MG/ML ~~LOC~~ SOAJ: 70.0000 mg | SUBCUTANEOUS | 0 refills | Status: DC

## 2020-03-30 MED ORDER — ERENUMAB-AOOE 140 MG/ML ~~LOC~~ SOAJ: 140.0000 mg | SUBCUTANEOUS | 0 refills | Status: DC

## 2020-03-30 MED ORDER — ERENUMAB-AOOE 70 MG/ML ~~LOC~~ SOAJ: 70.0000 mg | SUBCUTANEOUS | 3 refills | Status: DC

## 2020-03-30 NOTE — Telephone Encounter (Signed)
aimovig not covered.  Ok for [employer] ?

## 2020-03-30 NOTE — Telephone Encounter (Signed)
I called pt and she has had 3 day migraine but also her aimovig 70mg  /ml not overed by insurance as well as the .  I offered her sample, but then noted that last seen 05/2019.  Moved hre appt to today.

## 2020-03-30 NOTE — Patient Instructions (Signed)
Below is our plan:  We will restart Amovig. Please resume use of CPAP. I will call in Ubrelvy for abortive therapy.   Please make sure you are staying well hydrated. I recommend 50-60 ounces daily. Well balanced diet and regular exercise encouraged.   Please continue follow up with care team as directed.   Follow up with me in 3 months.   You may receive a survey regarding today's visit. I encourage you to leave honest feed back as I do use this information to improve patient care. Thank you for seeing me today!       Please start using your CPAP regularly. While your insurance requires that you use CPAP at least 4 hours each night on 70% of the nights, I recommend, that you not skip any nights and use it throughout the night if you can. Getting used to CPAP and staying with the treatment long term does take time and patience and discipline. Untreated obstructive sleep apnea when it is moderate to severe can have an adverse impact on cardiovascular health and raise her risk for heart disease, arrhythmias, hypertension, congestive heart failure, stroke and diabetes. Untreated obstructive sleep apnea causes sleep disruption, nonrestorative sleep, and sleep deprivation. This can have an impact on your day to day functioning and cause daytime sleepiness and impairment of cognitive function, memory loss, mood disturbance, and problems focussing. Using CPAP regularly can improve these symptoms.   Sleep Apnea Sleep apnea affects breathing during sleep. It causes breathing to stop for a short time or to become shallow. It can also increase the risk of:  Heart attack.  Stroke.  Being very overweight (obese).  Diabetes.  Heart failure.  Irregular heartbeat. The goal of treatment is to help you breathe normally again. What are the causes? There are three kinds of sleep apnea:  Obstructive sleep apnea. This is caused by a blocked or collapsed airway.  Central sleep apnea. This happens when  the brain does not send the right signals to the muscles that control breathing.  Mixed sleep apnea. This is a combination of obstructive and central sleep apnea. The most common cause of this condition is a collapsed or blocked airway. This can happen if:  Your throat muscles are too relaxed.  Your tongue and tonsils are too large.  You are overweight.  Your airway is too small.   What increases the risk?  Being overweight.  Smoking.  Having a small airway.  Being older.  Being female.  Drinking alcohol.  Taking medicines to calm yourself (sedatives or tranquilizers).  Having family members with the condition. What are the signs or symptoms?  Trouble staying asleep.  Being sleepy or tired during the day.  Getting angry a lot.  Loud snoring.  Headaches in the morning.  Not being able to focus your mind (concentrate).  Forgetting things.  Less interest in sex.  Mood swings.  Personality changes.  Feelings of sadness (depression).  Waking up a lot during the night to pee (urinate).  Dry mouth.  Sore throat. How is this diagnosed?  Your medical history.  A physical exam.  A test that is done when you are sleeping (sleep study). The test is most often done in a sleep lab but may also be done at home. How is this treated?  Sleeping on your side.  Using a medicine to get rid of mucus in your nose (decongestant).  Avoiding the use of alcohol, medicines to help you relax, or certain pain medicines (narcotics).  Losing weight, if needed.  Changing your diet.  Not smoking.  Using a machine to open your airway while you sleep, such as: ? An oral appliance. This is a mouthpiece that shifts your lower jaw forward. ? A CPAP device. This device blows air through a mask when you breathe out (exhale). ? An EPAP device. This has valves that you put in each nostril. ? A BPAP device. This device blows air through a mask when you breathe in (inhale) and  breathe out.  Having surgery if other treatments do not work. It is important to get treatment for sleep apnea. Without treatment, it can lead to:  High blood pressure.  Coronary artery disease.  In men, not being able to have an erection (impotence).  Reduced thinking ability.   Follow these instructions at home: Lifestyle  Make changes that your doctor recommends.  Eat a healthy diet.  Lose weight if needed.  Avoid alcohol, medicines to help you relax, and some pain medicines.  Do not use any products that contain nicotine or tobacco, such as cigarettes, e-cigarettes, and chewing tobacco. If you need help quitting, ask your doctor. General instructions  Take over-the-counter and prescription medicines only as told by your doctor.  If you were given a machine to use while you sleep, use it only as told by your doctor.  If you are having surgery, make sure to tell your doctor you have sleep apnea. You may need to bring your device with you.  Keep all follow-up visits as told by your doctor. This is important. Contact a doctor if:  The machine that you were given to use during sleep bothers you or does not seem to be working.  You do not get better.  You get worse. Get help right away if:  Your chest hurts.  You have trouble breathing in enough air.  You have an uncomfortable feeling in your back, arms, or stomach.  You have trouble talking.  One side of your body feels weak.  A part of your face is hanging down. These symptoms may be an emergency. Do not wait to see if the symptoms will go away. Get medical help right away. Call your local emergency services (911 in the [place] Do not drive yourself to the hospital. Summary  This condition affects breathing during sleep.  The most common cause is a collapsed or blocked airway.  The goal of treatment is to help you breathe normally while you sleep. This information is not intended to replace advice given to  you by your health care provider. Make sure you discuss any questions you have with your health care provider. Document Revised: 12/05/2017 Document Reviewed: 10/14/2017 Elsevier Patient Education  2021 [employer]   Migraine Headache A migraine headache is a very strong throbbing pain on one side or both sides of your head. This type of headache can also cause other symptoms. It can last from 4 hours to 3 days. Talk with your doctor about what things may bring on (trigger) this condition. What are the causes? The exact cause of this condition is not known. This condition may be triggered or caused by:  Drinking alcohol.  Smoking.  Taking medicines, such as: ? Medicine used to treat chest pain (nitroglycerin). ? Birth control pills. ? Estrogen. ? Some blood pressure medicines.  Eating or drinking certain products.  Doing physical activity. Other things that may trigger a migraine headache include:  Having a menstrual period.  Pregnancy.  Hunger.  Stress.  Not getting enough sleep or getting too much sleep.  Weather changes.  Tiredness (fatigue). What increases the risk?  Being 9-65 years old.  Being female.  Having a family history of migraine headaches.  Being Caucasian.  Having depression or anxiety.  Being very overweight. What are the signs or symptoms?  A throbbing pain. This pain may: ? Happen in any area of the head, such as on one side or both sides. ? Make it hard to do daily activities. ? Get worse with physical activity. ? Get worse around bright lights or loud noises.  Other symptoms may include: ? Feeling sick to your stomach (nauseous). ? Vomiting. ? Dizziness. ? Being sensitive to bright lights, loud noises, or smells.  Before you get a migraine headache, you may get warning signs (an aura). An aura may include: ? Seeing flashing lights or having blind spots. ? Seeing bright spots, halos, or zigzag lines. ? Having tunnel vision or  blurred vision. ? Having numbness or a tingling feeling. ? Having trouble talking. ? Having weak muscles.  Some people have symptoms after a migraine headache (postdromal phase), such as: ? Tiredness. ? Trouble thinking (concentrating). How is this treated?  Taking medicines that: ? Relieve pain. ? Relieve the feeling of being sick to your stomach. ? Prevent migraine headaches.  Treatment may also include: ? Having acupuncture. ? Avoiding foods that bring on migraine headaches. ? Learning ways to control your body functions (biofeedback). ? Therapy to help you know and deal with negative thoughts (cognitive behavioral therapy). Follow these instructions at home: Medicines  Take over-the-counter and prescription medicines only as told by your doctor.  Ask your doctor if the medicine prescribed to you: ? Requires you to avoid driving or using heavy machinery. ? Can cause trouble pooping (constipation). You may need to take these steps to prevent or treat trouble pooping:  Drink enough fluid to keep your pee (urine) pale yellow.  Take over-the-counter or prescription medicines.  Eat foods that are high in fiber. These include beans, whole grains, and fresh fruits and vegetables.  Limit foods that are high in fat and sugar. These include fried or sweet foods. Lifestyle  Do not drink alcohol.  Do not use any products that contain nicotine or tobacco, such as cigarettes, e-cigarettes, and chewing tobacco. If you need help quitting, ask your doctor.  Get at least 8 hours of sleep every night.  Limit and deal with stress. General instructions  Keep a journal to find out what may bring on your migraine headaches. For example, write down: ? What you eat and drink. ? How much sleep you get. ? Any change in what you eat or drink. ? Any change in your medicines.  If you have a migraine headache: ? Avoid things that make your symptoms worse, such as bright lights. ? It may  help to lie down in a dark, quiet room. ? Do not drive or use heavy machinery. ? Ask your doctor what activities are safe for you.  Keep all follow-up visits as told by your doctor. This is important.      Contact a doctor if:  You get a migraine headache that is different or worse than others you have had.  You have more than 15 headache days in one month. Get help right away if:  Your migraine headache gets very bad.  Your migraine headache lasts longer than 72 hours.  You have a fever.  You have a stiff  neck.  You have trouble seeing.  Your muscles feel weak or like you cannot control them.  You start to lose your balance a lot.  You start to have trouble walking.  You pass out (faint).  You have a seizure. Summary  A migraine headache is a very strong throbbing pain on one side or both sides of your head. These headaches can also cause other symptoms.  This condition may be treated with medicines and changes to your lifestyle.  Keep a journal to find out what may bring on your migraine headaches.  Contact a doctor if you get a migraine headache that is different or worse than others you have had.  Contact your doctor if you have more than 15 headache days in a month. This information is not intended to replace advice given to you by your health care provider. Make sure you discuss any questions you have with your health care provider. Document Revised: 06/12/2018 Document Reviewed: 04/02/2018 Elsevier Patient Education  2021 [employer].
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
## 2020-03-30 NOTE — Telephone Encounter (Signed)
Pt called, pharmacy emailed, insurance will not cover Erenumab-aooe 70 MG/ML SOAJ. Would like a call from the nurse to discuss a medication insurance will cover.

## 2020-05-15 ENCOUNTER — Ambulatory Visit: Payer: BC Managed Care – PPO | Admitting: Family Medicine

## 2020-05-15 ENCOUNTER — Encounter: Payer: Self-pay | Admitting: Family Medicine

## 2020-05-15 NOTE — Patient Instructions (Incomplete)
Please continue using your CPAP regularly. While your insurance requires that you use CPAP at least 4 hours each night on 70% of the nights, I recommend, that you not skip any nights and use it throughout the night if you can. Getting used to CPAP and staying with the treatment long term does take time and patience and discipline. Untreated obstructive sleep apnea when it is moderate to severe can have an adverse impact on cardiovascular health and raise her risk for heart disease, arrhythmias, hypertension, congestive heart failure, stroke and diabetes. Untreated obstructive sleep apnea causes sleep disruption, nonrestorative sleep, and sleep deprivation. This can have an impact on your day to day functioning and cause daytime sleepiness and impairment of cognitive function, memory loss, mood disturbance, and problems focussing. Using CPAP regularly can improve these symptoms.      CPAP and BPAP Information CPAP and BPAP are methods that use air pressure to keep your airways open and to help you breathe well. CPAP and BPAP use different amounts of pressure. Your health care provider will tell you whether CPAP or BPAP would be more helpful for you.  CPAP stands for "continuous positive airway pressure." With CPAP, the amount of pressure stays the same while you breathe in and out.  BPAP stands for "bi-level positive airway pressure." With BPAP, the amount of pressure will be higher when you breathe in (inhale) and lower when you breathe out(exhale). This allows you to take larger breaths. CPAP or BPAP may be used in the hospital, or your health care provider may want you to use it at home. You may need to have a sleep study before your health care provider can order a machine for you to use at home. Why are CPAP and BPAP treatments used? CPAP or BPAP can be helpful if you have:  Sleep apnea.  Chronic obstructive pulmonary disease (COPD).  Heart failure.  Medical conditions that cause muscle  weakness, including muscular dystrophy or amyotrophic lateral sclerosis (ALS).  Other problems that cause breathing to be shallow, weak, abnormal, or difficult. CPAP and BPAP are most commonly used for obstructive sleep apnea (OSA) to keep the airways from collapsing when the muscles relax during sleep. How is CPAP or BPAP administered? Both CPAP and BPAP are provided by a small machine with a flexible plastic tube that attaches to a plastic mask that you wear. Air is blown through the mask into your nose or mouth. The amount of pressure that is used to blow the air can be adjusted on the machine. Your health care provider will set the pressure setting and help you find the best mask for you. When should CPAP or BPAP be used? In most cases, the mask only needs to be worn during sleep. Generally, the mask needs to be worn throughout the night and during any daytime naps. People with certain medical conditions may also need to wear the mask at other times when they are awake. Follow instructions from your health care provider about when to use the machine. What are some tips for using the mask?  Because the mask needs to be snug, some people feel trapped or closed-in (claustrophobic) when first using the mask. If you feel this way, you may need to get used to the mask. One way to do this is to hold the mask loosely over your nose or mouth and then gradually apply the mask more snugly. You can also gradually increase the amount of time that you use the mask.  Masks are available in various types and sizes. If your mask does not fit well, talk with your health care provider about getting a different one. Some common types of masks include: ? Full face masks, which fit over the mouth and nose. ? Nasal masks, which fit over the nose. ? Nasal pillow or prong masks, which fit into the nostrils.  If you are using a mask that fits over your nose and you tend to breathe through your mouth, a chin strap may be  applied to help keep your mouth closed.  Some CPAP and BPAP machines have alarms that may sound if the mask comes off or develops a leak.  If you have trouble with the mask, it is very important that you talk with your health care provider about finding a way to make the mask easier to tolerate. Do not stop using the mask. There could be a negative impact to your health if you stop using the mask.   What are some tips for using the machine?  Place your CPAP or BPAP machine on a secure table or stand near an electrical outlet.  Know where the on/off switch is on the machine.  Follow instructions from your health care provider about how to set the pressure on your machine and when you should use it.  Do not eat or drink while the CPAP or BPAP machine is on. Food or fluids could get pushed into your lungs by the pressure of the CPAP or BPAP.  For home use, CPAP and BPAP machines can be rented or purchased through home health care companies. Many different brands of machines are available. Renting a machine before purchasing may help you find out which particular machine works well for you. Your insurance may also decide which machine you may get.  Keep the CPAP or BPAP machine and attachments clean. Ask your health care provider for specific instructions. Follow these instructions at home:  Do not use any products that contain nicotine or tobacco, such as cigarettes, e-cigarettes, and chewing tobacco. If you need help quitting, ask your health care provider.  Keep all follow-up visits as told by your health care provider. This is important. Contact a health care provider if:  You have redness or pressure sores on your head, face, mouth, or nose from the mask or head gear.  You have trouble using the CPAP or BPAP machine.  You cannot tolerate wearing the CPAP or BPAP mask.  Someone tells you that you snore even when wearing your CPAP or BPAP. Get help right away if:  You have trouble  breathing.  You feel confused. Summary  CPAP and BPAP are methods that use air pressure to keep your airways open and to help you breathe well.  You may need to have a sleep study before your health care provider can order a machine for home use.  If you have trouble with the mask, it is very important that you talk with your health care provider about finding a way to make the mask easier to tolerate. Do not stop using the mask. There could be a negative impact to your health if you stop using the mask.  Follow instructions from your health care provider about when to use the machine. This information is not intended to replace advice given to you by your health care provider. Make sure you discuss any questions you have with your health care provider. Document Revised: 03/12/2019 Document Reviewed: 03/15/2019 Elsevier Patient Education  2021  [employer].
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
## 2021-08-30 IMAGING — CR DG ANKLE COMPLETE 3+V*L*
3 series · 3 of 3 positions shown · non-contrast
Comparison: Left foot radiograph dated 06/16/2014.

CLINICAL DATA: 43-year-old female with left ankle pain. No known
injury.

EXAM:
LEFT ANKLE COMPLETE - 3+ VIEW

[ankle obl]
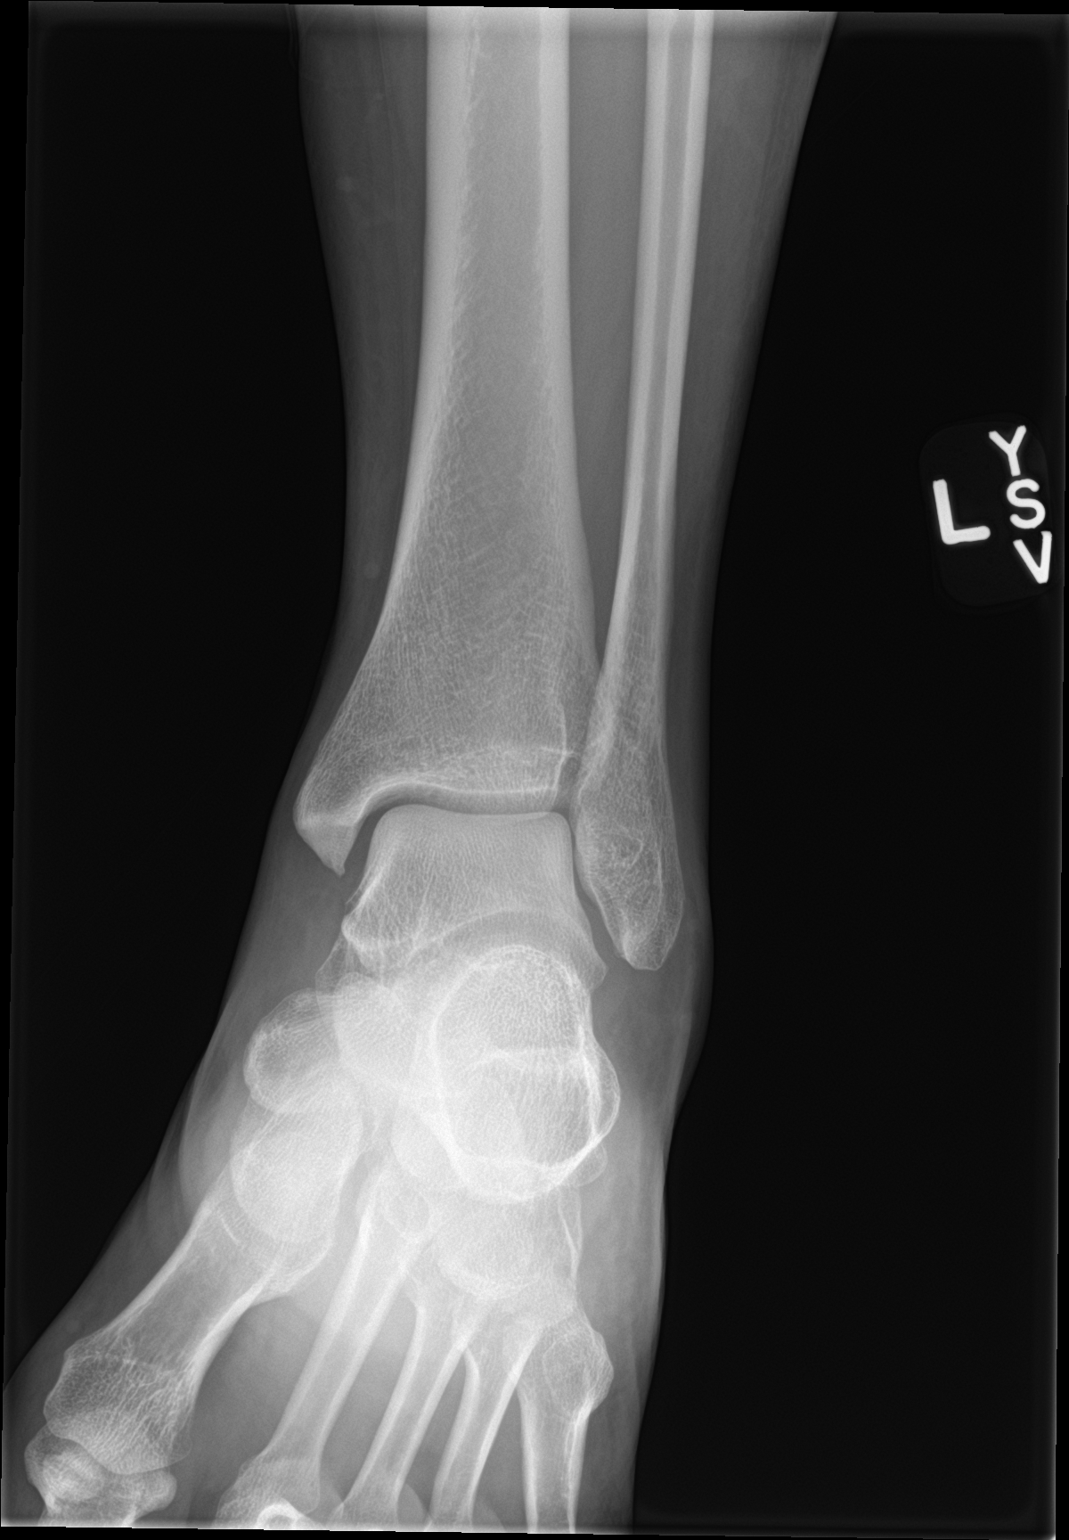

[ankle lat]
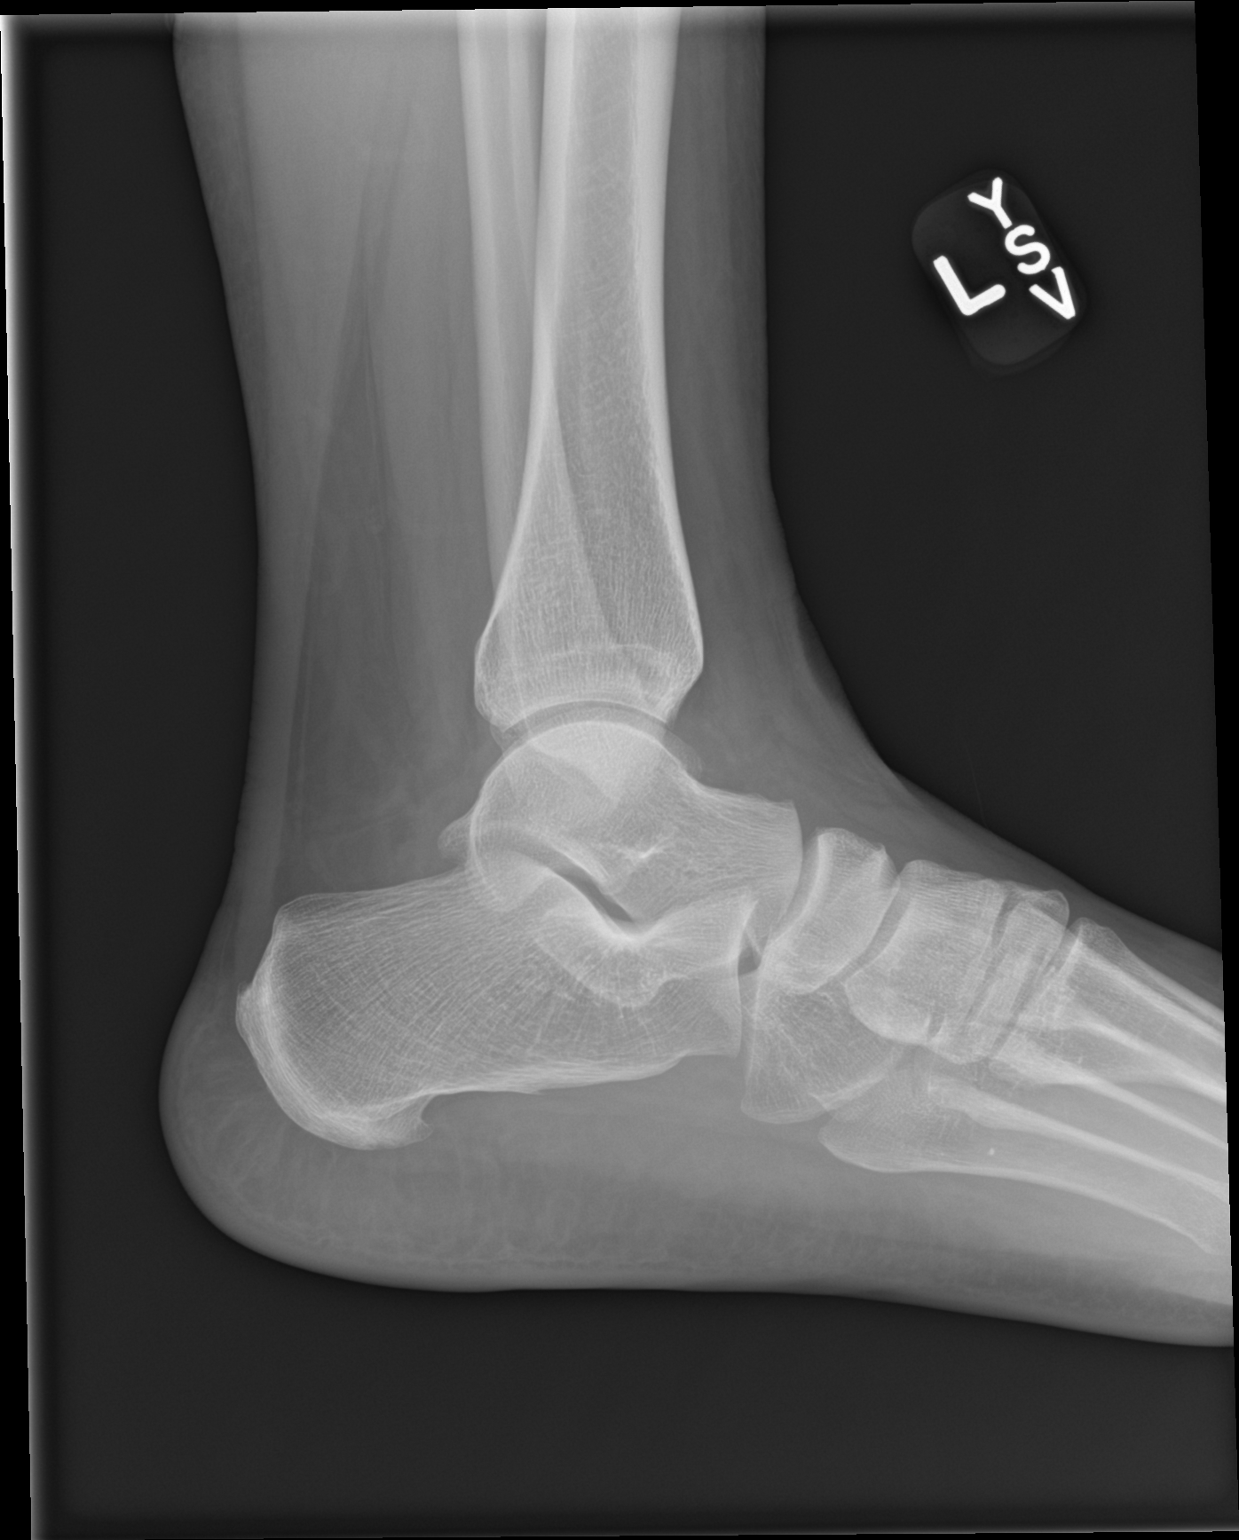

[ankle ap]
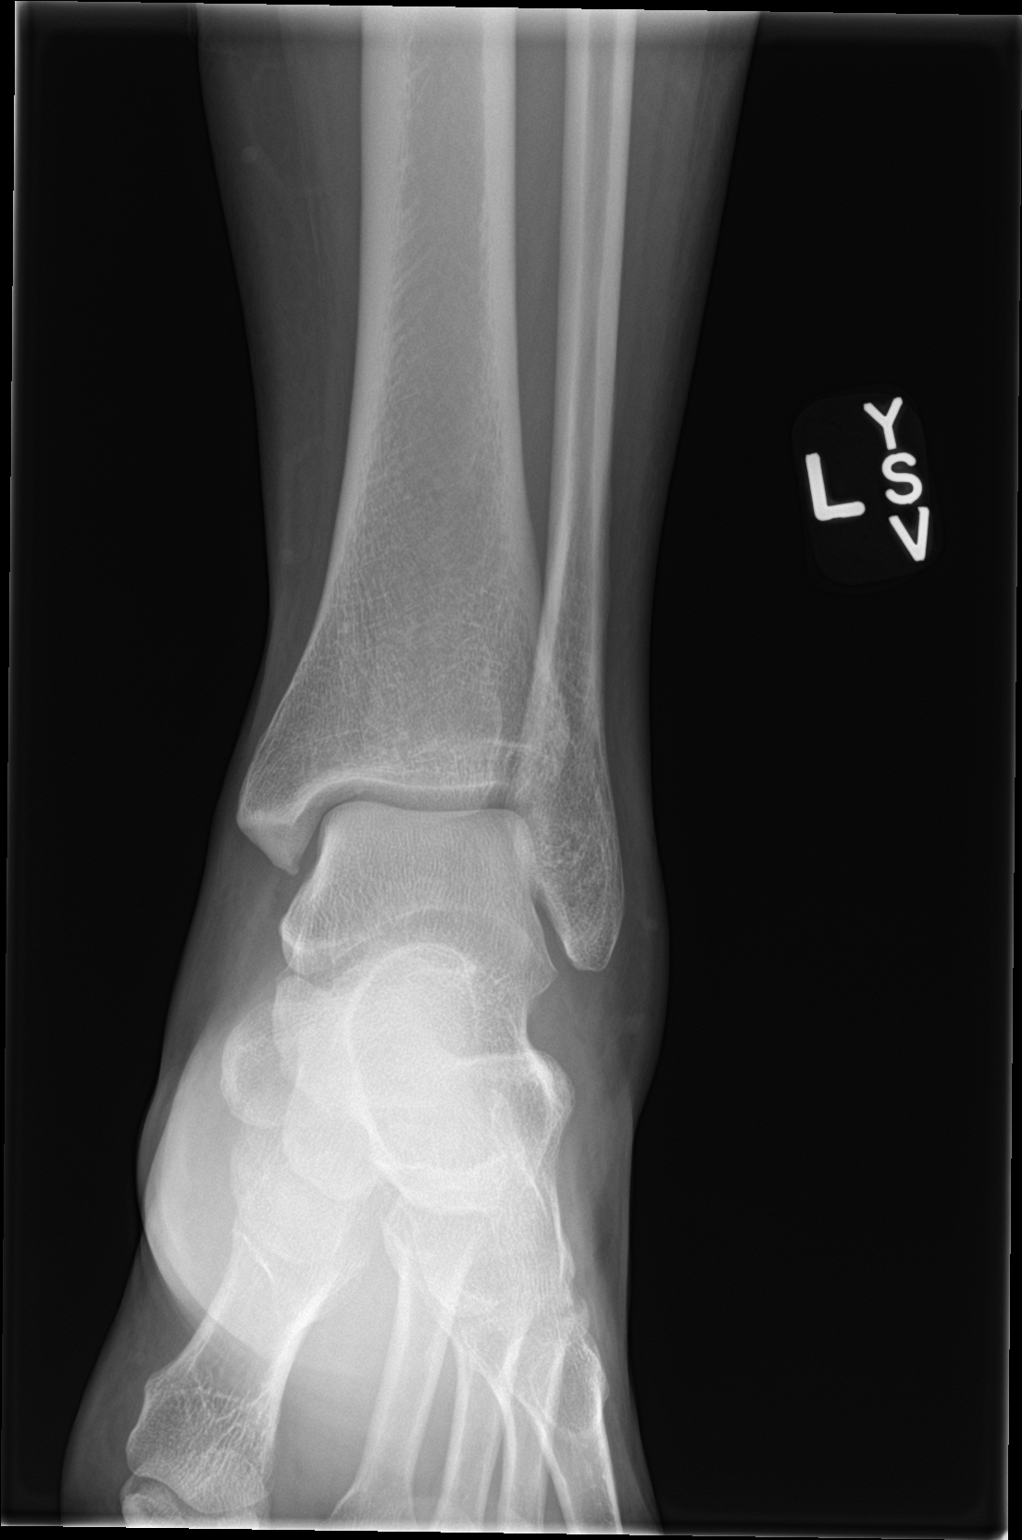

[3 of 3 positions shown; findings below may reference images not displayed]

FINDINGS: There is no acute fracture or dislocation. The bones are well
mineralized. No arthritic changes. The ankle mortise is intact. The
soft tissues are unremarkable.
IMPRESSION: Negative.

## 2021-10-10 ENCOUNTER — Encounter (INDEPENDENT_AMBULATORY_CARE_PROVIDER_SITE_OTHER): Payer: Self-pay

## 2021-10-22 ENCOUNTER — Ambulatory Visit
Admission: RE | Admit: 2021-10-22 | Discharge: 2021-10-22 | Disposition: A | Discharge status: Home / Self Care | Payer: BC Managed Care – PPO | Source: Ambulatory Visit

## 2021-10-22 ENCOUNTER — Ambulatory Visit: Payer: BC Managed Care – PPO

## 2021-10-22 ENCOUNTER — Ambulatory Visit (INDEPENDENT_AMBULATORY_CARE_PROVIDER_SITE_OTHER): Payer: BC Managed Care – PPO

## 2021-10-22 VITALS — BP 134/87 | HR 62 | Temp 98.4°F | Resp 18

## 2021-10-22 DIAGNOSIS — S5010XA Contusion of unspecified forearm, initial encounter: Secondary | ICD-10-CM

## 2021-10-22 DIAGNOSIS — M79631 Pain in right forearm: Secondary | ICD-10-CM

## 2021-10-22 NOTE — Discharge Instructions (Addendum)
The x-ray of your right forearm did not reveal any acute bony injury.  The radiologist feels that there is evidence of a hematoma present, however.    A hematoma is a pocket of blood that accumulates after an acute injury damages a blood vessel.  Hematomas can sometimes take up to 3 months to resolve.  The hematoma is also the most likely reason that you have swelling and pain in the area of your injury, the pressure is causing you to temporarily lose nerve sensation.  The numbness will go away as the swelling goes down.  I recommend that you consider taking ibuprofen 400 mg to 600 mg 3 times daily for pain and swelling.  You are also welcome to apply heat to the area several times daily to bring more circulation and help the hematoma reabsorb faster.  No further interventions are recommended or indicated.  Thank you for visiting urgent care today.

## 2021-10-22 NOTE — ED Triage Notes (Signed)
The patient states at work she got the skin of her Rt FA caught on a conveyer belt. The area is bruised and swollnen, the patient states she does not have feeling the the area that was caught.  Happened: Friday morning

## 2021-10-22 NOTE — ED Provider Notes (Signed)
[facility]    CSN: [id number] Arrival date & time: 10/22/21  1435    HISTORY   Chief Complaint  Patient presents with   Arm Injury    Entered by patient   HPI Morgan [name] is a pleasant, 46 y.o. female who presents to urgent care today. Patient complains of getting her right forearm caught in a conveyor belt at work 4 days ago.  Patient states that the area where her arm was caught is not bruised and swollen and that she does not have any feeling in that area.  Patient states she has not tried anything to improve her symptoms.  Patient denies prior injury to her right forearm.  The history is provided by the patient.   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   Other secondary hypertension 12/28/2018   Nausea 12/28/2018   Moderate obstructive sleep apnea-hypopnea syndrome 12/28/2018   Chronic migraine w/o aura w/o status migrainosus, not intractable 05/31/2015   Major depressive disorder, recurrent, severe without psychotic features (HCC)    MDD (major depressive disorder), recurrent episode, severe (HCC) 11/30/2014   Ureterolithiasis 04/19/2014   Migraine 03/18/2013   OSA on CPAP 03/18/2013   Past Surgical History:  Procedure Laterality Date   CERVICAL CERCLAGE     all 3 pregnancies   DILATION AND CURETTAGE OF UTERUS  2001   retained placenta   DILATION AND CURETTAGE OF UTERUS  2006   SAB   MOUTH SURGERY  1999   TUBAL LIGATION     OB History     Gravida  7   Para  3   Term  1   Preterm  2   AB  4   Living  1      SAB  2   IAB  2   Ectopic  0   Multiple  0   Live Births  3          Home Medications    Prior to Admission medications   Medication Sig Start Date End Date Taking?  Authorizing Provider  estrogens, conjugated, (PREMARIN) 0.625 MG tablet Take 0.625 mg by mouth daily. Take daily for 21 days then do not take for 7 days.    [provider]  Fremanezumab-vfrm 225 MG/1.5ML SOAJ Inject 225 mg into the skin every 30 (thirty) days. 03/30/20   Lomax, Amy, NP  ibuprofen (ADVIL,MOTRIN) 200 MG tablet Take 400-800 mg by mouth every 6 (six) hours as needed for headache.    [provider]  losartan (COZAAR) 25 MG tablet Take 25 mg by mouth daily.    [provider]  ondansetron (ZOFRAN-ODT) 4 MG disintegrating tablet Take 1 tablet (4 mg total) by mouth every 8 (eight) hours as needed for nausea. 08/10/17   Anson Fret, MD  progesterone (PROMETRIUM) 100 MG capsule Take 100 mg by mouth daily.    [provider]  Ubrogepant (UBRELVY) 50 MG TABS Take 50 mg by mouth daily as needed. Take one tablet at onset of headache, may repeat 1 tablet in 2 hours, no more than 2 tablets in 24 hours 03/30/20   Shawnie Dapper, NP  Family History Family History  Problem Relation Age of Onset   HIV Mother    Seizures Mother        secondary to drug abuse   Drug abuse Mother    Heart disease Maternal Grandfather    Diabetes Maternal Grandfather    Cancer Maternal Grandfather    Diabetes Paternal Grandmother    Cancer Paternal Grandfather    Heart disease Maternal Grandmother    Hypertension Maternal Grandmother    Migraines Neg Hx    Social History Social History   Tobacco Use   Smoking status: Never   Smokeless tobacco: Never  Vaping Use   Vaping Use: Never used  Substance Use Topics   Alcohol use: Yes    Alcohol/week: 0.0 standard drinks of alcohol    Comment: every once in awhile   Drug use: Yes    Frequency: 7.0 times per week    Types: Marijuana    Comment: last use 09/13/15; as of 08/07/17 still smokes daily   Allergies   Stadol [butorphanol tartrate] and Kale  Review of Systems Review of Systems Pertinent findings revealed after  performing a 14 point review of systems has been noted in the history of present illness.  Physical Exam Triage Vital Signs ED Triage Vitals  Enc Vitals Group     BP 12/29/20 0827 (!) 147/82     Pulse Rate 12/29/20 0827 72     Resp 12/29/20 0827 18     Temp 12/29/20 0827 98.3 F (36.8 C)     Temp Source 12/29/20 0827 Oral     SpO2 12/29/20 0827 98 %     Weight --      Height --      Head Circumference --      Peak Flow --      Pain Score 12/29/20 0826 5     Pain Loc --      Pain Edu? --      Excl. in GC? --    Updated Vital Signs BP 134/87 (BP Location: Left Arm)   Pulse 62   Temp 98.4 F (36.9 C) (Oral)   Resp 18   LMP 11/12/2018 (Approximate) Comment: Pt states tube ablation as well  SpO2 96%   Physical Exam Vitals and nursing note reviewed.  Constitutional:      General: She is not in acute distress.    Appearance: Normal appearance.  HENT:     Head: Normocephalic and atraumatic.  Eyes:     Pupils: Pupils are equal, round, and reactive to light.  Cardiovascular:     Rate and Rhythm: Normal rate and regular rhythm.  Pulmonary:     Effort: Pulmonary effort is normal.     Breath sounds: Normal breath sounds.  Musculoskeletal:        General: Normal range of motion.     Cervical back: Normal range of motion and neck supple.  Skin:    General: Skin is warm and dry.     Findings: Bruising (Right forearm) present.  Neurological:     General: No focal deficit present.     Mental Status: She is alert and oriented to person, place, and time. Mental status is at baseline.  Psychiatric:        Mood and Affect: Mood normal.        Behavior: Behavior normal.        Thought Content: Thought content normal.        Judgment: Judgment normal.  [facility] Couse / Diagnostics / Procedures:     Radiology DG Forearm Right  Result Date: 10/22/2021 CLINICAL DATA:  Trauma, pain EXAM: RIGHT FOREARM - 2 VIEW COMPARISON:  None Available. FINDINGS: No fracture or dislocation is  seen. There is soft tissue density in subcutaneous plane in the medial aspect of proximal forearm, possibly suggesting hematoma. IMPRESSION: No fracture or dislocation is seen in right forearm. Electronically Signed   By: Ernie Avena M.D.   On: 10/22/2021 15:48    Procedures Procedures (including critical care time) EKG  Pending results:  Labs Reviewed - No data to display  Medications Ordered in UC: Medications - No data to display  UC Diagnoses / Final Clinical Impressions(s)   I have reviewed the triage vital signs and the nursing notes.  Pertinent labs & imaging results that were available during my care of the patient were reviewed by me and considered in my medical decision making (see chart for details).    Final diagnoses:  Traumatic hematoma of forearm, initial encounter   Patient advised of x-ray findings.  Recommend heat to the area, ibuprofen as needed for pain.  ED Prescriptions   None    PDMP not reviewed this encounter.  Discharge Instructions:   Discharge Instructions      The x-ray of your right forearm did not reveal any acute bony injury.  The radiologist feels that there is evidence of a hematoma present, however.    A hematoma is a pocket of blood that accumulates after an acute injury damages a blood vessel.  Hematomas can sometimes take up to 3 months to resolve.  The hematoma is also the most likely reason that you have swelling and pain in the area of your injury, the pressure is causing you to temporarily lose nerve sensation.  The numbness will go away as the swelling goes down.  I recommend that you consider taking ibuprofen 400 mg to 600 mg 3 times daily for pain and swelling.  You are also welcome to apply heat to the area several times daily to bring more circulation and help the hematoma reabsorb faster.  No further interventions are recommended or indicated.  Thank you for visiting urgent care today.      Disposition Upon  Discharge:  Condition: stable for discharge home Home: take medications as prescribed; routine discharge instructions as discussed; follow up as advised.  Patient presented with an acute illness with associated systemic symptoms and significant discomfort requiring urgent management. In my opinion, this is a condition that a prudent lay person (someone who possesses an average knowledge of health and medicine) may potentially expect to result in complications if not addressed urgently such as respiratory distress, impairment of bodily function or dysfunction of bodily organs.   Routine symptom specific, illness specific and/or disease specific instructions were discussed with the patient and/or caregiver at length.   As such, the patient has been evaluated and assessed, work-up was performed and treatment was provided in alignment with urgent care protocols and evidence based medicine.  Patient/parent/caregiver has been advised that the patient may require follow up for further testing and treatment if the symptoms continue in spite of treatment, as clinically indicated and appropriate.  Patient/parent/caregiver has been advised to report to orthopedic urgent care clinic or return to the [facility] or PCP in 3-5 days if no better; follow-up with orthopedics, PCP or the Emergency Department if new signs and symptoms develop or if the current signs or symptoms continue to change or  worsen for further workup, evaluation and treatment as clinically indicated and appropriate  The patient will follow up with their current PCP if and as advised. If the patient does not currently have a PCP we will have assisted them in obtaining one.   The patient may need specialty follow up if the symptoms continue, in spite of conservative treatment and management, for further workup, evaluation, consultation and treatment as clinically indicated and appropriate.  Patient/parent/caregiver verbalized understanding and agreement  of plan as discussed.  All questions were addressed during visit.  Please see discharge instructions below for further details of plan.  This office note has been dictated using Teaching laboratory technician.  Unfortunately, this method of dictation can sometimes lead to typographical or grammatical errors.  I apologize for your inconvenience in advance if this occurs.  Please do not hesitate to reach out to me if clarification is needed.      Theadora Rama Scales, PA-C 10/22/21 1556

## 2022-04-21 ENCOUNTER — Ambulatory Visit
Admission: EM | Admit: 2022-04-21 | Discharge: 2022-04-21 | Disposition: A | Discharge status: Home / Self Care | Payer: BC Managed Care – PPO | Attending: Urgent Care | Admitting: Urgent Care

## 2022-04-21 DIAGNOSIS — M7062 Trochanteric bursitis, left hip: Secondary | ICD-10-CM

## 2022-04-21 MED ORDER — PREDNISONE 20 MG PO TABS
ORAL_TABLET | ORAL | 0 refills | Status: DC
Start: 2022-04-21 — End: 2022-06-25

## 2022-04-21 NOTE — ED Provider Notes (Signed)
[facility]  Note:  This document was prepared using Systems analyst and may include unintentional dictation errors.  MRN: [id number] DOB: [birth year]  Subjective:   Morgan [name] is a 47 y.o. female presenting for 3 day history of acute onset persistent and worsening left hip pain.  There was no trauma, fall, rash, redness, hot sensation.  Patient works with [employer] and has to do a lot of walking, standing, lifting, crouching and squatting.  She had a particularly heavy loading day just before her symptoms started.  No current facility-administered medications for this encounter.  Current Outpatient Medications:    estrogens, conjugated, (PREMARIN) 0.625 MG tablet, Take 0.625 mg by mouth daily. Take daily for 21 days then do not take for 7 days., Disp: , Rfl:    Fremanezumab-vfrm 225 MG/1.5ML SOAJ, Inject 225 mg into the skin every 30 (thirty) days., Disp: 1.68 mL, Rfl: 5   ibuprofen (ADVIL,MOTRIN) 200 MG tablet, Take 400-800 mg by mouth every 6 (six) hours as needed for headache., Disp: , Rfl:    metroNIDAZOLE (METROGEL) 0.75 % vaginal gel, Place vaginally at bedtime., Disp: , Rfl:    ondansetron (ZOFRAN-ODT) 4 MG disintegrating tablet, Take 1 tablet (4 mg total) by mouth every 8 (eight) hours as needed for nausea., Disp: 60 tablet, Rfl: 11   progesterone (PROMETRIUM) 100 MG capsule, Take 100 mg by mouth daily., Disp: , Rfl:    progesterone (PROMETRIUM) 100 MG capsule, Take 100 mg by mouth at bedtime., Disp: , Rfl:    spironolactone (ALDACTONE) 25 MG tablet, Take 25 mg by mouth every morning., Disp: , Rfl:    Ubrogepant (UBRELVY) 50 MG TABS, Take 50 mg by mouth daily as needed. Take one tablet at onset of headache, may repeat 1 tablet in 2 hours, no more than 2 tablets in 24 hours, Disp: 10 tablet, Rfl: 11   Allergies  Allergen Reactions   Stadol [Butorphanol Tartrate] Itching   Kale Itching and Rash    Past Medical History:  Diagnosis Date    Abortion 03/09/2013   Allergy    Anxiety    Asthma    Depression    Headache    Kidney stone    Migraines    Pregnancy induced hypertension    Sleep apnea      Past Surgical History:  Procedure Laterality Date   CERVICAL CERCLAGE     all 3 pregnancies   DILATION AND CURETTAGE OF UTERUS  2001   retained placenta   DILATION AND CURETTAGE OF UTERUS  2006   SAB   MOUTH SURGERY  1999   TUBAL LIGATION      Family History  Problem Relation Age of Onset   HIV Mother    Seizures Mother        secondary to drug abuse   Drug abuse Mother    Heart disease Maternal Grandfather    Diabetes Maternal Grandfather    Cancer Maternal Grandfather    Diabetes Paternal Grandmother    Cancer Paternal Grandfather    Heart disease Maternal Grandmother    Hypertension Maternal Grandmother    Migraines Neg Hx     Social History   Tobacco Use   Smoking status: Every Day    Types: Cigars   Smokeless tobacco: Never  Vaping Use   Vaping Use: Never used  Substance Use Topics   Alcohol use: Yes    Comment: occ   Drug use: Yes    Types: Marijuana  ROS   Objective:   Vitals: BP (!) 142/104 (BP Location: Right Arm)   Pulse 66   Temp 98.9 F (37.2 C) (Oral)   Resp 18   LMP 11/12/2018 (Approximate) Comment: Pt states tube ablation as well  SpO2 97%   BP Readings from Last 3 Encounters:  04/21/22 (!) 142/104  10/22/21 134/87  03/30/20 137/89   Physical Exam Constitutional:      General: She is not in acute distress.    Appearance: Normal appearance. She is well-developed. She is not ill-appearing, toxic-appearing or diaphoretic.  HENT:     Head: Normocephalic and atraumatic.     Nose: Nose normal.     Mouth/Throat:     Mouth: Mucous membranes are moist.  Eyes:     General: No scleral icterus.       Right eye: No discharge.        Left eye: No discharge.     Extraocular Movements: Extraocular movements intact.  Cardiovascular:     Rate and Rhythm: Normal rate.   Pulmonary:     Effort: Pulmonary effort is normal.  Musculoskeletal:     Left hip: Tenderness (focal over area outlined) present. No deformity, lacerations, bony tenderness or crepitus. Normal range of motion. Normal strength.       Legs:  Skin:    General: Skin is warm and dry.  Neurological:     General: No focal deficit present.     Mental Status: She is alert and oriented to person, place, and time.  Psychiatric:        Mood and Affect: Mood normal.        Behavior: Behavior normal.     Assessment and Plan :   PDMP not reviewed this encounter.  1. Trochanteric bursitis of left hip     Denies history of hypertension.  Patient reports that she just found out that her insurance lapsed and feels that this is stressing her.  Recommended monitoring her blood pressure.  Patient declined to use NSAIDs, prefers to use prednisone as a steroid as she does not want to miss work.  Recommended she follow-up with an orthopedist soon as possible.  Deferred imaging for now.  Counseled patient on potential for adverse effects with medications prescribed/recommended today, ER and return-to-clinic precautions discussed, patient verbalized understanding.    Jaynee Eagles, [place] 04/21/22 [id number]

## 2022-04-21 NOTE — ED Triage Notes (Signed)
Pt c/o left hip pain x 3 days-denies injury-limping gait-NAD

## 2022-06-25 ENCOUNTER — Other Ambulatory Visit: Payer: Self-pay

## 2022-06-25 ENCOUNTER — Encounter: Payer: Self-pay | Admitting: Emergency Medicine

## 2022-06-25 ENCOUNTER — Ambulatory Visit
Admission: EM | Admit: 2022-06-25 | Discharge: 2022-06-25 | Disposition: A | Discharge status: Home / Self Care | Payer: BC Managed Care – PPO | Attending: Physician Assistant | Admitting: Physician Assistant

## 2022-06-25 DIAGNOSIS — G43E11 Chronic migraine with aura, intractable, with status migrainosus: Secondary | ICD-10-CM | POA: Diagnosis not present

## 2022-06-25 MED ORDER — PREDNISONE 20 MG PO TABS: ORAL_TABLET | ORAL | 0 refills | Status: DC

## 2022-06-25 MED ORDER — BACLOFEN 10 MG PO TABS
10.0000 mg | ORAL_TABLET | Freq: Two times a day (BID) | ORAL | 0 refills | Status: DC | PRN
Start: 2022-06-25 — End: 2023-06-23

## 2022-06-25 NOTE — ED Triage Notes (Signed)
Pt here for HA that is similar to migraine; pt sts has had migraine almost every day for a month

## 2022-06-25 NOTE — Discharge Instructions (Signed)
Start prednisone 40 mg in the morning for 5 days.  Do not take NSAIDs with this medication including aspirin, ibuprofen/Advil, naproxen/Aleve.  I would also recommend holding your Mobic/meloxicam while on this medicine.  You can use acetaminophen/Tylenol.  Take baclofen up to twice a day.  This will make you sleepy so do not drive or drink alcohol while taking it.  Make sure that you are resting and drinking plenty of fluid.  Follow-up with your neurologist as scheduled.  If anything worsens you have worsening headache, the worst headache of your life, weakness, visual change, nausea/vomiting interfering with oral intake you need to go to the emergency room.

## 2022-06-25 NOTE — ED Provider Notes (Signed)
[name]    [name] Arrival date & time: 06/25/22  1510      History   Chief Complaint Chief Complaint  Patient presents with   Headache    HPI Morgan [name] is a 47 y.o. female.   Patient presents today with a month-long history of recurrent migraine symptoms.  Reports that she has had a migraine every day except for probably 3 days of the last month.  She does have a history of migraine and states current symptoms are similar to previous episodes of this condition.  Headache pain is rated 8 on a 0-10 pain scale, described as pressure/aching, localized to bitemporal region, no alleviating factors identified.  She has seen her primary care who has prescribed her Osie Bond but this has been ineffective in managing her symptoms.  She is followed by neurologist but does not have an appointment with them until next month.  She was previously prescribed prophylactic medication (Ajovy) but has been without this for several months.  She does report a visual disturbance described as stars prior to getting headaches.  She denies any recent medication changes, diet changes, head injuries.  This is not the worst headache of her life.    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   Other secondary hypertension 12/28/2018   Nausea 12/28/2018   Moderate obstructive sleep apnea-hypopnea syndrome 12/28/2018   Chronic migraine w/o aura w/o status migrainosus, not intractable 05/31/2015   Major depressive disorder, recurrent, severe without psychotic features (HCC)    MDD (major depressive disorder), recurrent episode, severe 11/30/2014   Ureterolithiasis 04/19/2014   Migraine 03/18/2013   OSA on CPAP 03/18/2013    Past  Surgical History:  Procedure Laterality Date   CERVICAL CERCLAGE     all 3 pregnancies   DILATION AND CURETTAGE OF UTERUS  2001   retained placenta   DILATION AND CURETTAGE OF UTERUS  2006   SAB   MOUTH SURGERY  1999   TUBAL LIGATION      OB History     Gravida  7   Para  3   Term  1   Preterm  2   AB  4   Living  1      SAB  2   IAB  2   Ectopic  0   Multiple  0   Live Births  3            Home Medications    Prior to Admission medications   Medication Sig Start Date End Date Taking? Authorizing Provider  baclofen (LIORESAL) 10 MG tablet Take 1 tablet (10 mg total) by mouth 2 (two) times daily as needed for muscle spasms. 06/25/22  Yes Davyon Fisch K, PA-C  estrogens, conjugated, (PREMARIN) 0.625 MG tablet Take 0.625 mg by mouth daily. Take daily for 21 days then do not take for 7 days.    [provider]  Fremanezumab-vfrm 225 MG/1.5ML SOAJ Inject 225 mg into the skin every 30 (thirty) days. 03/30/20   Lomax, Amy, NP  ibuprofen (ADVIL,MOTRIN) 200 MG tablet Take 400-800 mg by mouth every 6 (six) hours as needed for headache.  [provider]  metroNIDAZOLE (METROGEL) 0.75 % vaginal gel Place vaginally at bedtime. Patient not taking: Reported on 06/25/2022 10/11/21   [provider]  ondansetron (ZOFRAN-ODT) 4 MG disintegrating tablet Take 1 tablet (4 mg total) by mouth every 8 (eight) hours as needed for nausea. 08/10/17   Anson Fret, MD  predniSONE (DELTASONE) 20 MG tablet Take 2 tablets daily with breakfast. 06/25/22   Arrion Broaddus, Noberto Retort, PA-C  progesterone (PROMETRIUM) 100 MG capsule Take 100 mg by mouth daily.    [provider]  progesterone (PROMETRIUM) 100 MG capsule Take 100 mg by mouth at bedtime. 06/25/21   [provider]  spironolactone (ALDACTONE) 25 MG tablet Take 25 mg by mouth every morning. 09/28/21   [provider]  Ubrogepant (UBRELVY) 50 MG TABS Take 50 mg by mouth daily as needed. Take  one tablet at onset of headache, may repeat 1 tablet in 2 hours, no more than 2 tablets in 24 hours 03/30/20   Lomax, Amy, NP    Family History Family History  Problem Relation Age of Onset   HIV Mother    Seizures Mother        secondary to drug abuse   Drug abuse Mother    Heart disease Maternal Grandfather    Diabetes Maternal Grandfather    Cancer Maternal Grandfather    Diabetes Paternal Grandmother    Cancer Paternal Grandfather    Heart disease Maternal Grandmother    Hypertension Maternal Grandmother    Migraines Neg Hx     Social History Social History   Tobacco Use   Smoking status: Every Day    Types: Cigars   Smokeless tobacco: Never  Vaping Use   Vaping Use: Never used  Substance Use Topics   Alcohol use: Yes    Comment: occ   Drug use: Yes    Types: Marijuana     Allergies   Stadol [butorphanol tartrate] and Kale   Review of Systems Review of Systems  Constitutional:  Positive for activity change. Negative for appetite change, fatigue and fever.  Eyes:  Negative for photophobia and visual disturbance.  Respiratory:  Negative for cough and shortness of breath.   Cardiovascular:  Negative for chest pain.  Gastrointestinal:  Positive for nausea. Negative for abdominal pain, diarrhea and vomiting.  Musculoskeletal:  Negative for arthralgias and myalgias.  Neurological:  Positive for headaches. Negative for dizziness, syncope, facial asymmetry, speech difficulty, weakness, light-headedness and numbness.     Physical Exam Triage Vital Signs ED Triage Vitals  Enc Vitals Group     BP 06/25/22 1553 126/84     Pulse Rate 06/25/22 1553 70     Resp 06/25/22 1553 18     Temp 06/25/22 1553 98.3 F (36.8 C)     Temp Source 06/25/22 1553 Oral     SpO2 06/25/22 1553 97 %     Weight --      Height --      Head Circumference --      Peak Flow --      Pain Score 06/25/22 1554 7     Pain Loc --      Pain Edu? --      Excl. in GC? --    No data  found.  Updated Vital Signs BP 126/84 (BP Location: Left Arm)   Pulse 70   Temp 98.3 F (36.8 C) (Oral)   Resp 18   LMP 11/12/2018 (Approximate) Comment: Pt states tube ablation as  well  SpO2 97%   Visual Acuity Right Eye Distance:   Left Eye Distance:   Bilateral Distance:    Right Eye Near:   Left Eye Near:    Bilateral Near:     Physical Exam Vitals reviewed.  Constitutional:      General: She is awake. She is not in acute distress.    Appearance: Normal appearance. She is well-developed. She is not ill-appearing.     Comments: Very pleasant female appears stated age in no acute distress sitting comfortably in exam room  HENT:     Head: Normocephalic and atraumatic. No raccoon eyes, Battle's sign or contusion.     Right Ear: Tympanic membrane, ear canal and external ear normal. No hemotympanum.     Left Ear: Tympanic membrane, ear canal and external ear normal. No hemotympanum.     Nose: Nose normal.     Mouth/Throat:     Tongue: Tongue does not deviate from midline.     Pharynx: Uvula midline. No oropharyngeal exudate or posterior oropharyngeal erythema.  Eyes:     Extraocular Movements: Extraocular movements intact.     Conjunctiva/sclera: Conjunctivae normal.     Pupils: Pupils are equal, round, and reactive to light.  Cardiovascular:     Rate and Rhythm: Normal rate and regular rhythm.     Heart sounds: Normal heart sounds, S1 normal and S2 normal. No murmur heard. Pulmonary:     Effort: Pulmonary effort is normal.     Breath sounds: Normal breath sounds. No wheezing, rhonchi or rales.     Comments: Clear to auscultation bilaterally Abdominal:     General: Bowel sounds are normal.     Palpations: Abdomen is soft.     Tenderness: There is no abdominal tenderness.  Musculoskeletal:     Cervical back: No spinous process tenderness or muscular tenderness.     Comments: Strength 5/5 bilateral upper and lower extremities  Neurological:     General: No focal  deficit present.     Mental Status: She is alert and oriented to person, place, and time.     Cranial Nerves: Cranial nerves 2-12 are intact.     Motor: Motor function is intact.     Coordination: Coordination is intact.     Gait: Gait is intact.     Comments: Cranial nerves II to XII grossly intact.  No focal neurological defect on exam.  Psychiatric:        Behavior: Behavior is cooperative.      UC Treatments / Results  Labs (all labs ordered are listed, but only abnormal results are displayed) Labs Reviewed - No data to display  EKG   Radiology No results found.  Procedures Procedures (including critical care time)  Medications Ordered in UC Medications - No data to display  Initial Impression / Assessment and Plan / UC Course  I have reviewed the triage vital signs and the nursing notes.  Pertinent labs & imaging results that were available during my care of the patient were reviewed by me and considered in my medical decision making (see chart for details).     Patient is well-appearing, afebrile, nontoxic, nontachycardic.  Physical exam is reassuring today with no focal neurological defect that would warrant emergent evaluation or imaging.  She was started on prednisone burst with instructions to take NSAIDs with this medication due to risk of GI bleeding.  Will also use baclofen to help manage her headache pain but discussed this can be sedating and  she is not to drive or drink alcohol with taking it.  She can use over-the-counter medications including acetaminophen for additional symptom relief.  Recommend that she rest and drink plenty of fluid.  We discussed that she should follow-up closely with her neurologist for ongoing treatment.  If she has any worsening or changing symptoms including increasing pain, worst headache of her life, nausea/vomiting interfering with oral intake, weakness, visual disturbance that she needs to go to the emergency room.  Strict return  precautions given.  She was provided a work excuse note.  Final Clinical Impressions(s) / UC Diagnoses   Final diagnoses:  Intractable chronic migraine with aura with status migrainosus     Discharge Instructions      Start prednisone 40 mg in the morning for 5 days.  Do not take NSAIDs with this medication including aspirin, ibuprofen/Advil, naproxen/Aleve.  I would also recommend holding your Mobic/meloxicam while on this medicine.  You can use acetaminophen/Tylenol.  Take baclofen up to twice a day.  This will make you sleepy so do not drive or drink alcohol while taking it.  Make sure that you are resting and drinking plenty of fluid.  Follow-up with your neurologist as scheduled.  If anything worsens you have worsening headache, the worst headache of your life, weakness, visual change, nausea/vomiting interfering with oral intake you need to go to the emergency room.     ED Prescriptions     Medication Sig Dispense Auth. Provider   predniSONE (DELTASONE) 20 MG tablet Take 2 tablets daily with breakfast. 10 tablet Gavynn Duvall K, PA-C   baclofen (LIORESAL) 10 MG tablet Take 1 tablet (10 mg total) by mouth 2 (two) times daily as needed for muscle spasms. 10 each Rayan Dyal, Noberto Retort, PA-C      PDMP not reviewed this encounter.   Jeani Hawking, PA-C 06/25/22 1615

## 2022-07-01 ENCOUNTER — Telehealth (INDEPENDENT_AMBULATORY_CARE_PROVIDER_SITE_OTHER): Payer: BC Managed Care – PPO | Admitting: Neurology

## 2022-07-01 DIAGNOSIS — G43709 Chronic migraine without aura, not intractable, without status migrainosus: Secondary | ICD-10-CM

## 2022-07-01 MED ORDER — AJOVY 225 MG/1.5ML ~~LOC~~ SOAJ: 225.0000 mg | SUBCUTANEOUS | 11 refills | Status: DC

## 2022-07-01 NOTE — Progress Notes (Unsigned)
[name]    Provider:  Dr Lucia Gaskins Referring Provider: No ref. provider found Primary Care Physician:  Patient, No Pcp Per  Virtual Visit via Video Note  I connected with Morgan [name] on 07/02/22 at 11:00 AM EDT by a video enabled telemedicine application and verified that I am speaking with the correct person using two identifiers.  Location: Patient: home Provider: office   I discussed the limitations of evaluation and management by telemedicine and the availability of in person appointments. The patient expressed understanding and agreed to proceed.  Follow Up Instructions:    I discussed the assessment and treatment plan with the patient. The patient was provided an opportunity to ask questions and all were answered. The patient agreed with the plan and demonstrated an understanding of the instructions.   The patient was advised to call back or seek an in-person evaluation if the symptoms worsen or if the condition fails to improve as anticipated.  I provided 30 minutes of non-face-to-face time during this encounter.   Anson Fret, MD   CC:  Migraine  July 01, 2022: Since I saw patient in June 2019, she has seen my nurse practitioner on multiple occasions in March 2000, September 2020, March 2021, January 2022.  At the end of 2020 we diagnosed her with obstructive sleep apnea which she has had a difficult time with compliance sometimes and we started her on Aimovig and Bernita Raisin which was working for her migraines.  I am seeing her today for follow-up. Using cpap still having bad headaches, insurance stopped paying for it and Ajovy and ubrelvy. [name] and Bernita Raisin have been helping but hasn;t had a shot since 2022. Having 25 headache days a month and at least 15 migraine days a month but on Ajovy you were doing great hardly ever had a migraines.   Patient complains of symptoms per HPI as well as the following symptoms: migraine . Pertinent negatives  and positives per HPI. All others negative   Med tries include: aimovig, ubrelvy, propranolol, topamax contraindicated bc of your kidney stones, imitrex,maxalt, ketorolac, magnesium, Robaxin, Reglan, propranolol, Ajovy, Aimovig, Ubrelvy, ibuprofen, Advil, Depakote, Tylenol, Celexa, prednisone, amitriptyline, topiramate is contraindicated due to kidney stones.   See below sleep study report    1. Obstructive Sleep Apnea (OSA), mild - moderate at AHI at 21/h  with REM accentuation to AHI 76/h.    2. Primary Snoring   RECOMMENDATIONS:   1. Advise full-night, attended, CPAP titration study to optimize  therapy.   2. Plan B:  If this is not possible, will order autotitration  device with a setting for CPAP pressure of 6-16 cm water, 2 cm  EPR and heated humidity, with interface of patient's choice.     Interval history 08/07/2017: She has 8 headache days a month - 2 migraines that may persist up to  3 days. She feels Improved. She likes onzetra for acute management.  [name] helps. Discussed not to take more than 2x a day or 2-3x a week to avoid medication overuse headache. She is happy with management and frequency. Discussed the new CGRP medications and other options for preventatives. Provided samples of Onztra, she will call when she needs a refill. She no longer takes propranolol or depakote. Discussed cpap compliance, obesity, weight loss.  Interval history 05/29/2015: She has only had 2 migraines since being seen. So she is slightly improved. The Depakote may be helping a little. No side effects from the Depakote.  Shehas sleep apnea and  is non compliant with her cpap. She works days now. She takes the propranolol twice daily. The migraines are better. The imitrex spray does help. Will increase the Depakote. Will start a medrol dosepak. Will see her back in 6 weeks for follow up.Will perform nerve blocks today in the office for the acute migraine. This migraine has lasted for days and is  intractable.  HPI 04/12/2015:  Morgan [name] is a 47 y.o. female here as a referral from Dr. Katrinka Blazing for migraine. PMHx depression and anxiety. Also documented is Sleep apnea, kidney stone. Migraines started over 10 years ago. In the past few years they have worsened. She has go to the ED. They are on the left side, pounding, light sensitivity, sound sensitivity, she needs quiet and dark, smells trigger nausea and vomiting, blurred vision. She is having at least 15 headache days a month. At least 8 or 10 migraines. They can last all day. She has an occ aura but not all the time. She take propranolol and celexa. She take ibuprofen a few times a week and no medication. She has a history of kidney stone so can't use Topamax. She had tubal ligation in 2015 so we can use Depakote.   Reviewed notes, labs and imaging from outside physicians, which showed: Per primary care notes, she take ibuprofen and benadryl for her headaches. She has also c/o LBP, dizziness,abdominal pain,URI, dysuria recently.   She was seen in the ED 01/03/2015 for persistent headache for 3 days typical of her migraines, taking ibuprofen and naproxen and she has been seen in the ED multiple times with relief by migraine cocktail.   CT of the head showed no acute intracranial abnormalities including mass lesion or mass effect, hydrocephalus, extra-axial fluid collection, midline shift, hemorrhage, or acute infarction, large ischemic events (personally reviewed images)   Review of Systems: Patient complains of symptoms per HPI as well as the following symptoms: migraine. Pertinent negatives per HPI. All others negative.    Social History   Socioeconomic History   Marital status: Divorced    Spouse name: [name]   Number of children: 1   Years of education: 12   Highest education level: Not on file  Occupational History   Occupation: Development worker, community: [employer]  Tobacco Use   Smoking status: Every Day    Types: Cigars    Smokeless tobacco: Never  Vaping Use   Vaping Use: Never used  Substance and Sexual Activity   Alcohol use: Yes    Comment: occ   Drug use: Yes    Types: Marijuana   Sexual activity: Not on file  Other Topics Concern   Not on file  Social History Narrative   Lives with her daughter (born 2007).   Drinks caffeine a few times a week, not daily   Left handed   Social Determinants of Health   Financial Resource Strain: Not on file  Food Insecurity: Not on file  Transportation Needs: Not on file  Physical Activity: Not on file  Stress: Not on file  Social Connections: Not on file  Intimate Partner Violence: Not on file    Family History  Problem Relation Age of Onset   HIV Mother    Seizures Mother        secondary to drug abuse   Drug abuse Mother    Heart disease Maternal Grandfather    Diabetes Maternal Grandfather    Cancer Maternal Grandfather    Diabetes Paternal Grandmother  Cancer Paternal Grandfather    Heart disease Maternal Grandmother    Hypertension Maternal Grandmother    Migraines Neg Hx     Past Medical History:  Diagnosis Date   Abortion 03/09/2013   Allergy    Anxiety    Asthma    Depression    Headache    Kidney stone    Migraines    Pregnancy induced hypertension    Sleep apnea     Past Surgical History:  Procedure Laterality Date   CERVICAL CERCLAGE     all 3 pregnancies   DILATION AND CURETTAGE OF UTERUS  2001   retained placenta   DILATION AND CURETTAGE OF UTERUS  2006   SAB   MOUTH SURGERY  1999   TUBAL LIGATION      Current Outpatient Medications  Medication Sig Dispense Refill   Fremanezumab-vfrm (AJOVY) 225 MG/1.5ML SOAJ Inject 225 mg into the skin every 30 (thirty) days. 1.5 mL 11   baclofen (LIORESAL) 10 MG tablet Take 1 tablet (10 mg total) by mouth 2 (two) times daily as needed for muscle spasms. 10 each 0   Fremanezumab-vfrm 225 MG/1.5ML SOAJ Inject 225 mg into the skin every 30 (thirty) days. 1.68 mL 5   ibuprofen  (ADVIL,MOTRIN) 200 MG tablet Take 400-800 mg by mouth every 6 (six) hours as needed for headache.     predniSONE (DELTASONE) 20 MG tablet Take 2 tablets daily with breakfast. 10 tablet 0   Ubrogepant (UBRELVY) 50 MG TABS Take 50 mg by mouth daily as needed. Take one tablet at onset of headache, may repeat 1 tablet in 2 hours, no more than 2 tablets in 24 hours 10 tablet 11   No current facility-administered medications for this visit.    Allergies as of 07/01/2022 - Review Complete 06/25/2022  Allergen Reaction Noted   Stadol [butorphanol tartrate] Itching 07/03/2011   Kale Itching and Rash 12/14/2012    Vitals: LMP 11/12/2018 (Approximate) Comment: Pt states tube ablation as well Last Weight:  Wt Readings from Last 1 Encounters:  03/30/20 203 lb (92.1 kg)   Last Height:   Ht Readings from Last 1 Encounters:  03/30/20 5\' 5"  (1.651 m)    Physical exam: Exam: Gen: NAD, conversant      CV: . Denies palpitations or chest pain or SOB. VS: Breathing at a normal rate.  Not febrile. Eyes: Conjunctivae clear without exudates or hemorrhage  Neuro: Detailed Neurologic Exam  Speech:    Speech is normal; fluent and spontaneous with normal comprehension.  Cognition:    The patient is oriented to person, place, and time;     recent and remote memory intact;     language fluent;     normal attention, concentration,     fund of knowledge Cranial Nerves:    The pupils are equal, round, and reactive to light.  Cannot perform fundoscopic exam. Visual fields are full to finger confrontation. Extraocular movements are intact.  The face is symmetric with normal sensation. The palate elevates in the midline. Hearing intact. Voice is normal. Shoulder shrug is normal. The tongue has normal motion without fasciculations.   Coordination:    Normal finger to nose  Gait:    Normal native gait  Motor Observation:   no involuntary movements noted. Tone:    Appears normal  Posture:    Posture  is normal. normal erect    Strength:    Strength is anti-gravity and symmetric in the upper and lower limbs.  Sensation: intact to LT    Assessment/Plan:  47 year old with chronic migraines  July 01, 2022: Since I saw patient in June 2019, she has seen my nurse practitioner on multiple occasions in March 2000, September 2020, March 2021, January 2022.  At the end of 2020 we diagnosed her with obstructive sleep apnea which she has had a difficult time with compliance sometimes and we started her on Aimovig and Bernita Raisin which was working for her migraines.  I am seeing her today for follow-up. Using cpap still having bad headaches, insurance stopped paying for AImovig and [place] because she hadn't had an appointment or prescription. [name] and Bernita Raisin have been helping but hasn;t had a shot since 2022. Having 25 headache days a month and at least 15 migraine days a month but on Ajovy she was doing great hardly ever had a migraines.   - when her migraines improve on Ajovy we can prescribe Bernita Raisin but she already has some - ReStart Ajovy Sample left up front - Prescribed Ajovy - Continue Ubrelvy - Follow up migraine and one year one year - next march video - discussed compliance with cpap and reviewed report; discussed sequelae of untreated sleep apnea.  Med tries include: aimovig, ubrelvy, propranolol, topamax contraindicated bc of your kidney stones, imitrex,maxalt, ketorolac, magnesium, Robaxin, Reglan, propranolol, Ajovy, Aimovig, Ubrelvy, ibuprofen, Advil, Depakote, Tylenol, Celexa, prednisone, amitriptyline, topiramate is contraindicated due to kidney stones.  Meds ordered this encounter  Medications   Fremanezumab-vfrm (AJOVY) 225 MG/1.5ML SOAJ    Sig: Inject 225 mg into the skin every 30 (thirty) days.    Dispense:  1.5 mL    Refill:  11   .orders   Naomie Dean, MD  [facility] [address] [place], [place] [address]  Phone  [phone] Fax [phone]

## 2022-07-01 NOTE — Patient Instructions (Addendum)
Start Ajovy Sample left up front Prescribed Ajovy Continue Ubrelvy Follow up migraine and one year one year - next march video  Fremanezumab Injection What is this medication? FREMANEZUMAB (fre ma NEZ ue mab) prevents migraines. It works by blocking a substance in the body that causes migraines. It is a monoclonal antibody. This medicine may be used for other purposes; ask your health care provider or pharmacist if you have questions. COMMON BRAND NAME(S): AJOVY What should I tell my care team before I take this medication? They need to know if you have any of these conditions: An unusual or allergic reaction to fremanezumab, other medications, foods, dyes, or preservatives Pregnant or trying to get pregnant Breast-feeding How should I use this medication? This medication is injected under the skin. You will be taught how to prepare and give it. Take it as directed on the prescription label. Keep taking it unless your care team tells you to stop. It is important that you put your used needles and syringes in a special sharps container. Do not put them in a trash can. If you do not have a sharps container, call your pharmacist or care team to get one. Talk to your care team about the use of this medication in children. Special care may be needed. Overdosage: If you think you have taken too much of this medicine contact a poison control center or emergency room at once. NOTE: This medicine is only for you. Do not share this medicine with others. What if I miss a dose? If you miss a dose, take it as soon as you can. If it is almost time for your next dose, take only that dose. Do not take double or extra doses. What may interact with this medication? Interactions are not expected. This list may not describe all possible interactions. Give your health care provider a list of all the medicines, herbs, non-prescription drugs, or dietary supplements you use. Also tell them if you smoke, drink  alcohol, or use illegal drugs. Some items may interact with your medicine. What should I watch for while using this medication? Tell your care team if your symptoms do not start to get better or if they get worse. What side effects may I notice from receiving this medication? Side effects that you should report to your care team as soon as possible: Allergic reactions or angioedema--skin rash, itching or hives, swelling of the face, eyes, lips, tongue, arms, or legs, trouble swallowing or breathing Side effects that usually do not require medical attention (report to your care team if they continue or are bothersome): Pain, redness, or irritation at injection site This list may not describe all possible side effects. Call your doctor for medical advice about side effects. You may report side effects to FDA at 1-800-FDA-1088. Where should I keep my medication? Keep out of the reach of children and pets. Store in a refrigerator or at room temperature between 20 and 25 degrees C (68 and 77 degrees F). Refrigeration (preferred): Store in the refrigerator. Do not freeze. Keep in the original container until you are ready to take it. Remove the dose from the carton about 30 minutes before it is time for you to use it. If the dose is not used, it may be stored in the original container at room temperature for 7 days. Get rid of any unused medication after the expiration date. Room Temperature: This medication may be stored at room temperature for up to 7 days. Keep it in  the original container. Protect from light until time of use. If it is stored at room temperature, get rid of any unused medication after 7 days or after it expires, whichever is first. To get rid of medications that are no longer needed or have expired: Take the medication to a medication take-back program. Check with your pharmacy or law enforcement to find a location. If you cannot return the medication, ask your pharmacist or care team  how to get rid of this medication safely. NOTE: This sheet is a summary. It may not cover all possible information. If you have questions about this medicine, talk to your doctor, pharmacist, or health care provider.  2023 Elsevier/Gold Standard (2021-04-10 00:00:00)

## 2022-07-02 ENCOUNTER — Encounter: Payer: Self-pay | Admitting: Neurology

## 2022-07-10 ENCOUNTER — Ambulatory Visit: Payer: BC Managed Care – PPO | Admitting: Neurology

## 2022-08-16 ENCOUNTER — Ambulatory Visit
Admission: EM | Admit: 2022-08-16 | Discharge: 2022-08-16 | Disposition: A | Discharge status: Home / Self Care | Payer: BC Managed Care – PPO | Attending: Nurse Practitioner | Admitting: Nurse Practitioner

## 2022-08-16 DIAGNOSIS — R1032 Left lower quadrant pain: Secondary | ICD-10-CM | POA: Diagnosis not present

## 2022-08-16 LAB — POCT URINALYSIS DIP (MANUAL ENTRY)
Bilirubin, UA: NEGATIVE
Blood, UA: NEGATIVE
Glucose, UA: NEGATIVE mg/dL
Ketones, POC UA: NEGATIVE mg/dL
Leukocytes, UA: NEGATIVE
Nitrite, UA: NEGATIVE
Protein Ur, POC: NEGATIVE mg/dL
Spec Grav, UA: 1.025 (ref 1.010–1.025)
Urobilinogen, UA: 0.2 E.U./dL
pH, UA: 7 (ref 5.0–8.0)

## 2022-08-16 LAB — POCT URINE PREGNANCY: Preg Test, Ur: NEGATIVE

## 2022-08-16 NOTE — ED Provider Notes (Signed)
[facility]    CSN: [id number] Arrival date & time: 08/16/22  1622      History   Chief Complaint No chief complaint on file.   HPI Morgan [name] is a 47 y.o. female presents for groin pain.  Patient reports she woke this morning with some left groin pain.  She states she rubbed the area and thought she felt a bump causing her to come in for evaluation.  Denies any injury to the area, no fevers or chills, no abdominal pain, diarrhea, dysuria, vaginal discharge.  Denies any recent illnesses.  Does report she has felt more rundown recently but thinks that is because of her high blood sugars which she is attempting to be evaluated for.  Denies any strenuous activity.  No history of hernias.  No other concerns at this time.  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   Other secondary hypertension 12/28/2018   Nausea 12/28/2018   Moderate obstructive sleep apnea-hypopnea syndrome 12/28/2018   Chronic migraine w/o aura w/o status migrainosus, not intractable 05/31/2015   Major depressive disorder, recurrent, severe without psychotic features (HCC)    MDD (major depressive disorder), recurrent episode, severe (HCC) 11/30/2014   Ureterolithiasis 04/19/2014   Migraine 03/18/2013   OSA on CPAP 03/18/2013    Past Surgical History:  Procedure Laterality Date   CERVICAL CERCLAGE     all 3 pregnancies   DILATION AND CURETTAGE OF UTERUS  2001   retained placenta   DILATION AND CURETTAGE OF UTERUS  2006   SAB   MOUTH SURGERY  1999   TUBAL LIGATION      OB History     Gravida  7   Para  3   Term  1   Preterm  2   AB  4   Living  1      SAB  2   IAB  2   Ectopic  0   Multiple  0   Live Births  3             Home Medications    Prior to Admission medications   Medication Sig Start Date End Date Taking? Authorizing Provider  baclofen (LIORESAL) 10 MG tablet Take 1 tablet (10 mg total) by mouth 2 (two) times daily as needed for muscle spasms. 06/25/22   Raspet, Noberto Retort, PA-C  Fremanezumab-vfrm (AJOVY) 225 MG/1.5ML SOAJ Inject 225 mg into the skin every 30 (thirty) days. 07/01/22   Anson Fret, MD  Fremanezumab-vfrm 225 MG/1.5ML SOAJ Inject 225 mg into the skin every 30 (thirty) days. 03/30/20   Lomax, Amy, NP  ibuprofen (ADVIL,MOTRIN) 200 MG tablet Take 400-800 mg by mouth every 6 (six) hours as needed for headache.    [provider]  predniSONE (DELTASONE) 20 MG tablet Take 2 tablets daily with breakfast. 06/25/22   Raspet, Erin K, PA-C  Ubrogepant (UBRELVY) 50 MG TABS Take 50 mg by mouth daily as needed. Take one tablet at onset of headache, may repeat 1 tablet in 2 hours, no more than 2 tablets in 24 hours 03/30/20   Shawnie Dapper, NP    Family History  Family History  Problem Relation Age of Onset   HIV Mother    Seizures Mother        secondary to drug abuse   Drug abuse Mother    Heart disease Maternal Grandfather    Diabetes Maternal Grandfather    Cancer Maternal Grandfather    Diabetes Paternal Grandmother    Cancer Paternal Grandfather    Heart disease Maternal Grandmother    Hypertension Maternal Grandmother    Migraines Neg Hx     Social History Social History   Tobacco Use   Smoking status: Every Day    Types: Cigars   Smokeless tobacco: Never  Vaping Use   Vaping Use: Never used  Substance Use Topics   Alcohol use: Yes    Comment: occ   Drug use: Yes    Types: Marijuana     Allergies   Stadol [butorphanol tartrate] and Kale   Review of Systems Review of Systems  Gastrointestinal:        Left groin pain      Physical Exam Triage Vital Signs ED Triage Vitals  Enc Vitals Group     BP 08/16/22 1633 (!) 139/99     Pulse Rate 08/16/22  1633 65     Resp 08/16/22 1633 16     Temp 08/16/22 1633 97.9 F (36.6 C)     Temp Source 08/16/22 1633 Oral     SpO2 08/16/22 1633 97 %     Weight --      Height --      Head Circumference --      Peak Flow --      Pain Score 08/16/22 1636 5     Pain Loc --      Pain Edu? --      Excl. in GC? --    No data found.  Updated Vital Signs BP (!) 139/99 (BP Location: Left Arm)   Pulse 65   Temp 97.9 F (36.6 C) (Oral)   Resp 16   LMP 11/12/2018 (Approximate) Comment: Pt states tube ablation as well  SpO2 97%   Visual Acuity Right Eye Distance:   Left Eye Distance:   Bilateral Distance:    Right Eye Near:   Left Eye Near:    Bilateral Near:     Physical Exam Vitals and nursing note reviewed.  Constitutional:      General: She is not in acute distress.    Appearance: Normal appearance. She is not ill-appearing.  HENT:     Head: Normocephalic and atraumatic.  Eyes:     Pupils: Pupils are equal, round, and reactive to light.  Cardiovascular:     Rate and Rhythm: Normal rate.  Pulmonary:     Effort: Pulmonary effort is normal.  Abdominal:     Hernia: No hernia is present. There is no hernia in the ventral area, left inguinal area or left femoral area.       Comments: Mildly tender to palpation to the left groin.  No hernia.  No lymphadenopathy.  Skin:    General: Skin is warm and dry.  Neurological:     General: No focal deficit present.     Mental Status: She is alert and oriented to person, place, and time.  Psychiatric:        Mood and Affect: Mood normal.        Behavior: Behavior normal.      UC Treatments / Results  Labs (all labs ordered are listed, but  only abnormal results are displayed) Labs Reviewed  POCT URINALYSIS DIP (MANUAL ENTRY)  POCT URINE PREGNANCY    EKG   Radiology No results found.  Procedures Procedures (including critical care time)  Medications Ordered in UC Medications - No data to display  Initial Impression /  Assessment and Plan / UC Course  I have reviewed the triage vital signs and the nursing notes.  Pertinent labs & imaging results that were available during my care of the patient were reviewed by me and considered in my medical decision making (see chart for details).     UA and hCG negative.  Reviewed symptoms and exam with patient.  No signs of infection, lymphadenopathy, or hernia.  Discussed possible strain.  Advised to monitor and treat with over-the-counter analgesics as needed.  Advised PCP follow-up if symptoms do not improve.  Was able to establish patient with a PCP on July 11 Strict ER precautions reviewed and patient verbalized understanding Final Clinical Impressions(s) / UC Diagnoses   Final diagnoses:  Left groin pain     Discharge Instructions      Your exam did not show any signs of a hernia.  Please monitor your symptoms and follow-up with your PCP if they return.  Please go to the ER if you develop any worsening symptoms   ED Prescriptions   None    PDMP not reviewed this encounter.   Radford Pax, NP 08/16/22 1705

## 2022-08-16 NOTE — Discharge Instructions (Signed)
Your exam did not show any signs of a hernia.  Please monitor your symptoms and follow-up with your PCP if they return.  Please go to the ER if you develop any worsening symptoms

## 2022-08-16 NOTE — ED Triage Notes (Signed)
Pt presents to UC w/ c/o left sided groin pain since this morning. Per pt, last sexual encounter 2 months ago. Postmenopausal. Denies vaginal discharge.

## 2022-09-11 ENCOUNTER — Encounter: Payer: Self-pay | Admitting: Family

## 2022-09-12 ENCOUNTER — Encounter: Payer: Self-pay | Admitting: Family

## 2022-09-12 ENCOUNTER — Ambulatory Visit: Payer: BC Managed Care – PPO | Admitting: Family

## 2022-09-12 VITALS — BP 126/78 | HR 64 | Temp 97.3°F | Ht 65.5 in | Wt 212.0 lb

## 2022-09-12 DIAGNOSIS — Z1211 Encounter for screening for malignant neoplasm of colon: Secondary | ICD-10-CM

## 2022-09-12 DIAGNOSIS — Z1159 Encounter for screening for other viral diseases: Secondary | ICD-10-CM

## 2022-09-12 DIAGNOSIS — Z113 Encounter for screening for infections with a predominantly sexual mode of transmission: Secondary | ICD-10-CM

## 2022-09-12 DIAGNOSIS — R739 Hyperglycemia, unspecified: Secondary | ICD-10-CM

## 2022-09-12 DIAGNOSIS — Z91018 Allergy to other foods: Secondary | ICD-10-CM

## 2022-09-12 DIAGNOSIS — Z6834 Body mass index (BMI) 34.0-34.9, adult: Secondary | ICD-10-CM | POA: Insufficient documentation

## 2022-09-12 DIAGNOSIS — G43709 Chronic migraine without aura, not intractable, without status migrainosus: Secondary | ICD-10-CM | POA: Diagnosis not present

## 2022-09-12 DIAGNOSIS — G4733 Obstructive sleep apnea (adult) (pediatric): Secondary | ICD-10-CM | POA: Diagnosis not present

## 2022-09-12 DIAGNOSIS — L989 Disorder of the skin and subcutaneous tissue, unspecified: Secondary | ICD-10-CM

## 2022-09-12 DIAGNOSIS — F325 Major depressive disorder, single episode, in full remission: Secondary | ICD-10-CM

## 2022-09-12 DIAGNOSIS — Z13220 Encounter for screening for lipoid disorders: Secondary | ICD-10-CM

## 2022-09-12 NOTE — Progress Notes (Signed)
Provider: Richarda Blade FNP-C   Morgan [name], Morgan Citrin, NP  Patient Care Team: Hobert Poplaski, Morgan Citrin, NP as PCP - General (Family Medicine) Mitchel Honour, DO as Consulting Physician (Obstetrics and Gynecology)  Extended Emergency Contact Information Primary Emergency Contact: Josph Mach[place] Mobile Phone: [phone] Relation: Relative  Code Status:  Full Code  Goals of care: Advanced Directive information    09/12/2022   10:10 AM  Advanced Directives  Does Patient Have a Medical Advance Directive? No  Would patient like information on creating a medical advance directive? No - Patient declined     Chief Complaint  Patient presents with   Establish Care    New patient to establish care. Pill bottles not present at initial appointment. TD/tdap vaccine documentation is incorrect per patient, she never received. Patient has a GYN- Dr.Morris. Fasting if labs needed. Discuss elevated blood sugar at GYN, needs diabetic screening.     HPI:  Pt is a 47 y.o. female seen today establish care here at [facility] for medical management of chronic diseases.Has not seen PCP for several years.Has been following up with GYN blood sugar was checked and was elevated was advised to establish with PCP.  Asthma - has not had any flare up since childhood.  Migraine with Aura - has nausea,smell and light sensitivity one week prior to migraine.Follows up 6-1 yr with neurology Lomax Amy,NP,states had video visit 07/01/2022  Depression and anxiety - not on medication for a long time since 2018.she was on trazodone for sleep and two other medication.states symptoms well controlled.   Does not do any form of exercise bt works with [employer] does a lot of lifting and walking.   Complains of mole on the forehead - states getting bigger and sore.   No menses for 2 yrs.currently menopausal.Has some good days and bad days with hotflushes.   Sleep Apnea - has CPAP which she  uses some times.   States allergic to kale,collard greens and broccoli breaks out in a stingy rash.    Does not smoke cigarettes.  Drinks alcohol occasionally.  Past Medical History:  Diagnosis Date   Abortion 03/09/2013   Allergy    Anxiety    Asthma    Depression    Headache    Kidney stone    Migraines    Pregnancy induced hypertension    Sleep apnea    Past Surgical History:  Procedure Laterality Date   CERVICAL CERCLAGE     all 3 pregnancies   DILATION AND CURETTAGE OF UTERUS  03/05/1999   retained placenta   DILATION AND CURETTAGE OF UTERUS  03/04/2004   SAB   INDUCED ABORTION  2015   x 2   MOUTH SURGERY  03/04/1997   TUBAL LIGATION      Allergies  Allergen Reactions   Stadol [Butorphanol Tartrate] Itching   Kale Itching and Rash    Allergies as of 09/12/2022       Reactions   Stadol [butorphanol Tartrate] Itching   Kale Itching, Rash        Medication List        Accurate as of September 12, 2022 10:18 AM. If you have any questions, ask your nurse or doctor.          STOP taking these medications    predniSONE 20 MG tablet Commonly known as: DELTASONE Stopped by: Naleyah Ohlinger C Thos Matsumoto       TAKE these medications  Ajovy 225 MG/1.5ML Soaj Generic drug: Fremanezumab-vfrm Inject 225 mg into the skin every 30 (thirty) days.   baclofen 10 MG tablet Commonly known as: LIORESAL Take 1 tablet (10 mg total) by mouth 2 (two) times daily as needed for muscle spasms.   ibuprofen 200 MG tablet Commonly known as: ADVIL Take 400-800 mg by mouth every 6 (six) hours as needed for headache.   ondansetron 4 MG tablet Commonly known as: ZOFRAN Take 4 mg by mouth every 8 (eight) hours as needed for nausea or vomiting.   Ubrelvy 50 MG Tabs Generic drug: Ubrogepant Take 50 mg by mouth daily as needed. Take one tablet at onset of headache, may repeat 1 tablet in 2 hours, no more than 2 tablets in 24 hours        Review of Systems  Constitutional:   Negative for appetite change, chills, fatigue, fever and unexpected weight change.  HENT:  Negative for congestion, dental problem, ear discharge, ear pain, facial swelling, hearing loss, nosebleeds, postnasal drip, rhinorrhea, sinus pressure, sinus pain, sneezing, sore throat, tinnitus and trouble swallowing.   Eyes:  Negative for pain, discharge, redness, itching and visual disturbance.  Respiratory:  Negative for cough, chest tightness, shortness of breath and wheezing.        OSA   Cardiovascular:  Negative for chest pain, palpitations and leg swelling.  Gastrointestinal:  Negative for abdominal distention, abdominal pain, blood in stool, constipation, diarrhea, nausea and vomiting.  Endocrine: Negative for cold intolerance, heat intolerance, polydipsia, polyphagia and polyuria.  Genitourinary:  Negative for difficulty urinating, dysuria, flank pain, frequency and urgency.  Musculoskeletal:  Negative for arthralgias, back pain, gait problem, joint swelling, myalgias, neck pain and neck stiffness.  Skin:  Negative for color change, pallor, rash and wound.  Neurological:  Positive for headaches. Negative for dizziness, syncope, speech difficulty, weakness, light-headedness and numbness.       Chronic migraine   Hematological:  Does not bruise/bleed easily.  Psychiatric/Behavioral:  Negative for agitation, behavioral problems, confusion, hallucinations, self-injury, sleep disturbance and suicidal ideas. The patient is not nervous/anxious.     Immunization History  Administered Date(s) Administered   Tdap 04/16/2018   Pertinent  Health Maintenance Due  Topic Date Due   PAP SMEAR-Modifier  Never done   Colonoscopy  Never done   INFLUENZA VACCINE  10/03/2022      04/16/2018    1:03 AM 05/18/2018    3:46 PM 02/24/2019   11:39 PM 10/22/2021    3:08 PM 09/12/2022   10:11 AM  Fall Risk  Falls in the past year?     0  Was there an injury with Fall?     0  Fall Risk Category Calculator     0   (RETIRED) Patient Fall Risk Level Low fall risk Low fall risk Low fall risk Low fall risk   Patient at Risk for Falls Due to     No Fall Risks  Fall risk Follow up     Falls evaluation completed   Functional Status Survey:    Vitals:   09/12/22 1010  BP: 126/78  Pulse: 64  Temp: (!) 97.3 F (36.3 C)  TempSrc: Temporal  SpO2: 99%  Weight: 212 lb (96.2 kg)  Height: 5' 5.5" (1.664 m)   Body mass index is 34.74 kg/m. Physical Exam Vitals reviewed.  Constitutional:      General: She is not in acute distress.    Appearance: Normal appearance. She is obese. She is not ill-appearing  or diaphoretic.  HENT:     Head: Normocephalic.     Right Ear: Tympanic membrane, ear canal and external ear normal. There is no impacted cerumen.     Left Ear: Tympanic membrane, ear canal and external ear normal. There is no impacted cerumen.     Nose: Nose normal. No congestion or rhinorrhea.     Mouth/Throat:     Mouth: Mucous membranes are moist.     Pharynx: Oropharynx is clear. No oropharyngeal exudate or posterior oropharyngeal erythema.  Eyes:     General: No scleral icterus.       Right eye: No discharge.        Left eye: No discharge.     Extraocular Movements: Extraocular movements intact.     Conjunctiva/sclera: Conjunctivae normal.     Pupils: Pupils are equal, round, and reactive to light.  Neck:     Vascular: No carotid bruit.  Cardiovascular:     Rate and Rhythm: Normal rate and regular rhythm.     Pulses: Normal pulses.     Heart sounds: Normal heart sounds. No murmur heard.    No friction rub. No gallop.  Pulmonary:     Effort: Pulmonary effort is normal. No respiratory distress.     Breath sounds: Normal breath sounds. No wheezing, rhonchi or rales.  Chest:     Chest wall: No tenderness.  Abdominal:     General: Bowel sounds are normal. There is no distension.     Palpations: Abdomen is soft. There is no mass.     Tenderness: There is no abdominal tenderness. There is  no right CVA tenderness, left CVA tenderness, guarding or rebound.  Musculoskeletal:        General: No swelling or tenderness. Normal range of motion.     Cervical back: Normal range of motion. No rigidity or tenderness.     Right lower leg: No edema.     Left lower leg: No edema.  Lymphadenopathy:     Cervical: No cervical adenopathy.  Skin:    General: Skin is warm and dry.     Coloration: Skin is not pale.     Findings: No bruising, erythema, lesion or rash.  Neurological:     Mental Status: She is alert and oriented to person, place, and time.     Cranial Nerves: No cranial nerve deficit.     Sensory: No sensory deficit.     Motor: No weakness.     Coordination: Coordination normal.     Gait: Gait normal.  Psychiatric:        Mood and Affect: Mood normal.        Speech: Speech normal.        Behavior: Behavior normal.        Thought Content: Thought content normal.        Judgment: Judgment normal.    Labs reviewed: No results for input(s): "NA", "K", "CL", "CO2", "GLUCOSE", "BUN", "CREATININE", "CALCIUM", "MG", "PHOS" in the last 8760 hours. No results for input(s): "AST", "ALT", "ALKPHOS", "BILITOT", "PROT", "ALBUMIN" in the last 8760 hours. No results for input(s): "WBC", "NEUTROABS", "HGB", "HCT", "MCV", "PLT" in the last 8760 hours. Lab Results  Component Value Date   TSH 0.52 07/24/2015   No results found for: "HGBA1C" No results found for: "CHOL", "HDL", "LDLCALC", "LDLDIRECT", "TRIG", "CHOLHDL"  Significant Diagnostic Results in last 30 days:  No results found.  Assessment/Plan 1. OSA on CPAP Wears CPAP at times. - COMPLETE METABOLIC PANEL WITH  GFR - CBC with Differential/Platelet  2. Chronic migraine w/o aura w/o status migrainosus, not intractable Chronic  - continue to follow up with Neurologist  - COMPLETE METABOLIC PANEL WITH GFR - CBC with Differential/Platelet  3. Hyperglycemia Lab work high twice during visit with her gynecologist. - Dietary  modification and exercise advised  - Hemoglobin A1c  4. Colon cancer screening Asymptomatic  - Ambulatory referral to Gastroenterology  5. Encounter for hepatitis C screening test for low risk patient Low risk - Hepatitis C antibody  6. Screen for STD (sexually transmitted disease) Reports no high risk behaviors  - HIV Antibody (routine testing w rflx)  7. Screening for hyperlipidemia Dietary modification and exercise advised  - Lipid panel  8. BMI 34.0-34.9,adult BMI 34.74 - Dietary modification and exercise advised as above  - TSH  9. Allergy to other foods Allergic to collard Greens,Kale and Broccoli  - Ambulatory referral to Allergy  10. Skin lesion of face Mid-face raised skin lesion non-tender to touch  - Ambulatory referral to Dermatology    Family/ staff Communication: Reviewed plan of care with patient verbalized understanding   Labs/tests ordered:  - CBC with Differential/Platelet - CMP with eGFR([facility]) - TSH - Hgb A1C - Lipid panel - Hep C Antibody - HIV Antibody (routine testing w rflx)   Next Appointment : Return in about 1 year (around 09/12/2023) for annual Physical examination.   Caesar Bookman, NP

## 2022-09-13 LAB — HEPATITIS C ANTIBODY: Hepatitis C Ab: NONREACTIVE

## 2022-09-13 LAB — COMPLETE METABOLIC PANEL WITH GFR
AG Ratio: 1.6 (calc) (ref 1.0–2.5)
ALT: 25 U/L (ref 6–29)
AST: 12 U/L (ref 10–35)
Albumin: 4.2 g/dL (ref 3.6–5.1)
Alkaline phosphatase (APISO): 78 U/L (ref 31–125)
BUN: 13 mg/dL (ref 7–25)
CO2: 29 mmol/L (ref 20–32)
Calcium: 9.4 mg/dL (ref 8.6–10.2)
Chloride: 105 mmol/L (ref 98–110)
Creat: 0.72 mg/dL (ref 0.50–0.99)
Globulin: 2.7 g/dL (calc) (ref 1.9–3.7)
Glucose, Bld: 86 mg/dL (ref 65–99)
Potassium: 4.2 mmol/L (ref 3.5–5.3)
Sodium: 141 mmol/L (ref 135–146)
Total Bilirubin: 0.7 mg/dL (ref 0.2–1.2)
Total Protein: 6.9 g/dL (ref 6.1–8.1)
eGFR: 105 mL/min/{1.73_m2} (ref >=60)

## 2022-09-13 LAB — HIV ANTIBODY (ROUTINE TESTING W REFLEX): HIV 1&2 Ab, 4th Generation: NONREACTIVE

## 2022-09-13 LAB — CBC WITH DIFFERENTIAL/PLATELET
Absolute Monocytes: 336 cells/uL (ref 200–950)
Basophils Absolute: 41 cells/uL (ref 0–200)
Basophils Relative: 0.7 %
Eosinophils Absolute: 81 cells/uL (ref 15–500)
Eosinophils Relative: 1.4 %
HCT: 41 % (ref 35.0–45.0)
Hemoglobin: 12.9 g/dL (ref 11.7–15.5)
Lymphs Abs: 2569 cells/uL (ref 850–3900)
MCH: 27.3 pg (ref 27.0–33.0)
MCHC: 31.5 g/dL — ABNORMAL LOW (ref 32.0–36.0)
MCV: 86.7 fL (ref 80.0–100.0)
MPV: 10.6 fL (ref 7.5–12.5)
Monocytes Relative: 5.8 %
Neutro Abs: 2772 cells/uL (ref 1500–7800)
Neutrophils Relative %: 47.8 %
Platelets: 270 10*3/uL (ref 140–400)
RBC: 4.73 10*6/uL (ref 3.80–5.10)
RDW: 13.1 % (ref 11.0–15.0)
Total Lymphocyte: 44.3 %
WBC: 5.8 10*3/uL (ref 3.8–10.8)

## 2022-09-13 LAB — HEMOGLOBIN A1C
Hgb A1c MFr Bld: 6 % of total Hgb — ABNORMAL HIGH (ref <5.7)
Mean Plasma Glucose: 126 mg/dL
eAG (mmol/L): 7 mmol/L

## 2022-09-13 LAB — TSH: TSH: 0.93 mIU/L

## 2022-09-24 ENCOUNTER — Encounter: Payer: Self-pay | Admitting: Neurology

## 2022-09-24 NOTE — Telephone Encounter (Signed)
Key: [id number]  this request is approved from 09/24/2022 to 09/24/2023

## 2022-09-24 NOTE — Telephone Encounter (Signed)
Will submit PA

## 2022-09-24 NOTE — Telephone Encounter (Signed)
Additional Information Required  Please advise the dispensing pharmacy to contact the Pharmacy Help Line at [phone] for assistance.

## 2022-10-06 NOTE — Progress Notes (Unsigned)
New Patient Note  RE: Morgan [name] MRN: [id number] DOB: 1975/10/24 Date of Office Visit: 10/07/2022  Consult requested by: Caesar Bookman, NP Primary care provider: Caesar Bookman, NP  Chief Complaint: No chief complaint on file.  History of Present Illness: I had the pleasure of seeing Morgan [name] for initial evaluation at the [facility] on 10/06/2022. She is a 47 y.o. female, who is referred here by Ngetich, Donalee Citrin, NP for the evaluation of food allergies.  She reports food allergy to ***. The reaction occurred at the age of ***, after she ate *** amount of ***. Symptoms started within *** and was in the form of *** hives, swelling, wheezing, abdominal pain, diarrhea, vomiting. ***Denies any associated cofactors such as exertion, infection, NSAID use, or alcohol consumption. The symptoms lasted for ***. She was evaluated in ED and received ***. Since this episode, she does *** not report other accidental exposures to ***. She does *** not have access to epinephrine autoinjector and *** needed to use it.   Past work up includes: ***. Dietary History: patient has been eating other foods including ***milk, ***eggs, ***peanut, ***treenuts, ***sesame, ***shellfish, ***fish, ***soy, ***wheat, ***meats, ***fruits and ***vegetables.  She reports reading labels and avoiding *** in diet completely. She tolerates ***baked egg and baked milk products. \  09/12/2022 PCP visit: "States allergic to kale,collard greens and broccoli breaks out in a stingy rash. "  Assessment and Plan: Morgan [name] is a 47 y.o. female with: No problem-specific Assessment & Plan notes found for this encounter.  No follow-ups on file.  No orders of the defined types were placed in this encounter.  Lab Orders  No laboratory test(s) ordered today    Other allergy screening: Asthma: {Blank single:19197::"yes","no"} Rhino conjunctivitis: {Blank single:19197::"yes","no"} Food allergy:  {Blank single:19197::"yes","no"} Medication allergy: {Blank single:19197::"yes","no"} Hymenoptera allergy: {Blank single:19197::"yes","no"} Urticaria: {Blank single:19197::"yes","no"} Eczema:{Blank single:19197::"yes","no"} History of recurrent infections suggestive of immunodeficency: {Blank single:19197::"yes","no"}  Diagnostics: Spirometry:  Tracings reviewed. Her effort: {Blank single:19197::"Good reproducible efforts.","It was hard to get consistent efforts and there is a question as to whether this reflects a maximal maneuver.","Poor effort, data can not be interpreted."} FVC: ***L FEV1: ***L, ***% predicted FEV1/FVC ratio: ***% Interpretation: {Blank single:19197::"Spirometry consistent with mild obstructive disease","Spirometry consistent with moderate obstructive disease","Spirometry consistent with severe obstructive disease","Spirometry consistent with possible restrictive disease","Spirometry consistent with mixed obstructive and restrictive disease","Spirometry uninterpretable due to technique","Spirometry consistent with normal pattern","No overt abnormalities noted given today's efforts"}.  Please see scanned spirometry results for details.  Skin Testing: {Blank single:19197::"Select foods","Environmental allergy panel","Environmental allergy panel and select foods","Food allergy panel","None","Deferred due to recent antihistamines use"}. *** Results discussed with patient/family.   Past Medical History: Patient Active Problem List   Diagnosis Date Noted  . BMI 34.0-34.9,adult 09/12/2022  . 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 Other secondary hypertension 12/28/2018  . Moderate obstructive sleep apnea-hypopnea syndrome 12/28/2018  . Chronic migraine w/o aura w/o status migrainosus, not intractable 05/31/2015  . Ureterolithiasis 04/19/2014  . Migraine 03/18/2013  . OSA on CPAP 03/18/2013   Past Medical History:  Diagnosis Date  . Abortion 03/09/2013  . Allergy   . Anxiety    . Asthma   . Depression   . Headache   . Kidney stone   . Migraines   . Pregnancy induced hypertension   . Sleep apnea    Past Surgical History: Past Surgical History:  Procedure Laterality Date  . CERVICAL CERCLAGE     all 3 pregnancies  . DILATION  AND CURETTAGE OF UTERUS  03/05/1999   retained placenta  . DILATION AND CURETTAGE OF UTERUS  03/04/2004   SAB  . INDUCED ABORTION  2015   x 2  . MOUTH SURGERY  03/04/1997  . TUBAL LIGATION     Medication List:  Current Outpatient Medications  Medication Sig Dispense Refill  . baclofen (LIORESAL) 10 MG tablet Take 1 tablet (10 mg total) by mouth 2 (two) times daily as needed for muscle spasms. 10 each 0  . Fremanezumab-vfrm (AJOVY) 225 MG/1.5ML SOAJ Inject 225 mg into the skin every 30 (thirty) days. 1.5 mL 11  . ibuprofen (ADVIL,MOTRIN) 200 MG tablet Take 400-800 mg by mouth every 6 (six) hours as needed for headache.    . ondansetron (ZOFRAN) 4 MG tablet Take 4 mg by mouth every 8 (eight) hours as needed for nausea or vomiting.    Marland Kitchen Ubrogepant (UBRELVY) 50 MG TABS Take 50 mg by mouth daily as needed. Take one tablet at onset of headache, may repeat 1 tablet in 2 hours, no more than 2 tablets in 24 hours 10 tablet 11   No current facility-administered medications for this visit.   Allergies: Allergies  Allergen Reactions  . Stadol [Butorphanol Tartrate] Itching  . Broccoli [Brassica Oleracea] Rash  . Collard Greens [Wild Lettuce Extract (Lactuca Virosa)] Rash  . Kale Itching and Rash   Social History: Social History   Socioeconomic History  . Marital status: Divorced    Spouse name: [name]  . Number of children: 1  . Years of education: 29  . Highest education level: Not on file  Occupational History  . Occupation: Development worker, community: [employer]  Tobacco Use  . Smoking status: Never  . Smokeless tobacco: Never  Vaping Use  . Vaping status: Former  . Devices: Tried  Substance and Sexual Activity  . Alcohol  use: Yes    Comment: occ  . Drug use: Yes    Types: Marijuana  . Sexual activity: Not on file  Other Topics Concern  . Not on file  Social History Narrative   Lives with her daughter (born 2007).   Drinks caffeine a few times a week, not daily   Left handed   Social Determinants of Health   Financial Resource Strain: Not on file  Food Insecurity: Not on file  Transportation Needs: Not on file  Physical Activity: Not on file  Stress: Not on file  Social Connections: Not on file   Lives in a ***. Smoking: *** Occupation: ***  Environmental HistorySurveyor, minerals in the house: Copywriter, advertising in the family room: {Blank single:19197::"yes","no"} Carpet in the bedroom: {Blank single:19197::"yes","no"} Heating: {Blank single:19197::"electric","gas","heat pump"} Cooling: {Blank single:19197::"central","window","heat pump"} Pet: {Blank single:19197::"yes ***","no"}  Family History: Family History  Problem Relation Age of Onset  . HIV Mother   . Seizures Mother        secondary to drug abuse  . Drug abuse Mother   . Heart disease Maternal Grandfather   . Diabetes Maternal Grandfather   . Cancer Maternal Grandfather   . Diabetes Paternal Grandmother   . Cancer Paternal Grandfather   . Heart disease Maternal Grandmother   . Hypertension Maternal Grandmother   . Migraines Neg Hx    Problem                               Relation Asthma                                   ***  Eczema                                *** Food allergy                          *** Allergic rhino conjunctivitis     ***  Review of Systems  Constitutional:  Negative for appetite change, chills, fever and unexpected weight change.  HENT:  Negative for congestion and rhinorrhea.   Eyes:  Negative for itching.  Respiratory:  Negative for cough, chest tightness, shortness of breath and wheezing.   Cardiovascular:  Negative for chest pain.  Gastrointestinal:  Negative for  abdominal pain.  Genitourinary:  Negative for difficulty urinating.  Skin:  Negative for rash.  Neurological:  Negative for headaches.   Objective: LMP 11/12/2018 (Approximate) Comment: Pt states tube ablation as well There is no height or weight on file to calculate BMI. Physical Exam Vitals and nursing note reviewed.  Constitutional:      Appearance: Normal appearance. She is well-developed.  HENT:     Head: Normocephalic and atraumatic.     Right Ear: Tympanic membrane and external ear normal.     Left Ear: Tympanic membrane and external ear normal.     Nose: Nose normal.     Mouth/Throat:     Mouth: Mucous membranes are moist.     Pharynx: Oropharynx is clear.  Eyes:     Conjunctiva/sclera: Conjunctivae normal.  Cardiovascular:     Rate and Rhythm: Normal rate and regular rhythm.     Heart sounds: Normal heart sounds. No murmur heard.    No friction rub. No gallop.  Pulmonary:     Effort: Pulmonary effort is normal.     Breath sounds: Normal breath sounds. No wheezing, rhonchi or rales.  Musculoskeletal:     Cervical [name]: Neck supple.  Skin:    General: Skin is warm.     Findings: No rash.  Neurological:     Mental Status: She is alert and oriented to person, place, and time.  Psychiatric:        Behavior: Behavior normal.  The plan was reviewed with the patient/family, and all questions/concerned were addressed.  It was my pleasure to see Morgan [name] today and participate in her care. Please feel free to contact me with any questions or concerns.  Sincerely,  Wyline Mood, DO Allergy & Immunology  [facility] office: [phone] [facility] office: [phone]

## 2022-10-07 ENCOUNTER — Other Ambulatory Visit: Payer: Self-pay

## 2022-10-07 ENCOUNTER — Ambulatory Visit (INDEPENDENT_AMBULATORY_CARE_PROVIDER_SITE_OTHER): Payer: BC Managed Care – PPO | Admitting: Allergy

## 2022-10-07 ENCOUNTER — Encounter: Payer: Self-pay | Admitting: Allergy

## 2022-10-07 VITALS — BP 140/100 | HR 64 | Temp 98.2°F | Resp 162 | Ht 65.25 in | Wt 204.0 lb

## 2022-10-07 DIAGNOSIS — J3089 Other allergic rhinitis: Secondary | ICD-10-CM | POA: Diagnosis not present

## 2022-10-07 DIAGNOSIS — J301 Allergic rhinitis due to pollen: Secondary | ICD-10-CM | POA: Diagnosis not present

## 2022-10-07 DIAGNOSIS — R030 Elevated blood-pressure reading, without diagnosis of hypertension: Secondary | ICD-10-CM

## 2022-10-07 DIAGNOSIS — R21 Rash and other nonspecific skin eruption: Secondary | ICD-10-CM

## 2022-10-07 DIAGNOSIS — T781XXD Other adverse food reactions, not elsewhere classified, subsequent encounter: Secondary | ICD-10-CM | POA: Diagnosis not present

## 2022-10-07 DIAGNOSIS — R7303 Prediabetes: Secondary | ICD-10-CM

## 2022-10-07 NOTE — Patient Instructions (Addendum)
Today's skin testing showed: Positive to weed, trees. Negative to eggs.   Results given.  Food I don't have testing for collards, kale. There is bloodwork for broccoli and mango.  Continue to avoid foods that are bothersome - broccoli, kale, collards, mango, stove top eggs.  For mild symptoms you can take over the counter antihistamines such as Benadryl and monitor symptoms closely. If symptoms worsen or if you have severe symptoms including breathing issues, throat closure, significant swelling, whole body hives, severe diarrhea and vomiting, lightheadedness then seek immediate medical care.  May have oral allergy syndrome. This is caused by cross reactivity of pollen with fresh fruits and vegetables, and nuts. Symptoms are usually localized in the form of itching and burning in mouth and throat. Very rarely it can progress to more severe symptoms. Eating foods in cooked or processed forms usually minimizes symptoms. I recommended avoidance of eating the problem foods, especially during the peak season(s). Sometimes, OFAS can induce severe throat swelling or even a systemic reaction; with such instance, I advised them to report to a local ER. A list of common pollens and food cross-reactivities was provided to the patient.   Allergic rhinitis See below for environmental control measures. Use over the counter antihistamines such as Zyrtec (cetirizine), Claritin (loratadine), Allegra (fexofenadine), or Xyzal (levocetirizine) daily as needed. May take twice a day during allergy flares. May switch antihistamines every few months.  Skin See below for proper skin care.  Use OTC hydrocortisone 1% cream twice a day as needed for mild rash flares - okay to use on the face, neck, groin area. Do not use more than 1 week at a time.  Blood pressure Follow up with PCP regarding this.   Follow up in 8 months or sooner if needed.    Reducing Pollen Exposure Pollen seasons: trees (spring), grass  (summer) and ragweed/weeds (fall). Keep windows closed in your home and car to lower pollen exposure.  Install air conditioning in the bedroom and throughout the house if possible.  Avoid going out in dry windy days - especially early morning. Pollen counts are highest between 5 - 10 AM and on dry, hot and windy days.  Save outside activities for late afternoon or after a heavy rain, when pollen levels are lower.  Avoid mowing of grass if you have grass pollen allergy. Be aware that pollen can also be transported indoors on people and pets.  Dry your clothes in an automatic dryer rather than hanging them outside where they might collect pollen.  Rinse hair and eyes before bedtime.   Skin care recommendations  Bath time: Always use lukewarm water. AVOID very hot or cold water. Keep bathing time to 5-10 minutes. Do NOT use bubble bath. Use a mild soap and use just enough to wash the dirty areas. Do NOT scrub skin vigorously.  After bathing, pat dry your skin with a towel. Do NOT rub or scrub the skin.  Moisturizers and prescriptions:  ALWAYS apply moisturizers immediately after bathing (within 3 minutes). This helps to lock-in moisture. Use the moisturizer several times a day over the whole body. Good summer moisturizers include: Aveeno, CeraVe, Cetaphil. Good winter moisturizers include: Aquaphor, Vaseline, Cerave, Cetaphil, Eucerin, Vanicream. When using moisturizers along with medications, the moisturizer should be applied about one hour after applying the medication to prevent diluting effect of the medication or moisturize around where you applied the medications. When not using medications, the moisturizer can be continued twice daily as maintenance.  Laundry and  clothing: Avoid laundry products with added color or perfumes. Use unscented hypo-allergenic laundry products such as Tide free, Cheer free & gentle, and All free and clear.  If the skin still seems dry or sensitive, you  can try double-rinsing the clothes. Avoid tight or scratchy clothing such as wool. Do not use fabric softeners or dyer sheets.

## 2022-10-09 ENCOUNTER — Encounter: Payer: Self-pay | Admitting: Gastroenterology

## 2022-10-14 ENCOUNTER — Ambulatory Visit
Admission: EM | Admit: 2022-10-14 | Discharge: 2022-10-14 | Disposition: A | Discharge status: Home / Self Care | Payer: BC Managed Care – PPO | Attending: Internal Medicine | Admitting: Internal Medicine

## 2022-10-14 DIAGNOSIS — S39012A Strain of muscle, fascia and tendon of lower back, initial encounter: Secondary | ICD-10-CM | POA: Diagnosis not present

## 2022-10-14 DIAGNOSIS — M5136 Other intervertebral disc degeneration, lumbar region: Secondary | ICD-10-CM

## 2022-10-14 MED ORDER — CYCLOBENZAPRINE HCL 5 MG PO TABS: 5.0000 mg | ORAL_TABLET | Freq: Three times a day (TID) | ORAL | 0 refills | Status: DC | PRN

## 2022-10-14 MED ORDER — METHYLPREDNISOLONE ACETATE 80 MG/ML IJ SUSP
40.0000 mg | Freq: Once | INTRAMUSCULAR | Status: AC
  Administered 2022-10-14: 40 mg via INTRAMUSCULAR

## 2022-10-14 NOTE — ED Provider Notes (Signed)
[facility]  Note:  This document was prepared using Conservation officer, historic buildings and may include unintentional dictation errors.  MRN: [id number] DOB: 1975/03/27  Subjective:   Morgan [name] is a 47 y.o. female presenting for 2-day history of acute on chronic low back pain. The symptoms are chronic in nature but recently has had to do a lot more heavy lifting at work.  She believes this aggravated her back.  No specific trauma, injury.  No weakness, numbness or tingling.  No changes to bowel or urinary habits.  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  No current facility-administered medications for this encounter.  Current Outpatient Medications:    metroNIDAZOLE (FLAGYL) 500 MG tablet, Take 1 tablet twice a day by oral route for 7 days., Disp: , Rfl:    baclofen (LIORESAL) 10 MG tablet, Take 1 tablet (10 mg total) by mouth 2 (two) times daily as needed for muscle spasms., Disp: 10 each, Rfl: 0   Fremanezumab-vfrm (AJOVY) 225 MG/1.5ML SOAJ, Inject 225 mg into the skin every 30 (thirty) days., Disp: 1.5 mL, Rfl: 11   ibuprofen (ADVIL,MOTRIN) 200 MG tablet, Take 400-800 mg by mouth every 6 (six) hours as needed for headache., Disp: , Rfl:    ondansetron (ZOFRAN) 4 MG tablet, Take 4 mg by mouth every 8 (eight) hours as needed for nausea or vomiting., Disp: , Rfl:    Ubrogepant (UBRELVY) 50 MG TABS, Take 50 mg by mouth daily as needed. Take one tablet at onset of headache, may repeat 1 tablet in 2 hours, no more than 2 tablets in 24 hours, Disp: 10 tablet, Rfl: 11   valACYclovir (VALTREX) 1000 MG tablet, TAKE 2 TABLETS BY MOUTH TWICE A DAY FOR 3 DAYS, Disp: , Rfl:    Allergies  Allergen Reactions   Stadol [Butorphanol Tartrate] Itching   Broccoli [Brassica Oleracea] Rash   Collard Greens [Wild Lettuce Extract (Lactuca Virosa)] Rash   Kale Itching and Rash    Past Medical History:  Diagnosis Date   Abortion 03/09/2013   Allergy     Anxiety    Asthma    Depression    Eczema    Headache    Kidney stone    Migraines    Pregnancy induced hypertension    Sleep apnea      Past Surgical History:  Procedure Laterality Date   CERVICAL CERCLAGE     all 3 pregnancies   DILATION AND CURETTAGE OF UTERUS  03/05/1999   retained placenta   DILATION AND CURETTAGE OF UTERUS  03/04/2004   SAB   INDUCED ABORTION  2015   x 2   MOUTH SURGERY  03/04/1997   TUBAL LIGATION      Family History  Problem Relation Age of Onset   HIV Mother    Seizures Mother        secondary to drug abuse   Drug abuse Mother    Heart disease Maternal Grandmother    Hypertension Maternal Grandmother    Heart disease Maternal Grandfather    Diabetes Maternal Grandfather    Cancer Maternal Grandfather    Diabetes Paternal Grandmother    Cancer Paternal Grandfather    Eczema Daughter    Allergic rhinitis Daughter    Migraines Neg Hx     Social History   Tobacco Use   Smoking status: Never    Passive exposure: Past   Smokeless tobacco: Never  Vaping Use   Vaping status: Former  Devices: Tried  Substance Use Topics   Alcohol use: Yes    Comment: occ   Drug use: Yes    Types: Marijuana    ROS   Objective:   Vitals: BP (!) 145/98 (BP Location: Left Arm)   Pulse 70   Temp 98.8 F (37.1 C) (Oral)   Resp 16   LMP 11/12/2018 (Approximate) Comment: Pt states tube ablation as well  SpO2 96%   Physical Exam Constitutional:      General: She is not in acute distress.    Appearance: Normal appearance. She is well-developed. She is not ill-appearing, toxic-appearing or diaphoretic.  HENT:     Head: Normocephalic and atraumatic.     Nose: Nose normal.     Mouth/Throat:     Mouth: Mucous membranes are moist.  Eyes:     General: No scleral icterus.       Right eye: No discharge.        Left eye: No discharge.     Extraocular Movements: Extraocular movements intact.  Cardiovascular:     Rate and Rhythm: Normal rate.   Pulmonary:     Effort: Pulmonary effort is normal.  Musculoskeletal:     Lumbar back: Tenderness present. No swelling, edema, deformity, signs of trauma, lacerations, spasms or bony tenderness. Normal range of motion. Negative right straight leg raise test and negative left straight leg raise test. No scoliosis.       Back:  Skin:    General: Skin is warm and dry.  Neurological:     General: No focal deficit present.     Mental Status: She is alert and oriented to person, place, and time.  Psychiatric:        Mood and Affect: Mood normal.        Behavior: Behavior normal.    IM methylprednisolone acetate 40 mg administered in clinic.  Assessment and Plan :   PDMP not reviewed this encounter.  1. Lumbar strain, initial encounter   2. Degenerative disc disease, lumbar    Suspected lumbar strain secondary to the nature of her work likely worsened by degenerative disc disease.  Recommended follow-up with a spine specialty clinic or her current orthopedist.  Provided with a note for work.  Discussed back care.  Counseled patient on potential for adverse effects with medications prescribed/recommended today, ER and return-to-clinic precautions discussed, patient verbalized understanding.    Wallis Bamberg, [place] 10/15/22 [phone]

## 2022-10-14 NOTE — ED Triage Notes (Signed)
Pt c/o chronic lower back pain-worse x today-no pain meds PTA-denies injury-NAD-slow gait

## 2022-10-16 ENCOUNTER — Encounter: Payer: Self-pay | Admitting: Gastroenterology

## 2022-10-16 ENCOUNTER — Telehealth: Payer: Self-pay | Admitting: *Deleted

## 2022-10-16 NOTE — Telephone Encounter (Signed)
Attempt to reach pt for pre-visit. LM with call back #.  Will attempt to reach again in 5 min due to no other # listed in profile 2nd attempt unsuccessful LM with # to call and instructions to call by end of day and reschedule pre-visit or procedure will be canceled.

## 2022-11-06 ENCOUNTER — Encounter: Payer: BC Managed Care – PPO | Admitting: Gastroenterology

## 2022-11-18 ENCOUNTER — Ambulatory Visit: Payer: BC Managed Care – PPO

## 2022-11-18 VITALS — Ht 65.25 in | Wt 208.0 lb

## 2022-11-18 DIAGNOSIS — Z1211 Encounter for screening for malignant neoplasm of colon: Secondary | ICD-10-CM

## 2022-11-18 MED ORDER — NA SULFATE-K SULFATE-MG SULF 17.5-3.13-1.6 GM/177ML PO SOLN: 1.0000 | Freq: Once | ORAL | 0 refills | Status: AC

## 2022-11-18 NOTE — Progress Notes (Signed)
No egg or soy allergy known to patient  No issues known to pt with past sedation with any surgeries or procedures Patient denies ever being told they had issues or difficulty with intubation  No FH of Malignant Hyperthermia Pt is not on diet pills Pt is not on  home 02  Pt is not on blood thinners  Pt reports intermit constipation  No A fib or A flutter Have any cardiac testing pending--no  LOA: independent  Prep: suprep   Patient's chart reviewed by Cathlyn Parsons CNRA prior to previsit and patient appropriate for the [facility].  Previsit completed and red dot placed by patient's name on their procedure day (on provider's schedule).     PV competed with patient. Prep instructions sent via mychart and home address. Goodrx coupon for [facility] provided to use for price reduction if needed.

## 2022-12-03 ENCOUNTER — Encounter: Payer: Self-pay | Admitting: Gastroenterology

## 2022-12-11 ENCOUNTER — Encounter: Payer: Self-pay | Admitting: Gastroenterology

## 2022-12-11 ENCOUNTER — Ambulatory Visit: Payer: BC Managed Care – PPO | Admitting: Gastroenterology

## 2022-12-11 VITALS — BP 124/85 | HR 61 | Temp 98.4°F | Resp 18 | Ht 65.25 in | Wt 208.0 lb

## 2022-12-11 DIAGNOSIS — D125 Benign neoplasm of sigmoid colon: Secondary | ICD-10-CM

## 2022-12-11 DIAGNOSIS — K635 Polyp of colon: Secondary | ICD-10-CM | POA: Diagnosis not present

## 2022-12-11 DIAGNOSIS — D123 Benign neoplasm of transverse colon: Secondary | ICD-10-CM

## 2022-12-11 DIAGNOSIS — Z1211 Encounter for screening for malignant neoplasm of colon: Secondary | ICD-10-CM

## 2022-12-11 DIAGNOSIS — D120 Benign neoplasm of cecum: Secondary | ICD-10-CM

## 2022-12-11 MED ORDER — SODIUM CHLORIDE 0.9 % IV SOLN: 500.0000 mL | Freq: Once | INTRAVENOUS | Status: DC

## 2022-12-11 NOTE — Progress Notes (Unsigned)
Called to room to assist during endoscopic procedure.  Patient ID and intended procedure confirmed with present staff. Received instructions for my participation in the procedure from the performing physician.  

## 2022-12-11 NOTE — Progress Notes (Unsigned)
Report to PACU, RN, vss, BBS= Clear.  

## 2022-12-11 NOTE — Patient Instructions (Addendum)
Resume previous diet.                           - Continue present medications.                           - Await pathology results.                           - Repeat colonoscopy in 3 - 5 years for                            surveillance.  Handout on polyps and diverticulosis given.     YOU HAD AN ENDOSCOPIC PROCEDURE TODAY AT THE [facility]:   Refer to the procedure report that was given to you for any specific questions about what was found during the examination.  If the procedure report does not answer your questions, please call your gastroenterologist to clarify.  If you requested that your care partner not be given the details of your procedure findings, then the procedure report has been included in a sealed envelope for you to review at your convenience later.  YOU SHOULD EXPECT: Some feelings of bloating in the abdomen. Passage of more gas than usual.  Walking can help get rid of the air that was put into your GI tract during the procedure and reduce the bloating. If you had a lower endoscopy (such as a colonoscopy or flexible sigmoidoscopy) you may notice spotting of blood in your stool or on the toilet paper. If you underwent a bowel prep for your procedure, you may not have a normal bowel movement for a few days.  Please Note:  You might notice some irritation and congestion in your nose or some drainage.  This is from the oxygen used during your procedure.  There is no need for concern and it should clear up in a day or so.  SYMPTOMS TO REPORT IMMEDIATELY:  Following lower endoscopy (colonoscopy or flexible sigmoidoscopy):  Excessive amounts of blood in the stool  Significant tenderness or worsening of abdominal pains  Swelling of the abdomen that is new, acute  Fever of 100F or higher   For urgent or emergent issues, a gastroenterologist can be reached at any hour by calling [phone]. Do not use MyChart messaging for urgent concerns.    DIET:  We do  recommend a small meal at first, but then you may proceed to your regular diet.  Drink plenty of fluids but you should avoid alcoholic beverages for 24 hours.  ACTIVITY:  You should plan to take it easy for the rest of today and you should NOT DRIVE or use heavy machinery until tomorrow (because of the sedation medicines used during the test).    FOLLOW UP: Our staff will call the number listed on your records the next business day following your procedure.  We will call around 7:15- 8:00 am to check on you and address any questions or concerns that you may have regarding the information given to you following your procedure. If we do not reach you, we will leave a message.     If any biopsies were taken you will be contacted by phone or by letter within the next 1-3 weeks.  Please call us at [phone] if you have not heard about the biopsies  in 3 weeks.    SIGNATURES/CONFIDENTIALITY: You and/or your care partner have signed paperwork which will be entered into your electronic medical record.  These signatures attest to the fact that that the information above on your After Visit Summary has been reviewed and is understood.  Full responsibility of the confidentiality of this discharge information lies with you and/or your care-partner.

## 2022-12-11 NOTE — Op Note (Signed)
[facility] Patient Name: Morgan [name] Procedure Date: 12/11/2022 1:25 PM MRN: [id number] Endoscopist: Napoleon Form , MD, [id number] Age: 47 Referring MD:  Date of Birth: 1975/08/30 Gender: Female Account #: [id number] Procedure:                Colonoscopy Indications:              Screening for colorectal malignant neoplasm Medicines:                Monitored Anesthesia Care Procedure:                Pre-Anesthesia Assessment:                           - Prior to the procedure, a History and Physical                            was performed, and patient medications and                            allergies were reviewed. The patient's tolerance of                            previous anesthesia was also reviewed. The risks                            and benefits of the procedure and the sedation                            options and risks were discussed with the patient.                            All questions were answered, and informed consent                            was obtained. Prior Anticoagulants: The patient has                            taken no anticoagulant or antiplatelet agents. ASA                            Grade Assessment: II - A patient with mild systemic                            disease. After reviewing the risks and benefits,                            the patient was deemed in satisfactory condition to                            undergo the procedure.                           After obtaining informed consent, the colonoscope  was passed under direct vision. Throughout the                            procedure, the patient's blood pressure, pulse, and                            oxygen saturations were monitored continuously. The                            [id number]-HQ190L Colonoscope [id number] was introduced                            through the anus and advanced to the the cecum,                            identified  by appendiceal orifice and ileocecal                            valve. The colonoscopy was performed without                            difficulty. The patient tolerated the procedure                            well. The quality of the bowel preparation was                            good. The ileocecal valve, appendiceal orifice, and                            rectum were photographed. Scope In: 1:28:51 PM Scope Out: 1:47:54 PM Scope Withdrawal Time: 0 hours 16 minutes 19 seconds  Total Procedure Duration: 0 hours 19 minutes 3 seconds  Findings:                 The perianal and digital rectal examinations were                            normal.                           Seven sessile polyps were found in the sigmoid                            colon, transverse colon and cecum. The polyps were                            4 to 6 mm in size. These polyps were removed with a                            cold snare. Resection and retrieval were complete.                           A few small-mouthed diverticula were found in the  sigmoid colon.                           Non-bleeding external and internal hemorrhoids were                            found during retroflexion. The hemorrhoids were                            medium-sized. Complications:            No immediate complications. Estimated Blood Loss:     Estimated blood loss was minimal. Impression:               - Seven 4 to 6 mm polyps in the sigmoid colon, in                            the transverse colon and in the cecum, removed with                            a cold snare. Resected and retrieved.                           - Diverticulosis in the sigmoid colon.                           - Non-bleeding external and internal hemorrhoids. Recommendation:           - Patient has a contact number available for                            emergencies. The signs and symptoms of potential                             delayed complications were discussed with the                            patient. Return to normal activities tomorrow.                            Written discharge instructions were provided to the                            patient.                           - Resume previous diet.                           - Continue present medications.                           - Await pathology results.                           - Repeat colonoscopy in 3 - 5 years for  surveillance. Napoleon Form, MD 12/11/2022 1:56:53 PM This report has been signed electronically.

## 2022-12-11 NOTE — Progress Notes (Unsigned)
Gastroenterology History and Physical   Primary Care Physician:  Ngetich, Donalee Citrin, NP   Reason for Procedure:  Colorectal cancer screening  Plan:    Screening colonoscopy with possible interventions as needed     HPI: Morgan [name] is a very pleasant 47 y.o. female here for screening colonoscopy. Denies any nausea, vomiting, abdominal pain, melena or bright red blood per rectum  The risks and benefits as well as alternatives of endoscopic procedure(s) have been discussed and reviewed. All questions answered. The patient agrees to proceed.    Past Medical History:  Diagnosis Date   Abortion 03/09/2013   Allergy    Anxiety    Asthma    Depression    Eczema    Headache    Kidney stone    Migraines    Pregnancy induced hypertension    Sleep apnea     Past Surgical History:  Procedure Laterality Date   CERVICAL CERCLAGE     all 3 pregnancies   DILATION AND CURETTAGE OF UTERUS  03/05/1999   retained placenta   DILATION AND CURETTAGE OF UTERUS  03/04/2004   SAB   INDUCED ABORTION  2015   x 2   MOUTH SURGERY  03/04/1997   TUBAL LIGATION      Prior to Admission medications   Medication Sig Start Date End Date Taking? Authorizing Provider  baclofen (LIORESAL) 10 MG tablet Take 1 tablet (10 mg total) by mouth 2 (two) times daily as needed for muscle spasms. Patient not taking: Reported on 11/18/2022 06/25/22   Raspet, Noberto Retort, PA-C  cyclobenzaprine (FLEXERIL) 5 MG tablet Take 1 tablet (5 mg total) by mouth 3 (three) times daily as needed for muscle spasms. Patient not taking: Reported on 11/18/2022 10/14/22   Wallis Bamberg, PA-C  Fremanezumab-vfrm (AJOVY) 225 MG/1.5ML SOAJ Inject 225 mg into the skin every 30 (thirty) days. 07/01/22   Anson Fret, MD  ibuprofen (ADVIL,MOTRIN) 200 MG tablet Take 400-800 mg by mouth every 6 (six) hours as needed for headache.    [provider]  meloxicam (MOBIC) 15 MG tablet Take 15 mg by mouth daily.    [provider]  ondansetron (ZOFRAN) 4 MG tablet Take 4 mg by mouth every 8 (eight) hours as needed for nausea or vomiting.    [provider]  Ubrogepant (UBRELVY) 50 MG TABS Take 50 mg by mouth daily as needed. Take one tablet at onset of headache, may repeat 1 tablet in 2 hours, no more than 2 tablets in 24 hours 03/30/20   Lomax, Amy, NP  valACYclovir (VALTREX) 1000 MG tablet TAKE 2 TABLETS BY MOUTH TWICE A DAY FOR 3 DAYS    [provider]    Current Outpatient Medications  Medication Sig Dispense Refill   baclofen (LIORESAL) 10 MG tablet Take 1 tablet (10 mg total) by mouth 2 (two) times daily as needed for muscle spasms. (Patient not taking: Reported on 11/18/2022) 10 each 0   cyclobenzaprine (FLEXERIL) 5 MG tablet Take 1 tablet (5 mg total) by mouth 3 (three) times daily as needed for muscle spasms. (Patient not taking: Reported on 11/18/2022) 30 tablet 0   Fremanezumab-vfrm (AJOVY) 225 MG/1.5ML SOAJ Inject 225 mg into the skin every 30 (thirty) days. 1.5 mL 11   ibuprofen (ADVIL,MOTRIN) 200 MG tablet Take 400-800 mg by mouth every 6 (six) hours as needed for headache.     meloxicam (MOBIC) 15 MG tablet Take 15 mg by mouth daily.  ondansetron (ZOFRAN) 4 MG tablet Take 4 mg by mouth every 8 (eight) hours as needed for nausea or vomiting.     Ubrogepant (UBRELVY) 50 MG TABS Take 50 mg by mouth daily as needed. Take one tablet at onset of headache, may repeat 1 tablet in 2 hours, no more than 2 tablets in 24 hours 10 tablet 11   valACYclovir (VALTREX) 1000 MG tablet TAKE 2 TABLETS BY MOUTH TWICE A DAY FOR 3 DAYS     Current Facility-Administered Medications  Medication Dose Route Frequency Provider Last Rate Last Admin   0.9 %  sodium chloride infusion  500 mL Intravenous Once Napoleon Form, MD        Allergies as of 12/11/2022 - Review Complete 12/11/2022  Allergen Reaction Noted   Stadol [butorphanol tartrate] Itching 07/03/2011   Broccoli [brassica oleracea]  Rash 09/12/2022   Collard greens [wild lettuce extract (lactuca virosa)] Rash 09/12/2022   Kale Itching and Rash 12/14/2012    Family History  Problem Relation Age of Onset   HIV Mother    Seizures Mother        secondary to drug abuse   Drug abuse Mother    Heart disease Maternal Grandmother    Hypertension Maternal Grandmother    Heart disease Maternal Grandfather    Diabetes Maternal Grandfather    Cancer Maternal Grandfather    Diabetes Paternal Grandmother    Cancer Paternal Grandfather    Eczema Daughter    Allergic rhinitis Daughter    Migraines Neg Hx    Colon cancer Neg Hx    Rectal cancer Neg Hx    Stomach cancer Neg Hx    Colon polyps Neg Hx    Esophageal cancer Neg Hx     Social History   Socioeconomic History   Marital status: Divorced    Spouse name: [name]   Number of children: 1   Years of education: 12   Highest education level: Not on file  Occupational History   Occupation: Development worker, community: [employer]  Tobacco Use   Smoking status: Never    Passive exposure: Past   Smokeless tobacco: Never  Vaping Use   Vaping status: Former   Devices: Tried  Substance and Sexual Activity   Alcohol use: Yes    Comment: occ   Drug use: Yes    Types: Marijuana   Sexual activity: Not on file  Other Topics Concern   Not on file  Social History Narrative   Lives with her daughter (born 2007).   Drinks caffeine a few times a week, not daily   Left handed   Social Determinants of Health   Financial Resource Strain: Not on file  Food Insecurity: Not on file  Transportation Needs: Not on file  Physical Activity: Not on file  Stress: Not on file  Social Connections: Not on file  Intimate Partner Violence: Not on file    Review of Systems:  All other review of systems negative except as mentioned in the HPI.  Physical Exam: Vital signs in last 24 hours: BP (!) 149/105   Pulse 69   Temp 98.4 F (36.9 C)   Ht 5' 5.25" (1.657 m)   Wt 208 lb  (94.3 kg)   LMP 11/12/2018 (Approximate) Comment: Pt states tube ablation as well  SpO2 98%   BMI 34.35 kg/m  General:   Alert, NAD Lungs:  Clear .   Heart:  Regular rate and rhythm Abdomen:  Soft,  nontender and nondistended. Neuro/Psych:  Alert and cooperative. Normal mood and affect. A and O x 3  Reviewed labs, radiology imaging, old records and pertinent past GI work up  Patient is appropriate for planned procedure(s) and anesthesia in an ambulatory setting   K. Scherry Ran , MD [phone]

## 2022-12-12 ENCOUNTER — Telehealth: Payer: Self-pay | Admitting: *Deleted

## 2022-12-12 NOTE — Telephone Encounter (Signed)
Post procedure follow up phone call. No answer at number given.  Left message on voicemail.  

## 2022-12-16 LAB — SURGICAL PATHOLOGY

## 2023-01-02 ENCOUNTER — Encounter: Payer: Self-pay | Admitting: Gastroenterology

## 2023-01-14 ENCOUNTER — Ambulatory Visit
Admission: EM | Admit: 2023-01-14 | Discharge: 2023-01-14 | Disposition: A | Discharge status: Home / Self Care | Payer: BC Managed Care – PPO | Attending: Internal Medicine | Admitting: Internal Medicine

## 2023-01-14 DIAGNOSIS — N898 Other specified noninflammatory disorders of vagina: Secondary | ICD-10-CM | POA: Diagnosis not present

## 2023-01-14 DIAGNOSIS — R030 Elevated blood-pressure reading, without diagnosis of hypertension: Secondary | ICD-10-CM | POA: Insufficient documentation

## 2023-01-14 DIAGNOSIS — N3001 Acute cystitis with hematuria: Secondary | ICD-10-CM | POA: Diagnosis not present

## 2023-01-14 LAB — POCT URINALYSIS DIP (MANUAL ENTRY)
Bilirubin, UA: NEGATIVE
Glucose, UA: NEGATIVE mg/dL
Ketones, POC UA: NEGATIVE mg/dL
Nitrite, UA: NEGATIVE
Protein Ur, POC: NEGATIVE mg/dL
Spec Grav, UA: 1.025 (ref 1.010–1.025)
Urobilinogen, UA: 0.2 U/dL
pH, UA: 7 (ref 5.0–8.0)

## 2023-01-14 LAB — POCT URINE PREGNANCY: Preg Test, Ur: NEGATIVE

## 2023-01-14 MED ORDER — CEPHALEXIN 500 MG PO CAPS
500.0000 mg | ORAL_CAPSULE | Freq: Two times a day (BID) | ORAL | 0 refills | Status: AC
Start: 2023-01-14 — End: 2023-01-21

## 2023-01-14 MED ORDER — METRONIDAZOLE 500 MG PO TABS
500.0000 mg | ORAL_TABLET | Freq: Two times a day (BID) | ORAL | 0 refills | Status: DC
Start: 2023-01-14 — End: 2023-06-23

## 2023-01-14 NOTE — ED Triage Notes (Signed)
Pt presents for UTI testing and c/o possible BV.   Pt states she has foul odor in urine and frequency. States she has vaginal discharge and lower abd tenderness.

## 2023-01-14 NOTE — ED Provider Notes (Signed)
[facility]    CSN: [id number] Arrival date & time: 01/14/23  1628      History   Chief Complaint Chief Complaint  Patient presents with   Vaginal Itching    HPI Morgan [name] is a 47 y.o. female presents for dysuria and vaginal discharge.  Patient reports a couple weeks of urinary burning, urgency, frequency as well as a malodorous vaginal discharge.  Denies any fevers, nausea/vomiting, flank pain.  No STD exposure or concern but would like screening.  Reports a history of BV and states the symptoms are consistent with that.  No OTC medications have been used since onset.  No other concerns at this time.   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   Other secondary hypertension 12/28/2018   Moderate obstructive sleep apnea-hypopnea syndrome 12/28/2018   Chronic migraine w/o aura w/o status migrainosus, not intractable 05/31/2015   Ureterolithiasis 04/19/2014   Migraine 03/18/2013   OSA on CPAP 03/18/2013    Past Surgical History:  Procedure Laterality Date   CERVICAL CERCLAGE     all 3 pregnancies   DILATION AND CURETTAGE OF UTERUS  03/05/1999   retained placenta   DILATION AND CURETTAGE OF UTERUS  03/04/2004   SAB   INDUCED ABORTION  2015   x 2   MOUTH SURGERY  03/04/1997   TUBAL LIGATION      OB History     Gravida  7   Para  3   Term  1   Preterm  2   AB  4   Living  1      SAB  2   IAB  2   Ectopic  0   Multiple  0   Live Births  3            Home Medications    Prior to Admission medications   Medication Sig Start Date End Date Taking? Authorizing Provider  cephALEXin (KEFLEX) 500 MG capsule Take 1  capsule (500 mg total) by mouth 2 (two) times daily for 7 days. 01/14/23 01/21/23 Yes Radford Pax, NP  metroNIDAZOLE (FLAGYL) 500 MG tablet Take 1 tablet (500 mg total) by mouth 2 (two) times daily. 01/14/23  Yes Radford Pax, NP  baclofen (LIORESAL) 10 MG tablet Take 1 tablet (10 mg total) by mouth 2 (two) times daily as needed for muscle spasms. Patient not taking: Reported on 11/18/2022 06/25/22   Raspet, Noberto Retort, PA-C  cyclobenzaprine (FLEXERIL) 5 MG tablet Take 1 tablet (5 mg total) by mouth 3 (three) times daily as needed for muscle spasms. Patient not taking: Reported on 11/18/2022 10/14/22   Wallis Bamberg, PA-C  Fremanezumab-vfrm (AJOVY) 225 MG/1.5ML SOAJ Inject 225 mg into the skin every 30 (thirty) days. 07/01/22   Anson Fret, MD  ibuprofen (ADVIL,MOTRIN) 200 MG tablet Take 400-800 mg by mouth every 6 (six) hours as needed for headache.    [provider]  meloxicam (MOBIC) 15 MG tablet Take 15 mg by mouth daily.    [provider]  ondansetron (ZOFRAN) 4 MG  tablet Take 4 mg by mouth every 8 (eight) hours as needed for nausea or vomiting.    [provider]  Ubrogepant (UBRELVY) 50 MG TABS Take 50 mg by mouth daily as needed. Take one tablet at onset of headache, may repeat 1 tablet in 2 hours, no more than 2 tablets in 24 hours 03/30/20   Lomax, Amy, NP  valACYclovir (VALTREX) 1000 MG tablet TAKE 2 TABLETS BY MOUTH TWICE A DAY FOR 3 DAYS    [provider]    Family History Family History  Problem Relation Age of Onset   HIV Mother    Seizures Mother        secondary to drug abuse   Drug abuse Mother    Heart disease Maternal Grandmother    Hypertension Maternal Grandmother    Heart disease Maternal Grandfather    Diabetes Maternal Grandfather    Cancer Maternal Grandfather    Diabetes Paternal Grandmother    Cancer Paternal Grandfather    Eczema Daughter    Allergic rhinitis Daughter    Migraines Neg Hx    Colon cancer Neg Hx    Rectal  cancer Neg Hx    Stomach cancer Neg Hx    Colon polyps Neg Hx    Esophageal cancer Neg Hx     Social History Social History   Tobacco Use   Smoking status: Never    Passive exposure: Past   Smokeless tobacco: Never  Vaping Use   Vaping status: Former   Devices: Tried  Substance Use Topics   Alcohol use: Yes    Comment: occ   Drug use: Yes    Types: Marijuana     Allergies   Stadol [butorphanol tartrate], Broccoli [brassica oleracea], Collard greens [wild lettuce extract (lactuca virosa)], and Kale   Review of Systems Review of Systems  Genitourinary:  Positive for dysuria and vaginal discharge.     Physical Exam Triage Vital Signs ED Triage Vitals  Encounter Vitals Group     BP 01/14/23 1723 (!) 159/116     Systolic BP Percentile --      Diastolic BP Percentile --      Pulse Rate 01/14/23 1723 68     Resp 01/14/23 1723 17     Temp 01/14/23 1723 98.5 F (36.9 C)     Temp Source 01/14/23 1723 Oral     SpO2 01/14/23 1723 97 %     Weight --      Height --      Head Circumference --      Peak Flow --      Pain Score 01/14/23 1722 5     Pain Loc --      Pain Education --      Exclude from Growth Chart --    No data found.  Updated Vital Signs BP (!) 140/100 (BP Location: Left Arm)   Pulse 68   Temp 98.5 F (36.9 C) (Oral)   Resp 17   LMP 11/12/2018 (Approximate) Comment: Pt states tube ablation as well  SpO2 97%   Visual Acuity Right Eye Distance:   Left Eye Distance:   Bilateral Distance:    Right Eye Near:   Left Eye Near:    Bilateral Near:     Physical Exam Vitals and nursing note reviewed.  Constitutional:      Appearance: Normal appearance.  HENT:     Head: Normocephalic and atraumatic.  Eyes:     Pupils: Pupils are equal, round,  and reactive to light.  Cardiovascular:     Rate and Rhythm: Normal rate.  Pulmonary:     Effort: Pulmonary effort is normal.  Abdominal:     Tenderness: There is no right CVA tenderness or left CVA  tenderness.  Skin:    General: Skin is warm and dry.  Neurological:     General: No focal deficit present.     Mental Status: She is alert and oriented to person, place, and time.  Psychiatric:        Mood and Affect: Mood normal.        Behavior: Behavior normal.      UC Treatments / Results  Labs (all labs ordered are listed, but only abnormal results are displayed) Labs Reviewed  POCT URINALYSIS DIP (MANUAL ENTRY) - Abnormal; Notable for the following components:      Result Value   Blood, UA trace-intact (*)    Leukocytes, UA Trace (*)    All other components within normal limits  URINE CULTURE  POCT URINE PREGNANCY  CERVICOVAGINAL ANCILLARY ONLY    EKG   Radiology No results found.  Procedures Procedures (including critical care time)  Medications Ordered in UC Medications - No data to display  Initial Impression / Assessment and Plan / UC Course  I have reviewed the triage vital signs and the nursing notes.  Pertinent labs & imaging results that were available during my care of the patient were reviewed by me and considered in my medical decision making (see chart for details).     Reviewed exam and symptoms with patient.  No red flags.  UA positive for UTI will culture and start Keflex.  Vaginal swab as ordered and will contact for any positive results.  As patient reports symptoms consistent with previous BV infections will start Flagyl.  PCP or GYN follow-up if symptoms do not improve.  Blood pressure was elevated on intake, did improve with recheck.  No history of hypertension.  Advised to keep a log and take to PCP.  No headache, chest pain, shortness of breath.  ER precautions reviewed. Final Clinical Impressions(s) / UC Diagnoses   Final diagnoses:  Acute cystitis with hematuria  Vaginal discharge  Elevated BP without diagnosis of hypertension     Discharge Instructions      The clinic will contact you with results of the urine culture and  vaginal swab done today if positive.  Start Keflex twice daily for 7 days to treat a UTI and start Flagyl twice daily for 7 days for BV infection.  Lots of rest and fluids.  Please follow-up with your PCP or gynecologist if symptoms do not improve.  Please go to the ER for any worsening symptoms.  Hope you feel better soon!    ED Prescriptions     Medication Sig Dispense Auth. Provider   cephALEXin (KEFLEX) 500 MG capsule Take 1 capsule (500 mg total) by mouth 2 (two) times daily for 7 days. 14 capsule Radford Pax, NP   metroNIDAZOLE (FLAGYL) 500 MG tablet Take 1 tablet (500 mg total) by mouth 2 (two) times daily. 14 tablet Radford Pax, NP      PDMP not reviewed this encounter.   Radford Pax, NP 01/14/23 [phone]

## 2023-01-14 NOTE — Discharge Instructions (Signed)
The clinic will contact you with results of the urine culture and vaginal swab done today if positive.  Start Keflex twice daily for 7 days to treat a UTI and start Flagyl twice daily for 7 days for BV infection.  Lots of rest and fluids.  Please follow-up with your PCP or gynecologist if symptoms do not improve.  Please go to the ER for any worsening symptoms.  Hope you feel better soon!

## 2023-01-15 ENCOUNTER — Telehealth: Payer: Self-pay

## 2023-01-15 LAB — CERVICOVAGINAL ANCILLARY ONLY
Bacterial Vaginitis (gardnerella): POSITIVE — AB
Candida Glabrata: NEGATIVE
Candida Vaginitis: POSITIVE — AB
Chlamydia: NEGATIVE
Comment: NEGATIVE
Comment: NEGATIVE
Comment: NEGATIVE
Comment: NEGATIVE
Comment: NEGATIVE
Comment: NORMAL
Neisseria Gonorrhea: NEGATIVE
Trichomonas: NEGATIVE

## 2023-01-15 MED ORDER — FLUCONAZOLE 150 MG PO TABS: 150.0000 mg | ORAL_TABLET | Freq: Once | ORAL | 0 refills | Status: AC

## 2023-01-15 NOTE — Telephone Encounter (Signed)
Per protocol, pt requires tx with Diflucan.  Pt went home on metronidazole.  Rx sent to pharmacy on file.

## 2023-01-17 LAB — URINE CULTURE: Culture: 80000 — AB

## 2023-03-14 ENCOUNTER — Ambulatory Visit
Admission: EM | Admit: 2023-03-14 | Discharge: 2023-03-14 | Disposition: A | Discharge status: Home / Self Care | Payer: BC Managed Care – PPO | Attending: Physician Assistant | Admitting: Physician Assistant

## 2023-03-14 DIAGNOSIS — H00015 Hordeolum externum left lower eyelid: Secondary | ICD-10-CM

## 2023-03-14 MED ORDER — ERYTHROMYCIN 5 MG/GM OP OINT
TOPICAL_OINTMENT | OPHTHALMIC | 0 refills | Status: DC
Start: 2023-03-14 — End: 2023-06-23

## 2023-03-14 NOTE — ED Triage Notes (Signed)
 Pt presents with c/o redness and itching on the corner of her lt eye since this morning. States she has white drainage coming out her eye lid.

## 2023-03-14 NOTE — Discharge Instructions (Signed)
 Use warm compress on eye 10 - 15 minutes 4 times per day

## 2023-03-14 NOTE — ED Provider Notes (Signed)
 [facility]    CSN: [id number] Arrival date & time: 03/14/23  1406      History   Chief Complaint Chief Complaint  Patient presents with   Eye Problem    HPI Carigan Lister is a 48 y.o. female.   Patient here concerned with L eye discharge x a few hours ago.  Denies pain, tenderness, swelling, vision loss, foreign body sensation, painful EOM, f/c, URI sx.  No advil  or tylenol  today.      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   Other secondary hypertension 12/28/2018   Moderate obstructive sleep apnea-hypopnea syndrome 12/28/2018   Chronic migraine w/o aura w/o status migrainosus, not intractable 05/31/2015   Ureterolithiasis 04/19/2014   Migraine 03/18/2013   OSA on CPAP 03/18/2013    Past Surgical History:  Procedure Laterality Date   CERVICAL CERCLAGE     all 3 pregnancies   DILATION AND CURETTAGE OF UTERUS  03/05/1999   retained placenta   DILATION AND CURETTAGE OF UTERUS  03/04/2004   SAB   INDUCED ABORTION  2015   x 2   MOUTH SURGERY  03/04/1997   TUBAL LIGATION      OB History     Gravida  7   Para  3   Term  1   Preterm  2   AB  4   Living  1      SAB  2   IAB  2   Ectopic  0   Multiple  0   Live Births  3            Home Medications    Prior to Admission medications   Medication Sig Start Date End Date Taking? Authorizing Provider  erythromycin  ophthalmic ointment Place a 1/2 inch ribbon of ointment into the lower eyelid. 03/14/23  Yes Juleen Rush, PA-C  baclofen  (LIORESAL ) 10 MG tablet Take 1 tablet (10 mg total) by mouth 2 (two) times daily as needed for muscle spasms. Patient not taking: Reported on 11/18/2022  06/25/22   Raspet, Erin K, PA-C  cyclobenzaprine  (FLEXERIL ) 5 MG tablet Take 1 tablet (5 mg total) by mouth 3 (three) times daily as needed for muscle spasms. Patient not taking: Reported on 11/18/2022 10/14/22   Christopher Savannah, PA-C  Fremanezumab -vfrm (AJOVY ) 225 MG/1.5ML SOAJ Inject 225 mg into the skin every 30 (thirty) days. 07/01/22   Ines Onetha NOVAK, MD  ibuprofen  (ADVIL ,MOTRIN ) 200 MG tablet Take 400-800 mg by mouth every 6 (six) hours as needed for headache.    [provider]  meloxicam  (MOBIC ) 15 MG tablet Take 15 mg by mouth daily.    [provider]  metroNIDAZOLE  (FLAGYL ) 500 MG tablet Take 1 tablet (500 mg total) by mouth 2 (two) times daily. 01/14/23   Mayer, Jodi R, NP  ondansetron  (ZOFRAN ) 4 MG tablet Take 4 mg by mouth every 8 (eight) hours as needed for nausea or vomiting.    [provider]  Ubrogepant  (UBRELVY ) 50 MG TABS Take 50 mg by mouth daily as needed. Take one tablet at onset of headache, may repeat  1 tablet in 2 hours, no more than 2 tablets in 24 hours 03/30/20   Lomax, Amy, NP  valACYclovir  (VALTREX ) 1000 MG tablet TAKE 2 TABLETS BY MOUTH TWICE A DAY FOR 3 DAYS    [provider]    Family History Family History  Problem Relation Age of Onset   HIV Mother    Seizures Mother        secondary to drug abuse   Drug abuse Mother    Heart disease Maternal Grandmother    Hypertension Maternal Grandmother    Heart disease Maternal Grandfather    Diabetes Maternal Grandfather    Cancer Maternal Grandfather    Diabetes Paternal Grandmother    Cancer Paternal Grandfather    Eczema Daughter    Allergic rhinitis Daughter    Migraines Neg Hx    Colon cancer Neg Hx    Rectal cancer Neg Hx    Stomach cancer Neg Hx    Colon polyps Neg Hx    Esophageal cancer Neg Hx     Social History Social History   Tobacco Use   Smoking status: Never    Passive exposure: Past   Smokeless tobacco: Never  Vaping Use   Vaping status: Former    Devices: Tried  Substance Use Topics   Alcohol use: Yes    Comment: occ   Drug use: Yes    Types: Marijuana     Allergies   Stadol [butorphanol tartrate], Broccoli [brassica oleracea], Collard greens [wild lettuce extract (lactuca virosa)], and Kale   Review of Systems Review of Systems  Constitutional:  Negative for chills, fatigue and fever.  HENT:  Negative for congestion, ear pain, nosebleeds, postnasal drip, rhinorrhea, sinus pressure, sinus pain and sore throat.   Eyes:  Positive for discharge. Negative for photophobia, pain, redness, itching and visual disturbance.  Respiratory:  Negative for cough, shortness of breath and wheezing.   Gastrointestinal:  Negative for abdominal pain, diarrhea, nausea and vomiting.  Musculoskeletal:  Positive for myalgias. Negative for arthralgias.  Skin:  Negative for color change and rash.  Neurological:  Negative for light-headedness and headaches.  Hematological:  Negative for adenopathy. Does not bruise/bleed easily.  Psychiatric/Behavioral:  Negative for confusion and sleep disturbance.      Physical Exam Triage Vital Signs ED Triage Vitals [03/14/23 1417]  Encounter Vitals Group     BP (!) 139/91     Systolic BP Percentile      Diastolic BP Percentile      Pulse Rate 84     Resp 18     Temp 98.9 F (37.2 C)     Temp Source Oral     SpO2 98 %     Weight      Height      Head Circumference      Peak Flow      Pain Score 0     Pain Loc      Pain Education      Exclude from Growth Chart    No data found.  Updated Vital Signs BP (!) 139/91 (BP Location: Right Arm)   Pulse 84   Temp 98.9 F (37.2 C) (Oral)   Resp 18   LMP 11/12/2018 (Approximate) Comment: Pt states tube ablation as well  SpO2 98%   Visual Acuity Right Eye Distance:   Left Eye Distance:   Bilateral Distance:    Right Eye Near:   Left Eye Near:    Bilateral Near:  Physical Exam Vitals and nursing note reviewed.  Constitutional:       General: She is not in acute distress.    Appearance: Normal appearance. She is not ill-appearing.  HENT:     Head: Normocephalic and atraumatic.     Nose: No congestion or rhinorrhea.     Mouth/Throat:     Pharynx: No oropharyngeal exudate or posterior oropharyngeal erythema.  Eyes:     General: No scleral icterus.       Right eye: No foreign body, discharge or hordeolum.        Left eye: Hordeolum (medial, lower) present.No foreign body or discharge.     Extraocular Movements: Extraocular movements intact.     Right eye: Normal extraocular motion and no nystagmus.     Left eye: Normal extraocular motion and no nystagmus.     Conjunctiva/sclera: Conjunctivae normal.     Right eye: No hemorrhage.    Left eye: No hemorrhage.    Pupils: Pupils are equal, round, and reactive to light. Pupils are equal.     Funduscopic exam:    Right eye: No papilledema.        Left eye: No papilledema.     Slit lamp exam:    Right eye: No photophobia.     Left eye: No photophobia.  Pulmonary:     Effort: Pulmonary effort is normal. No respiratory distress.  Musculoskeletal:     Cervical back: Normal range of motion. No rigidity.  Lymphadenopathy:     Cervical: No cervical adenopathy.  Skin:    Capillary Refill: Capillary refill takes less than 2 seconds.     Coloration: Skin is not jaundiced.     Findings: No rash.  Neurological:     General: No focal deficit present.     Mental Status: She is alert and oriented to person, place, and time.     Motor: No weakness.     Gait: Gait normal.  Psychiatric:        Mood and Affect: Mood normal.        Behavior: Behavior normal.      UC Treatments / Results  Labs (all labs ordered are listed, but only abnormal results are displayed) Labs Reviewed - No data to display  EKG   Radiology No results found.  Procedures Procedures (including critical care time)  Medications Ordered in UC Medications - No data to display  Initial Impression  / Assessment and Plan / UC Course  I have reviewed the triage vital signs and the nursing notes.  Pertinent labs & imaging results that were available during my care of the patient were reviewed by me and considered in my medical decision making (see chart for details).     Appears to be stye No red flag sx  Final Clinical Impressions(s) / UC Diagnoses   Final diagnoses:  Hordeolum externum of left lower eyelid     Discharge Instructions      Use warm compress on eye 10 - 15 minutes 4 times per day     ED Prescriptions     Medication Sig Dispense Auth. Provider   erythromycin  ophthalmic ointment Place a 1/2 inch ribbon of ointment into the lower eyelid. 3.5 g Juleen Rush, PA-C      PDMP not reviewed this encounter.   Juleen Rush, PA-C 03/14/23 1444

## 2023-04-15 ENCOUNTER — Ambulatory Visit
Admission: EM | Admit: 2023-04-15 | Discharge: 2023-04-15 | Disposition: A | Discharge status: Home / Self Care | Payer: BC Managed Care – PPO | Attending: Family Medicine | Admitting: Family Medicine

## 2023-04-15 DIAGNOSIS — L03213 Periorbital cellulitis: Secondary | ICD-10-CM

## 2023-04-15 MED ORDER — PREDNISONE 20 MG PO TABS: ORAL_TABLET | ORAL | 0 refills | Status: DC

## 2023-04-15 MED ORDER — AMOXICILLIN-POT CLAVULANATE 875-125 MG PO TABS: 1.0000 | ORAL_TABLET | Freq: Two times a day (BID) | ORAL | 0 refills | Status: DC

## 2023-04-15 MED ORDER — FLUCONAZOLE 150 MG PO TABS: 150.0000 mg | ORAL_TABLET | ORAL | 0 refills | Status: DC

## 2023-04-15 NOTE — ED Provider Notes (Signed)
[facility]  Note:  This document was prepared using Conservation officer, historic buildings and may include unintentional dictation errors.  MRN: [id number] DOB: 04-13-1975  Subjective:   Morgan [name] is a 48 y.o. female presenting for 1 week history of persistent right eyelid swelling, pain across the right side of her face. She did see her eye specialist a couple of times already and was started on a Medrol dose pack, cephalexin, erythromycin ophthalmic ointment, acyclovir.  She is finishing all medications as of today.  Reports that she feels worse.  No current facility-administered medications for this encounter.  Current Outpatient Medications:    acyclovir (ZOVIRAX) 800 MG tablet, Take 4,000 mg by mouth daily., Disp: , Rfl:    cephALEXin (KEFLEX) 500 MG capsule, Take 500 mg by mouth 2 (two) times daily., Disp: , Rfl:    methylPREDNISolone (MEDROL DOSEPAK) 4 MG TBPK tablet, Take by mouth., Disp: , Rfl:    neomycin-polymyxin b-dexamethasone (MAXITROL) 3.5-10000-0.1 OINT, SMARTSIG:1 Inch(es) In Eye(s) Every Night, Disp: , Rfl:    baclofen (LIORESAL) 10 MG tablet, Take 1 tablet (10 mg total) by mouth 2 (two) times daily as needed for muscle spasms. (Patient not taking: Reported on 11/18/2022), Disp: 10 each, Rfl: 0   cyclobenzaprine (FLEXERIL) 5 MG tablet, Take 1 tablet (5 mg total) by mouth 3 (three) times daily as needed for muscle spasms. (Patient not taking: Reported on 11/18/2022), Disp: 30 tablet, Rfl: 0   erythromycin ophthalmic ointment, Place a 1/2 inch ribbon of ointment into the lower eyelid., Disp: 3.5 g, Rfl: 0   Fremanezumab-vfrm (AJOVY) 225 MG/1.5ML SOAJ, Inject 225 mg into the skin every 30 (thirty) days., Disp: 1.5 mL, Rfl: 11   ibuprofen (ADVIL,MOTRIN) 200 MG tablet, Take 400-800 mg by mouth every 6 (six) hours as needed for headache., Disp: , Rfl:    meloxicam (MOBIC) 15 MG tablet, Take 15 mg by mouth daily., Disp: , Rfl:    metroNIDAZOLE (FLAGYL)  500 MG tablet, Take 1 tablet (500 mg total) by mouth 2 (two) times daily., Disp: 14 tablet, Rfl: 0   ondansetron (ZOFRAN) 4 MG tablet, Take 4 mg by mouth every 8 (eight) hours as needed for nausea or vomiting., Disp: , Rfl:    Ubrogepant (UBRELVY) 50 MG TABS, Take 50 mg by mouth daily as needed. Take one tablet at onset of headache, may repeat 1 tablet in 2 hours, no more than 2 tablets in 24 hours, Disp: 10 tablet, Rfl: 11   valACYclovir (VALTREX) 1000 MG tablet, TAKE 2 TABLETS BY MOUTH TWICE A DAY FOR 3 DAYS, Disp: , Rfl:    Allergies  Allergen Reactions   Stadol [Butorphanol Tartrate] Itching   Broccoli [Brassica Oleracea] Rash   Collard Greens [Wild Lettuce Extract (Lactuca Virosa)] Rash   Kale Itching and Rash    Past Medical History:  Diagnosis Date   Abortion 03/09/2013   Allergy    Anxiety    Asthma    Depression    Eczema    Headache    Kidney stone    Migraines    Pregnancy induced hypertension    Sleep apnea      Past Surgical History:  Procedure Laterality Date   CERVICAL CERCLAGE     all 3 pregnancies   DILATION AND CURETTAGE OF UTERUS  03/05/1999   retained placenta   DILATION AND CURETTAGE OF UTERUS  03/04/2004   SAB   INDUCED ABORTION  2015   x 2   MOUTH  SURGERY  03/04/1997   TUBAL LIGATION      Family History  Problem Relation Age of Onset   HIV Mother    Seizures Mother        secondary to drug abuse   Drug abuse Mother    Heart disease Maternal Grandmother    Hypertension Maternal Grandmother    Heart disease Maternal Grandfather    Diabetes Maternal Grandfather    Cancer Maternal Grandfather    Diabetes Paternal Grandmother    Cancer Paternal Grandfather    Eczema Daughter    Allergic rhinitis Daughter    Migraines Neg Hx    Colon cancer Neg Hx    Rectal cancer Neg Hx    Stomach cancer Neg Hx    Colon polyps Neg Hx    Esophageal cancer Neg Hx     Social History   Tobacco Use   Smoking status: Never    Passive exposure: Past    Smokeless tobacco: Never  Vaping Use   Vaping status: Former   Devices: Tried  Substance Use Topics   Alcohol use: Yes    Comment: occ   Drug use: Yes    Types: Marijuana    ROS   Objective:   Vitals: BP (!) 152/107 (BP Location: Left Arm)   Pulse 65   Temp 98.3 F (36.8 C) (Oral)   Resp 16   LMP 11/12/2018 (Approximate) Comment: Pt states tube ablation as well  SpO2 97%   Physical Exam Constitutional:      General: She is not in acute distress.    Appearance: Normal appearance. She is well-developed. She is not ill-appearing, toxic-appearing or diaphoretic.  HENT:     Head: Normocephalic and atraumatic.     Nose: Nose normal.     Mouth/Throat:     Mouth: Mucous membranes are moist.  Eyes:     General: Lids are everted, no foreign bodies appreciated. Vision grossly intact. No scleral icterus.       Right eye: No foreign body, discharge or hordeolum.        Left eye: No foreign body, discharge or hordeolum.     Extraocular Movements: Extraocular movements intact.     Right eye: Normal extraocular motion.     Left eye: Normal extraocular motion and no nystagmus.     Conjunctiva/sclera:     Right eye: Right conjunctiva is not injected. No chemosis, exudate or hemorrhage.    Left eye: Left conjunctiva is not injected. No chemosis, exudate or hemorrhage.  Cardiovascular:     Rate and Rhythm: Normal rate.  Pulmonary:     Effort: Pulmonary effort is normal.  Skin:    General: Skin is warm and dry.  Neurological:     General: No focal deficit present.     Mental Status: She is alert and oriented to person, place, and time.  Psychiatric:        Mood and Affect: Mood normal.        Behavior: Behavior normal.     Assessment and Plan :   PDMP not reviewed this encounter.  1. Preseptal cellulitis of right upper eyelid    Will manage for preseptal cellulitis with Augmentin.  Given that patient reports significant worsening, offered prednisone.  Maintain strict ER  precautions.  Follow-up with eye specialist or present to the emergency room if continued worsening symptoms.   Wallis Bamberg, PA-C 04/15/23 1013

## 2023-04-15 NOTE — ED Triage Notes (Signed)
Pt reports swelling from he right eye bronw to the cheek bone x 1 week; pain in the right eye x 2-3 days. Reports she went to the eye Dr on 04/09/23 and 04/14/23 and was prescribed antibiotics, anti-inflammatory and antivirals and she do not feel better "The Dr just gave me meds".

## 2023-06-04 ENCOUNTER — Ambulatory Visit
Admission: EM | Admit: 2023-06-04 | Discharge: 2023-06-04 | Disposition: A | Discharge status: Home / Self Care | Attending: Family Medicine | Admitting: Family Medicine

## 2023-06-04 DIAGNOSIS — K529 Noninfective gastroenteritis and colitis, unspecified: Secondary | ICD-10-CM

## 2023-06-04 MED ORDER — LOPERAMIDE HCL 2 MG PO CAPS: 2.0000 mg | ORAL_CAPSULE | Freq: Two times a day (BID) | ORAL | 0 refills | Status: DC | PRN

## 2023-06-04 MED ORDER — ONDANSETRON 8 MG PO TBDP: 8.0000 mg | ORAL_TABLET | Freq: Three times a day (TID) | ORAL | 0 refills | Status: DC | PRN

## 2023-06-04 NOTE — Discharge Instructions (Signed)

## 2023-06-04 NOTE — ED Provider Notes (Signed)
 [facility]  Note:  This document was prepared using Conservation officer, historic buildings and may include unintentional dictation errors.  MRN: [id number] DOB: [birth year]  Subjective:   Morgan [name] is a 48 y.o. female presenting for 1 day history of abdominal cramping, multiple bouts of diarrhea that persisted overnight and into today.  Has also felt nausea without vomiting.  No respiratory symptoms.  No fever.  No bloody stools.  No fever, recent antibiotic use, hospitalizations or long distance travel.  Has not eaten raw foods, drank unfiltered water.  However, she does admit that she has been eating a lot of runny over easy eggs lately.  No history of GI disorders including Crohn's, IBS, ulcerative colitis.   No current facility-administered medications for this encounter.  Current Outpatient Medications:    acyclovir (ZOVIRAX) 800 MG tablet, Take 4,000 mg by mouth daily., Disp: , Rfl:    amoxicillin-clavulanate (AUGMENTIN) 875-125 MG tablet, Take 1 tablet by mouth 2 (two) times daily., Disp: 20 tablet, Rfl: 0   baclofen (LIORESAL) 10 MG tablet, Take 1 tablet (10 mg total) by mouth 2 (two) times daily as needed for muscle spasms. (Patient not taking: Reported on 11/18/2022), Disp: 10 each, Rfl: 0   cephALEXin (KEFLEX) 500 MG capsule, Take 500 mg by mouth 2 (two) times daily., Disp: , Rfl:    cyclobenzaprine (FLEXERIL) 5 MG tablet, Take 1 tablet (5 mg total) by mouth 3 (three) times daily as needed for muscle spasms. (Patient not taking: Reported on 11/18/2022), Disp: 30 tablet, Rfl: 0   erythromycin ophthalmic ointment, Place a 1/2 inch ribbon of ointment into the lower eyelid., Disp: 3.5 g, Rfl: 0   fluconazole (DIFLUCAN) 150 MG tablet, Take 1 tablet (150 mg total) by mouth every 3 (three) days., Disp: 5 tablet, Rfl: 0   Fremanezumab-vfrm (AJOVY) 225 MG/1.5ML SOAJ, Inject 225 mg into the skin every 30 (thirty) days., Disp: 1.5 mL, Rfl: 11   ibuprofen (ADVIL,MOTRIN)  200 MG tablet, Take 400-800 mg by mouth every 6 (six) hours as needed for headache., Disp: , Rfl:    meloxicam (MOBIC) 15 MG tablet, Take 15 mg by mouth daily., Disp: , Rfl:    methylPREDNISolone (MEDROL DOSEPAK) 4 MG TBPK tablet, Take by mouth., Disp: , Rfl:    metroNIDAZOLE (FLAGYL) 500 MG tablet, Take 1 tablet (500 mg total) by mouth 2 (two) times daily., Disp: 14 tablet, Rfl: 0   neomycin-polymyxin b-dexamethasone (MAXITROL) 3.5-10000-0.1 OINT, SMARTSIG:1 Inch(es) In Eye(s) Every Night, Disp: , Rfl:    ondansetron (ZOFRAN) 4 MG tablet, Take 4 mg by mouth every 8 (eight) hours as needed for nausea or vomiting., Disp: , Rfl:    predniSONE (DELTASONE) 20 MG tablet, Take 2 tablets daily with breakfast., Disp: 10 tablet, Rfl: 0   Ubrogepant (UBRELVY) 50 MG TABS, Take 50 mg by mouth daily as needed. Take one tablet at onset of headache, may repeat 1 tablet in 2 hours, no more than 2 tablets in 24 hours, Disp: 10 tablet, Rfl: 11   valACYclovir (VALTREX) 1000 MG tablet, TAKE 2 TABLETS BY MOUTH TWICE A DAY FOR 3 DAYS, Disp: , Rfl:    Allergies  Allergen Reactions   Stadol [Butorphanol Tartrate] Itching   Broccoli [Brassica Oleracea] Rash   Collard Greens [Wild Lettuce Extract (Lactuca Virosa)] Rash   Kale Itching and Rash    Past Medical History:  Diagnosis Date   Abortion 03/09/2013   Allergy    Anxiety    Asthma  Depression    Eczema    Headache    Kidney stone    Migraines    Pregnancy induced hypertension    Sleep apnea      Past Surgical History:  Procedure Laterality Date   CERVICAL CERCLAGE     all 3 pregnancies   DILATION AND CURETTAGE OF UTERUS  03/05/1999   retained placenta   DILATION AND CURETTAGE OF UTERUS  03/04/2004   SAB   INDUCED ABORTION  2015   x 2   MOUTH SURGERY  03/04/1997   TUBAL LIGATION      Family History  Problem Relation Age of Onset   HIV Mother    Seizures Mother        secondary to drug abuse   Drug abuse Mother    Heart disease  Maternal Grandmother    Hypertension Maternal Grandmother    Heart disease Maternal Grandfather    Diabetes Maternal Grandfather    Cancer Maternal Grandfather    Diabetes Paternal Grandmother    Cancer Paternal Grandfather    Eczema Daughter    Allergic rhinitis Daughter    Migraines Neg Hx    Colon cancer Neg Hx    Rectal cancer Neg Hx    Stomach cancer Neg Hx    Colon polyps Neg Hx    Esophageal cancer Neg Hx     Social History   Tobacco Use   Smoking status: Never    Passive exposure: Past   Smokeless tobacco: Never  Vaping Use   Vaping status: Former   Devices: Tried  Substance Use Topics   Alcohol use: Yes    Comment: occ   Drug use: Yes    Types: Marijuana    ROS   Objective:   Vitals: BP 118/84 (BP Location: Left Arm)   Pulse 70   Temp 98.3 F (36.8 C) (Oral)   Resp 16   LMP 11/12/2018 (Approximate) Comment: Pt states tube ablation as well  SpO2 96%   Physical Exam Constitutional:      General: She is not in acute distress.    Appearance: Normal appearance. She is well-developed. She is not ill-appearing, toxic-appearing or diaphoretic.  HENT:     Head: Normocephalic and atraumatic.     Nose: Nose normal.     Mouth/Throat:     Mouth: Mucous membranes are moist.     Pharynx: Oropharynx is clear.  Eyes:     General: No scleral icterus.       Right eye: No discharge.        Left eye: No discharge.     Extraocular Movements: Extraocular movements intact.     Conjunctiva/sclera: Conjunctivae normal.  Cardiovascular:     Rate and Rhythm: Normal rate.  Pulmonary:     Effort: Pulmonary effort is normal.  Abdominal:     General: Bowel sounds are increased. There is no distension.     Palpations: Abdomen is soft. There is no mass.     Tenderness: There is generalized abdominal tenderness (lower). There is no right CVA tenderness, left CVA tenderness, guarding or rebound.  Skin:    General: Skin is warm and dry.  Neurological:     General: No  focal deficit present.     Mental Status: She is alert and oriented to person, place, and time.  Psychiatric:        Mood and Affect: Mood normal.        Behavior: Behavior normal.  Thought Content: Thought content normal.        Judgment: Judgment normal.     Assessment and Plan :   PDMP not reviewed this encounter.  1. Colitis    Recommended managing for colitis with supportive care, Zofran, loperamide.  Push fluids.  No signs of acute abdomen.  Low GI risk factors.  Counseled patient on potential for adverse effects with medications prescribed/recommended today, ER and return-to-clinic precautions discussed, patient verbalized understanding.    Wallis Bamberg, [place] 06/04/23 1734

## 2023-06-04 NOTE — ED Triage Notes (Signed)
 Pt c/o diarrhea, abd cramps started yesterday morning-states now she is only having diarrhea after she eats-NAD-steady gait

## 2023-06-23 ENCOUNTER — Encounter: Payer: Self-pay | Admitting: Dermatology

## 2023-06-23 ENCOUNTER — Ambulatory Visit (INDEPENDENT_AMBULATORY_CARE_PROVIDER_SITE_OTHER): Payer: BC Managed Care – PPO | Admitting: Dermatology

## 2023-06-23 ENCOUNTER — Ambulatory Visit: Payer: BC Managed Care – PPO | Admitting: Allergy

## 2023-06-23 VITALS — BP 126/83

## 2023-06-23 DIAGNOSIS — D2372 Other benign neoplasm of skin of left lower limb, including hip: Secondary | ICD-10-CM

## 2023-06-23 DIAGNOSIS — B079 Viral wart, unspecified: Secondary | ICD-10-CM | POA: Diagnosis not present

## 2023-06-23 DIAGNOSIS — D239 Other benign neoplasm of skin, unspecified: Secondary | ICD-10-CM

## 2023-06-23 NOTE — Progress Notes (Signed)
   New Patient Visit   Subjective  Morgan [name] is a 48 y.o. female who presents for the following: New Pt - Skin Lesion  Patient states she has skin issue located at the L eyebrow that she would like to have examined. Patient reports the areas have been there for 6 years. She reports the areas are bothersome. Patient rates irritation 5 out of 10. She states that the areas have not spread. Patient reports she has not previously been treated for these areas. Patient denied Hx of bx. Patient denied family history of skin cancer(s).  The following portions of the chart were reviewed this encounter and updated as appropriate: medications, allergies, medical history  Review of Systems:  No other skin or systemic complaints except as noted in HPI or Assessment and Plan.  Objective  Well appearing patient in no apparent distress; mood and affect are within normal limits.   A focused examination was performed of the following areas: face & leg   Relevant exam findings are noted in the Assessment and Plan.            Left Eyebrow (2) Verrucous papules   Assessment & Plan   DERMATOFIBROMA Exam: Firm pink/brown papulenodule with dimple sign located at the lower left leg  Treatment Plan: A dermatofibroma is a benign growth possibly related to trauma, such as an insect bite, cut from shaving, or inflamed acne-type bump.  Treatment options to remove include shave or excision with resulting scar and risk of recurrence.  Since benign-appearing and not bothersome, will observe for now.    FILIFORM WART (2) Left Eyebrow (2) Destruction of lesion - Left Eyebrow (2) Complexity: simple   Destruction method: cryotherapy   Informed consent: discussed and consent obtained   Timeout:  patient name, date of birth, surgical site, and procedure verified Lesion destroyed using liquid nitrogen: Yes   Post-procedure details: wound care instructions given    No follow-ups on  file.    Documentation: I have reviewed the above documentation for accuracy and completeness, and I agree with the above.   I, Shirron Louanne Roussel, CMA, am acting as scribe for [employer], DO.   Louana Roup, DO

## 2023-06-23 NOTE — Patient Instructions (Addendum)

## 2023-07-16 ENCOUNTER — Ambulatory Visit: Admitting: Allergy

## 2023-08-14 ENCOUNTER — Ambulatory Visit
Admission: EM | Admit: 2023-08-14 | Discharge: 2023-08-14 | Disposition: A | Discharge status: Home / Self Care | Attending: Family Medicine | Admitting: Family Medicine

## 2023-08-14 DIAGNOSIS — M722 Plantar fascial fibromatosis: Secondary | ICD-10-CM

## 2023-08-14 DIAGNOSIS — M51369 Other intervertebral disc degeneration, lumbar region without mention of lumbar back pain or lower extremity pain: Secondary | ICD-10-CM | POA: Diagnosis not present

## 2023-08-14 MED ORDER — CYCLOBENZAPRINE HCL 5 MG PO TABS: 5.0000 mg | ORAL_TABLET | Freq: Every evening | ORAL | 0 refills | Status: AC | PRN

## 2023-08-14 MED ORDER — PREDNISONE 20 MG PO TABS: ORAL_TABLET | ORAL | 0 refills | Status: DC

## 2023-08-14 NOTE — ED Provider Notes (Signed)
 [facility]  Note:  This document was prepared using Conservation officer, historic buildings and may include unintentional dictation errors.  MRN: [id number] DOB: 12/15/1975  Subjective:   Morgan [name] is a 48 y.o. female presenting for 2 to 3-day history of acute onset persistent lower back pain and bilateral heel pain.  Patient has known well-documented history of degenerative disc disease of the lumbar region.  The heel pain is new however.  She does see an orthopedist.  Tries to practice good back care.  Unfortunately, her work is physically demanding as she does a job at [employer].  No fall, trauma, numbness or tingling, saddle paresthesia, changes to bowel or urinary habits, radicular symptoms.   No current facility-administered medications for this encounter.  Current Outpatient Medications:    Fremanezumab -vfrm (AJOVY ) 225 MG/1.5ML SOAJ, Inject 225 mg into the skin every 30 (thirty) days., Disp: 1.5 mL, Rfl: 11   ondansetron  (ZOFRAN ) 4 MG tablet, Take 4 mg by mouth every 8 (eight) hours as needed for nausea or vomiting., Disp: , Rfl:    ondansetron  (ZOFRAN -ODT) 8 MG disintegrating tablet, Take 1 tablet (8 mg total) by mouth every 8 (eight) hours as needed for nausea or vomiting., Disp: 20 tablet, Rfl: 0   Ubrogepant  (UBRELVY ) 50 MG TABS, Take 50 mg by mouth daily as needed. Take one tablet at onset of headache, may repeat 1 tablet in 2 hours, no more than 2 tablets in 24 hours, Disp: 10 tablet, Rfl: 11   valACYclovir (VALTREX) 1000 MG tablet, TAKE 2 TABLETS BY MOUTH TWICE A DAY FOR 3 DAYS, Disp: , Rfl:    ibuprofen  (ADVIL ,MOTRIN ) 200 MG tablet, Take 400-800 mg by mouth every 6 (six) hours as needed for headache., Disp: , Rfl:    loperamide  (IMODIUM ) 2 MG capsule, Take 1 capsule (2 mg total) by mouth 2 (two) times daily as needed for diarrhea or loose stools., Disp: 14 capsule, Rfl: 0   Allergies  Allergen Reactions   Stadol [Butorphanol Tartrate] Itching   Broccoli  [Brassica Oleracea] Rash   Collard Greens [Wild Lettuce Extract (Lactuca Virosa)] Rash   Kale Itching and Rash    Past Medical History:  Diagnosis Date   Abortion 03/09/2013   Allergy     Anxiety    Asthma    Depression    Eczema    Headache    Kidney stone    Migraines    Pregnancy induced hypertension    Sleep apnea      Past Surgical History:  Procedure Laterality Date   CERVICAL CERCLAGE     all 3 pregnancies   DILATION AND CURETTAGE OF UTERUS  03/05/1999   retained placenta   DILATION AND CURETTAGE OF UTERUS  03/04/2004   SAB   INDUCED ABORTION  2015   x 2   MOUTH SURGERY  03/04/1997   TUBAL LIGATION      Family History  Problem Relation Age of Onset   HIV Mother    Seizures Mother        secondary to drug abuse   Drug abuse Mother    Heart disease Maternal Grandmother    Hypertension Maternal Grandmother    Heart disease Maternal Grandfather    Diabetes Maternal Grandfather    Cancer Maternal Grandfather    Diabetes Paternal Grandmother    Cancer Paternal Grandfather    Eczema Daughter    Allergic rhinitis Daughter    Migraines Neg Hx    Colon cancer Neg Hx  Rectal cancer Neg Hx    Stomach cancer Neg Hx    Colon polyps Neg Hx    Esophageal cancer Neg Hx     Social History   Tobacco Use   Smoking status: Never    Passive exposure: Past   Smokeless tobacco: Never  Vaping Use   Vaping status: Former   Devices: Tried  Substance Use Topics   Alcohol use: Yes    Comment: occ   Drug use: Yes    Types: Marijuana    ROS   Objective:   Vitals: BP 123/85 (BP Location: Left Arm)   Pulse 78   Temp 98.3 F (36.8 C) (Oral)   Resp 18   LMP 11/12/2018 (Approximate) Comment: Pt states tube ablation as well  SpO2 95%   Physical Exam Constitutional:      General: She is not in acute distress.    Appearance: Normal appearance. She is well-developed. She is not ill-appearing, toxic-appearing or diaphoretic.  HENT:     Head: Normocephalic  and atraumatic.     Nose: Nose normal.     Mouth/Throat:     Mouth: Mucous membranes are moist.   Eyes:     General: No scleral icterus.       Right eye: No discharge.        Left eye: No discharge.     Extraocular Movements: Extraocular movements intact.    Cardiovascular:     Rate and Rhythm: Normal rate.  Pulmonary:     Effort: Pulmonary effort is normal.   Musculoskeletal:     Lumbar back: Spasms and tenderness (lumbar paraspinal muscles) present. No swelling, edema, deformity, signs of trauma, lacerations or bony tenderness. Normal range of motion. Negative right straight leg raise test and negative left straight leg raise test. No scoliosis.     Comments: Pain at the base of each heel worse with dorsiflexion of the toes.   Skin:    General: Skin is warm and dry.   Neurological:     General: No focal deficit present.     Mental Status: She is alert and oriented to person, place, and time.     Motor: No weakness.     Coordination: Coordination normal.     Gait: Gait normal.     Deep Tendon Reflexes: Reflexes normal.   Psychiatric:        Mood and Affect: Mood normal.        Behavior: Behavior normal.        Thought Content: Thought content normal.        Judgment: Judgment normal.     Assessment and Plan :   PDMP not reviewed this encounter.  1. Degeneration of intervertebral disc of lumbar region, unspecified whether pain present   2. Plantar fasciitis, bilateral    Recommended oral prednisone  course for acute on chronic back pain likely related to her underlying degenerative disc disease.  This would also help with her significant plantar fasciitis.  Referral placed to podiatry.  Follow up with her orthopedist for a recheck on her back. Counseled patient on potential for adverse effects with medications prescribed/recommended today, ER and return-to-clinic precautions discussed, patient verbalized understanding.    Adolph Hoop, [place] 08/14/23 1307

## 2023-08-14 NOTE — ED Triage Notes (Signed)
 Patient presents to the office for lower back pain and bilateral heel pain. Patient works at [employer].

## 2023-08-20 ENCOUNTER — Ambulatory Visit (INDEPENDENT_AMBULATORY_CARE_PROVIDER_SITE_OTHER): Admitting: Podiatry

## 2023-08-20 DIAGNOSIS — Z91199 Patient's noncompliance with other medical treatment and regimen due to unspecified reason: Secondary | ICD-10-CM

## 2023-08-20 NOTE — Progress Notes (Signed)
 No show

## 2023-09-12 ENCOUNTER — Ambulatory Visit
Admission: EM | Admit: 2023-09-12 | Discharge: 2023-09-12 | Disposition: A | Discharge status: Home / Self Care | Attending: Family Medicine | Admitting: Family Medicine

## 2023-09-12 ENCOUNTER — Other Ambulatory Visit: Payer: Self-pay

## 2023-09-12 DIAGNOSIS — Z113 Encounter for screening for infections with a predominantly sexual mode of transmission: Secondary | ICD-10-CM | POA: Insufficient documentation

## 2023-09-12 DIAGNOSIS — R102 Pelvic and perineal pain: Secondary | ICD-10-CM | POA: Diagnosis present

## 2023-09-12 DIAGNOSIS — Z780 Asymptomatic menopausal state: Secondary | ICD-10-CM | POA: Insufficient documentation

## 2023-09-12 MED ORDER — CELECOXIB 200 MG PO CAPS
200.0000 mg | ORAL_CAPSULE | Freq: Two times a day (BID) | ORAL | 0 refills | Status: DC
Start: 2023-09-12 — End: 2023-10-22

## 2023-09-12 NOTE — Progress Notes (Unsigned)
 Location:   Clinic [facility]   Place of Service:    Provider: Larwance Hark NP  Code Status: DNR Goals of Care:     09/15/2023   10:05 AM  Advanced Directives  Does Patient Have a Medical Advance Directive? No  Would patient like information on creating a medical advance directive? No - Patient declined     Chief Complaint  Patient presents with   Annual Exam    Physical    HPI: Patient is a 48 y.o. female seen today for an acute visit for Discussed the use of AI scribe software for clinical note transcription with the patient, who gave verbal consent to proceed.  History of Present Illness  Morgan [name] is a 48 year old female who presents with concerns about medication refills and a possible urinary tract infection.  She experiences recurrent herpes labialis, with two episodes occurring last week. She typically takes Valtrex  as needed, especially during periods of stress or when she has a cold. She recently had a [facility] appointment and received a prescription for Valtrex , which she is currently using for a day  She experiences migraines and takes a monthly injection along with Ubrelvy  as needed for acute episodes. Her migraines are not as severe as they used to be.  She uses a CPAP machine for sleep apnea.  She is concerned about a possible urinary tract infection, reporting symptoms of dysuria, increased frequency, and a distinct odor. She mentions having had UTIs frequently in the past and recognizes the symptoms. Urgent care 09/12/23 for acute pelvic pain, comes and goes, not today, f/u GYN  Migraine takes Ajovy , Ubrogepant , Ibuprofen   OSA on Cpap  Back pain, fascitis in foot, seen podiatrist  Hgb A1c 6.0 09/12/23, repeat Hgb a1c  Herpes simplex, mouth sore, prn Valtrex .    Past Medical History:  Diagnosis Date   Abortion 03/09/2013   Allergy     Anxiety    Asthma    Depression    Eczema    Headache    Kidney stone    Migraines    Pregnancy induced  hypertension    Sleep apnea     Past Surgical History:  Procedure Laterality Date   CERVICAL CERCLAGE     all 3 pregnancies   DILATION AND CURETTAGE OF UTERUS  03/05/1999   retained placenta   DILATION AND CURETTAGE OF UTERUS  03/04/2004   SAB   INDUCED ABORTION  2015   x 2   MOUTH SURGERY  03/04/1997   TUBAL LIGATION      Allergies  Allergen Reactions   Stadol [Butorphanol Tartrate] Itching   Broccoli [Brassica Oleracea] Rash   Collard Greens [Wild Lettuce Extract (Lactuca Virosa)] Rash   Kale Itching and Rash    Allergies as of 09/15/2023       Reactions   Stadol [butorphanol Tartrate] Itching   Broccoli [brassica Oleracea] Rash   Collard Greens [wild Lettuce Extract (lactuca Virosa)] Rash   Kale Itching, Rash        Medication List        Accurate as of September 15, 2023 11:59 PM. If you have any questions, ask your nurse or doctor.          Ajovy  225 MG/1.5ML Soaj Generic drug: Fremanezumab -vfrm Inject 225 mg into the skin every 30 (thirty) days.   celecoxib  200 MG capsule Commonly known as: CeleBREX  Take 1 capsule (200 mg total) by mouth 2 (two) times daily.   cyclobenzaprine  5 MG tablet Commonly  known as: FLEXERIL  Take 1 tablet (5 mg total) by mouth at bedtime as needed.   ibuprofen  200 MG tablet Commonly known as: ADVIL  Take 400-800 mg by mouth every 6 (six) hours as needed for headache.   loperamide  2 MG capsule Commonly known as: IMODIUM  Take 1 capsule (2 mg total) by mouth 2 (two) times daily as needed for diarrhea or loose stools.   meloxicam  15 MG tablet Commonly known as: MOBIC  Take 1 tablet (15 mg total) by mouth daily. Started by: Asberry Failing   ondansetron  4 MG tablet Commonly known as: ZOFRAN  Take 4 mg by mouth every 8 (eight) hours as needed for nausea or vomiting.   ondansetron  8 MG disintegrating tablet Commonly known as: ZOFRAN -ODT Take 1 tablet (8 mg total) by mouth every 8 (eight) hours as needed for nausea or  vomiting.   predniSONE  20 MG tablet Commonly known as: DELTASONE  Take 2 tablets daily with breakfast.   Ubrelvy  50 MG Tabs Generic drug: Ubrogepant  Take 50 mg by mouth daily as needed. Take one tablet at onset of headache, may repeat 1 tablet in 2 hours, no more than 2 tablets in 24 hours   valACYclovir  1000 MG tablet Commonly known as: Valtrex  Take 2 tablets (2,000 mg total) by mouth every 12 (twelve) hours as needed for up to 1 day. What changed: See the new instructions. Changed by: Layten Aiken X Nivan Melendrez        Review of Systems:  Review of Systems  Constitutional:  Negative for appetite change and fatigue.  HENT:  Positive for trouble swallowing. Negative for congestion.   Eyes:  Negative for visual disturbance.  Respiratory:  Negative for cough and wheezing.   Cardiovascular:  Negative for palpitations and leg swelling.  Gastrointestinal:  Negative for constipation.  Genitourinary:  Positive for dysuria, frequency and vaginal discharge. Negative for hematuria and urgency.  Musculoskeletal:  Positive for back pain.  Skin:  Negative for color change.       Dry skin  Neurological:  Negative for speech difficulty, weakness and headaches.       No HA today  Psychiatric/Behavioral:  Negative for confusion and sleep disturbance. The patient is not nervous/anxious.     Health Maintenance  Topic Date Due   Hepatitis B Vaccines (1 of 3 - 19+ 3-dose series) Never done   Cervical Cancer Screening (HPV/Pap Cotest)  Never done   COVID-19 Vaccine (1) 10/01/2023 (Originally 03/05/1980)   INFLUENZA VACCINE  10/03/2023   Colonoscopy  12/10/2025   DTaP/Tdap/Td (2 - Td or Tdap) 04/16/2028   Hepatitis C Screening  Completed   HIV Screening  Completed   HPV VACCINES  Aged Out   Meningococcal B Vaccine  Aged Out    Physical Exam: Vitals:   09/15/23 1010  BP: 134/82  Pulse: 69  Resp: 20  Temp: 98 F (36.7 C)  SpO2: 97%  Weight: 223 lb 3.2 oz (101.2 kg)  Height: 5' 5.25 (1.657 m)    Body mass index is 36.86 kg/m. Physical Exam Vitals and nursing note reviewed.  Constitutional:      Appearance: Normal appearance.  HENT:     Head: Normocephalic and atraumatic.     Nose: Nose normal.     Mouth/Throat:     Mouth: Mucous membranes are moist.  Eyes:     Extraocular Movements: Extraocular movements intact.     Conjunctiva/sclera: Conjunctivae normal.     Pupils: Pupils are equal, round, and reactive to light.  Cardiovascular:  Rate and Rhythm: Normal rate and regular rhythm.     Heart sounds: No murmur heard. Pulmonary:     Effort: Pulmonary effort is normal.     Breath sounds: No wheezing, rhonchi or rales.  Abdominal:     General: Bowel sounds are normal.     Palpations: Abdomen is soft.     Tenderness: There is no abdominal tenderness. There is no right CVA tenderness, left CVA tenderness, guarding or rebound.  Musculoskeletal:        General: No tenderness. Normal range of motion.     Cervical back: Normal range of motion and neck supple.     Right lower leg: No edema.     Left lower leg: No edema.  Skin:    General: Skin is warm and dry.     Findings: No rash.  Neurological:     General: No focal deficit present.     Mental Status: She is alert and oriented to person, place, and time. Mental status is at baseline.     Motor: No weakness.     Coordination: Coordination normal.     Gait: Gait normal.  Psychiatric:        Mood and Affect: Mood normal.        Behavior: Behavior normal.        Thought Content: Thought content normal.        Judgment: Judgment normal.     Labs reviewed: Basic Metabolic Panel: No results for input(s): NA, K, CL, CO2, GLUCOSE, BUN, CREATININE, CALCIUM, MG, PHOS, TSH in the last 8760 hours. Liver Function Tests: No results for input(s): AST, ALT, ALKPHOS, BILITOT, PROT, ALBUMIN in the last 8760 hours. No results for input(s): LIPASE, AMYLASE in the last 8760 hours. No results  for input(s): AMMONIA in the last 8760 hours. CBC: No results for input(s): WBC, NEUTROABS, HGB, HCT, MCV, PLT in the last 8760 hours. Lipid Panel: No results for input(s): CHOL, HDL, LDLCALC, TRIG, CHOLHDL, LDLDIRECT in the last 8760 hours. Lab Results  Component Value Date   HGBA1C 6.2 (H) 09/15/2023    Procedures since last visit: DG Foot Complete Right Result Date: 09/15/2023 Please see detailed radiograph report in office note.  DG Ankle 2 Views Right Result Date: 09/15/2023 Please see detailed radiograph report in office note.  DG Foot Complete Left Result Date: 09/15/2023 Please see detailed radiograph report in office note.  DG Ankle 2 Views Left Result Date: 09/15/2023 Please see detailed radiograph report in office note.   Assessment/Plan Assessment & Plan  Suspected urinary tract infection Symptoms suggest UTI with dysuria, increased frequency, and odor.  Migraine Chronic migraines improved with monthly injection and Ubrelvy  for acute episodes.  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 - Refill Valtrex  prescription as needed. - Permit prescription refills without appointment.     Labs/tests ordered:  check Hgb A1c, UA C/S Next appt:  4 months.

## 2023-09-12 NOTE — ED Provider Notes (Signed)
 [facility]  Note:  This document was prepared using Conservation officer, historic buildings and may include unintentional dictation errors.  MRN: [id number] DOB: [birth year]  Subjective:   Morgan [name] is a 48 y.o. female presenting for 1 day history of pelvic pain, pelvic cramping.  Patient is postmenopausal.  No concern for pregnancy.  Would like STI testing.  Denies fever, n/v, abdominal pain, rashes, dysuria, urinary frequency, hematuria, vaginal discharge.  She does have a history of an ovarian cyst and reports that this does feel similar.  Has a gynecologist but was unable to get in with them today.  No current facility-administered medications for this encounter.  Current Outpatient Medications:    cyclobenzaprine  (FLEXERIL ) 5 MG tablet, Take 1 tablet (5 mg total) by mouth at bedtime as needed., Disp: 30 tablet, Rfl: 0   Fremanezumab -vfrm (AJOVY ) 225 MG/1.5ML SOAJ, Inject 225 mg into the skin every 30 (thirty) days., Disp: 1.5 mL, Rfl: 11   ibuprofen  (ADVIL ,MOTRIN ) 200 MG tablet, Take 400-800 mg by mouth every 6 (six) hours as needed for headache., Disp: , Rfl:    loperamide  (IMODIUM ) 2 MG capsule, Take 1 capsule (2 mg total) by mouth 2 (two) times daily as needed for diarrhea or loose stools., Disp: 14 capsule, Rfl: 0   ondansetron  (ZOFRAN ) 4 MG tablet, Take 4 mg by mouth every 8 (eight) hours as needed for nausea or vomiting., Disp: , Rfl:    ondansetron  (ZOFRAN -ODT) 8 MG disintegrating tablet, Take 1 tablet (8 mg total) by mouth every 8 (eight) hours as needed for nausea or vomiting., Disp: 20 tablet, Rfl: 0   predniSONE  (DELTASONE ) 20 MG tablet, Take 2 tablets daily with breakfast., Disp: 10 tablet, Rfl: 0   Ubrogepant  (UBRELVY ) 50 MG TABS, Take 50 mg by mouth daily as needed. Take one tablet at onset of headache, may repeat 1 tablet in 2 hours, no more than 2 tablets in 24 hours, Disp: 10 tablet, Rfl: 11   valACYclovir  (VALTREX ) 1000 MG tablet, TAKE 2 TABLETS  BY MOUTH TWICE A DAY FOR 3 DAYS, Disp: , Rfl:    Allergies  Allergen Reactions   Stadol [Butorphanol Tartrate] Itching   Broccoli [Brassica Oleracea] Rash   Collard Greens [Wild Lettuce Extract (Lactuca Virosa)] Rash   Kale Itching and Rash    Past Medical History:  Diagnosis Date   Abortion 03/09/2013   Allergy     Anxiety    Asthma    Depression    Eczema    Headache    Kidney stone    Migraines    Pregnancy induced hypertension    Sleep apnea      Past Surgical History:  Procedure Laterality Date   CERVICAL CERCLAGE     all 3 pregnancies   DILATION AND CURETTAGE OF UTERUS  03/05/1999   retained placenta   DILATION AND CURETTAGE OF UTERUS  03/04/2004   SAB   INDUCED ABORTION  2015   x 2   MOUTH SURGERY  03/04/1997   TUBAL LIGATION      Family History  Problem Relation Age of Onset   HIV Mother    Seizures Mother        secondary to drug abuse   Drug abuse Mother    Heart disease Maternal Grandmother    Hypertension Maternal Grandmother    Heart disease Maternal Grandfather    Diabetes Maternal Grandfather    Cancer Maternal Grandfather    Diabetes Paternal Grandmother    Cancer Paternal  Grandfather    Eczema Daughter    Allergic rhinitis Daughter    Migraines Neg Hx    Colon cancer Neg Hx    Rectal cancer Neg Hx    Stomach cancer Neg Hx    Colon polyps Neg Hx    Esophageal cancer Neg Hx     Social History   Tobacco Use   Smoking status: Never    Passive exposure: Past   Smokeless tobacco: Never  Vaping Use   Vaping status: Former   Devices: Tried  Substance Use Topics   Alcohol use: Yes    Comment: occ   Drug use: Yes    Types: Marijuana    ROS   Objective:   Vitals: LMP 11/12/2018 (Approximate) Comment: Pt states tube ablation as well  Physical Exam Constitutional:      General: She is not in acute distress.    Appearance: Normal appearance. She is well-developed. She is not ill-appearing, toxic-appearing or diaphoretic.   HENT:     Head: Normocephalic and atraumatic.     Nose: Nose normal.     Mouth/Throat:     Mouth: Mucous membranes are moist.  Eyes:     General: No scleral icterus.       Right eye: No discharge.        Left eye: No discharge.     Extraocular Movements: Extraocular movements intact.     Conjunctiva/sclera: Conjunctivae normal.  Cardiovascular:     Rate and Rhythm: Normal rate.  Pulmonary:     Effort: Pulmonary effort is normal.  Abdominal:     General: Bowel sounds are normal. There is no distension.     Palpations: Abdomen is soft. There is no mass.     Tenderness: There is abdominal tenderness in the suprapubic area. There is no right CVA tenderness, left CVA tenderness, guarding or rebound.  Skin:    General: Skin is warm and dry.  Neurological:     General: No focal deficit present.     Mental Status: She is alert and oriented to person, place, and time.  Psychiatric:        Mood and Affect: Mood normal.        Behavior: Behavior normal.        Thought Content: Thought content normal.        Judgment: Judgment normal.     Assessment and Plan :   PDMP not reviewed this encounter.  1. Acute pelvic pain, female    Labs pending will treat as appropriate based off of results.  Deferred urinary testing given lack of urinary symptoms.  Recommended pain management for her pelvic pain likely related to her recurrent ovarian cyst.  Follow-up with gynecology closely.  Counseled patient on potential for adverse effects with medications prescribed/recommended today, ER and return-to-clinic precautions discussed, patient verbalized understanding.    Christopher Savannah, [place] 09/12/23 1812

## 2023-09-12 NOTE — ED Triage Notes (Signed)
 Pt c/o bad pelvic cramps and vaginal pain started today. Pt states no longer gets her menstrual. Pt states last menstrual was in 2022

## 2023-09-15 ENCOUNTER — Ambulatory Visit: Payer: BC Managed Care – PPO | Admitting: Nurse Practitioner

## 2023-09-15 ENCOUNTER — Encounter: Payer: Self-pay | Admitting: Podiatry

## 2023-09-15 ENCOUNTER — Ambulatory Visit (INDEPENDENT_AMBULATORY_CARE_PROVIDER_SITE_OTHER)

## 2023-09-15 ENCOUNTER — Ambulatory Visit (INDEPENDENT_AMBULATORY_CARE_PROVIDER_SITE_OTHER): Admitting: Podiatry

## 2023-09-15 ENCOUNTER — Encounter: Payer: Self-pay | Admitting: Nurse Practitioner

## 2023-09-15 VITALS — BP 134/82 | HR 69 | Temp 98.0°F | Resp 20 | Ht 65.25 in | Wt 223.2 lb

## 2023-09-15 DIAGNOSIS — R7303 Prediabetes: Secondary | ICD-10-CM | POA: Diagnosis not present

## 2023-09-15 DIAGNOSIS — M779 Enthesopathy, unspecified: Secondary | ICD-10-CM | POA: Diagnosis not present

## 2023-09-15 DIAGNOSIS — M722 Plantar fascial fibromatosis: Secondary | ICD-10-CM

## 2023-09-15 DIAGNOSIS — R300 Dysuria: Secondary | ICD-10-CM | POA: Diagnosis not present

## 2023-09-15 LAB — CERVICOVAGINAL ANCILLARY ONLY
Bacterial Vaginitis (gardnerella): POSITIVE — AB
Candida Glabrata: NEGATIVE
Candida Vaginitis: NEGATIVE
Chlamydia: NEGATIVE
Comment: NEGATIVE
Comment: NEGATIVE
Comment: NEGATIVE
Comment: NEGATIVE
Comment: NEGATIVE
Comment: NORMAL
Neisseria Gonorrhea: NEGATIVE
Trichomonas: NEGATIVE

## 2023-09-15 LAB — POCT URINALYSIS DIPSTICK
Bilirubin, UA: NEGATIVE
Glucose, UA: NEGATIVE
Ketones, UA: POSITIVE — AB
Nitrite, UA: POSITIVE — AB
Protein, UA: POSITIVE — AB
Spec Grav, UA: 1.01 (ref 1.010–1.025)
Urobilinogen, UA: NEGATIVE U/dL — AB
pH, UA: 6.5 (ref 5.0–8.0)

## 2023-09-15 MED ORDER — TRIAMCINOLONE ACETONIDE 10 MG/ML IJ SUSP: 2.5000 mg | Freq: Once | INTRAMUSCULAR | Status: AC

## 2023-09-15 MED ORDER — VALACYCLOVIR HCL 1 G PO TABS
2000.0000 mg | ORAL_TABLET | Freq: Two times a day (BID) | ORAL | 0 refills | Status: AC | PRN
Start: 2023-09-15 — End: 2023-09-16

## 2023-09-15 MED ORDER — MELOXICAM 15 MG PO TABS: 15.0000 mg | ORAL_TABLET | Freq: Every day | ORAL | 0 refills | Status: DC

## 2023-09-15 MED ORDER — DEXAMETHASONE SODIUM PHOSPHATE 120 MG/30ML IJ SOLN: 4.0000 mg | Freq: Once | INTRAMUSCULAR | Status: AC

## 2023-09-15 NOTE — Patient Instructions (Signed)

## 2023-09-15 NOTE — Progress Notes (Signed)
  Subjective:  Patient ID: Morgan [name], female    DOB: [birth year],   MRN: [id number]  Chief Complaint  Patient presents with   Foot Pain    I'm having heel and ankle pain on both feet.    48 y.o. female presents for concern of heel and ankle pain in both feet. This has been ongoing for years. Relates pain after first steps in the morning. She relates aching pain when walking on her feet. She works on her feet at [employer]. She also relates some numbness in her feet and has some lower back arthritis as well. She has taken meloxicam  in the past without much help she believes but was taking for back. She also has tried various inserts in work shoes.  . Denies any other pedal complaints. Denies n/v/f/c.   Past Medical History:  Diagnosis Date   Abortion 03/09/2013   Allergy     Anxiety    Asthma    Depression    Eczema    Headache    Kidney stone    Migraines    Pregnancy induced hypertension    Sleep apnea     Objective:  Physical Exam: Vascular: DP/PT pulses 2/4 bilateral. CFT <3 seconds. Normal hair growth on digits. No edema.  Skin. No lacerations or abrasions bilateral feet.  Musculoskeletal: MMT 5/5 bilateral lower extremities in DF, PF, Inversion and Eversion. Deceased ROM in DF of ankle joint. Tender to the medial calcaneal tubercle bilaterally . No pain with achilles, PT or arch. No pain with calcaneal squeeze.  Neurological: Sensation intact to light touch.   Assessment:   1. Plantar fasciitis, right   2. Plantar fasciitis, left      Plan:  Patient was evaluated and treated and all questions answered. Discussed plantar fasciitis with patient.  X-rays reviewed and discussed with patient. No acute fractures or dislocations noted. Mild spurring noted at inferior calcaneus bilateral. Pes planus noted bilateral.  Discussed treatment options including, ice, NSAIDS, supportive shoes, bracing, and stretching. Stretching exercises provided to be done on a daily basis.    Prescription for meloxicam  provided and sent to pharmacy. Patient requesting injection today. Procedure note below.   Follow-up 6 weeks or sooner if any problems arise. In the meantime, encouraged to call the office with any questions, concerns, change in symptoms.   Procedure:  Discussed etiology, pathology, conservative vs. surgical therapies. At this time a plantar fascial injection was recommended.  The patient agreed and a sterile skin prep was applied.  An injection consisting of  1cc dexamethasone  0.5 cc kenalog  and 1cc marcaine mixture was infiltrated at the point of maximal tenderness on the bilateral Heel.  Bandaid applied. The patient tolerated this well and was given instructions for aftercare.    Asberry Failing, DPM

## 2023-09-15 NOTE — Assessment & Plan Note (Signed)
 Hgb A1c 6.0 09/12/23, repeat Hgb a1c

## 2023-09-16 ENCOUNTER — Ambulatory Visit (HOSPITAL_COMMUNITY): Payer: Self-pay

## 2023-09-16 ENCOUNTER — Ambulatory Visit: Payer: Self-pay | Admitting: Nurse Practitioner

## 2023-09-16 MED ORDER — METRONIDAZOLE 500 MG PO TABS: 500.0000 mg | ORAL_TABLET | Freq: Two times a day (BID) | ORAL | 0 refills | Status: AC

## 2023-09-17 LAB — URINE CULTURE
MICRO NUMBER:: 16695807
SPECIMEN QUALITY:: ADEQUATE

## 2023-09-17 LAB — HEMOGLOBIN A1C
Hgb A1c MFr Bld: 6.2 % — ABNORMAL HIGH (ref <5.7)
Mean Plasma Glucose: 131 mg/dL
eAG (mmol/L): 7.3 mmol/L

## 2023-09-18 ENCOUNTER — Other Ambulatory Visit: Payer: Self-pay | Admitting: Nurse Practitioner

## 2023-09-18 DIAGNOSIS — N390 Urinary tract infection, site not specified: Secondary | ICD-10-CM

## 2023-09-18 MED ORDER — CIPROFLOXACIN HCL 500 MG PO TABS: 500.0000 mg | ORAL_TABLET | Freq: Two times a day (BID) | ORAL | 0 refills | Status: AC

## 2023-10-15 ENCOUNTER — Other Ambulatory Visit: Payer: Self-pay | Admitting: Podiatry

## 2023-10-22 ENCOUNTER — Telehealth: Admitting: Physician Assistant

## 2023-10-22 DIAGNOSIS — U071 COVID-19: Secondary | ICD-10-CM

## 2023-10-22 MED ORDER — FLUTICASONE PROPIONATE 50 MCG/ACT NA SUSP: 2.0000 | Freq: Every day | NASAL | 0 refills | Status: AC

## 2023-10-22 MED ORDER — BENZONATATE 100 MG PO CAPS: 100.0000 mg | ORAL_CAPSULE | Freq: Three times a day (TID) | ORAL | 0 refills | Status: AC | PRN

## 2023-10-22 NOTE — Progress Notes (Signed)
 I have spent 5 minutes in review of e-visit questionnaire, review and updating patient chart, medical decision making and response to patient.   Elsie Velma Lunger, PA-C

## 2023-10-22 NOTE — Progress Notes (Signed)
 E-Visit  for Positive Covid Test Result   We are sorry you are not feeling well. We are here to help!  You have tested positive for COVID-19, meaning that you were infected with the novel coronavirus and could give the virus to others.  Most people with COVID-19 have mild illness and can recover at home without medical care. Do not leave your home, except to get medical care. Do not visit public areas and do not go to places where you are unable to wear a mask. It is important that you stay home  to take care for yourself and to help protect other people in your home and community.      Isolation Instructions:   You are to isolate at home until you have been fever free for at least 24 hours without a fever-reducing medication, and symptoms have been steadily improving for 24 hours. At that time,  you can end isolation but need to mask for an additional 5 days.  If you must be around other household members who do not have symptoms, you need to make sure that both you and the family members are masking consistently with a high-quality mask.  If you note any worsening of symptoms despite treatment, please seek an in-person evaluation ASAP. If you note any significant shortness of breath or any chest pain, please seek ER evaluation. Please do not delay care!   Go to the nearest hospital ED for assessment if fever/cough/breathlessness are severe or illness seems like a threat to life.    The following symptoms may appear 2-14 days after exposure: Fever Cough Shortness of breath or difficulty breathing Chills Repeated shaking with chills Muscle pain Headache Sore throat New loss of taste or smell Fatigue Congestion or runny nose Nausea or vomiting Diarrhea  You can use medication such as I have prescribed Tessalon  Perles 100 mg. You may take 1-2 capsules every 8 hours as needed for cough and I have prescribed Fluticasone  nasal spray 2 sprays in each nostril one time per dayasal spray 2  sprays in each nostril one time per day  You may also take acetaminophen  (Tylenol ) as needed for fever.  I have sent a work note to Pharmacologist. You can find by going to the Menu on your homepage, scrolling down to the Communications section, and selecting Letters. Let us  know if you have any issue locating. Take care and feel better soon!   HOME CARE: Only take medications as instructed by your medical team. Drink plenty of fluids and get plenty of rest. A steam or ultrasonic humidifier can help if you have congestion.   GET HELP RIGHT AWAY IF YOU HAVE EMERGENCY WARNING SIGNS.  Call 911 or proceed to your closest emergency facility if: You develop worsening high fever. Trouble breathing Bluish lips or face Persistent pain or pressure in the chest New confusion Inability to wake or stay awake You cough up blood. Your symptoms become more severe Inability to hold down food or fluids  This list is not all possible symptoms. Contact your medical provider for any symptoms that are severe or concerning to you.   Your e-visit answers were reviewed by a board certified advanced clinical practitioner to complete your personal care plan.  Depending on the condition, your plan could have included both over the counter or prescription medications.  If there is a problem please reply once you have received a response from your provider.  Your safety is important to us .  If you  have drug allergies check your prescription carefully.    You can use MyChart to ask questions about today's visit, request a non-urgent call back, or ask for a work or school excuse for 24 hours related to this e-Visit. If it has been greater than 24 hours you will need to follow up with your provider, or enter a new e-Visit to address those concerns. You will get an e-mail in the next two days asking about your experience.  I hope that your e-visit has been valuable and will speed your recovery. Thank you for using  e-visits.

## 2023-10-27 ENCOUNTER — Ambulatory Visit (INDEPENDENT_AMBULATORY_CARE_PROVIDER_SITE_OTHER): Admitting: Podiatry

## 2023-10-27 ENCOUNTER — Encounter: Payer: Self-pay | Admitting: Podiatry

## 2023-10-27 DIAGNOSIS — M722 Plantar fascial fibromatosis: Secondary | ICD-10-CM

## 2023-10-27 NOTE — Progress Notes (Signed)
  Subjective:  Patient ID: Morgan [name], female    DOB: [birth year],   MRN: [id number]  Chief Complaint  Patient presents with   Plantar Fasciitis    I have my good days and my bad days, less bad days.    48 y.o. female presents for follow-up of bilateral mostly right plantar fasciitis. Relates doing better but still has bad days but more good than bad. . Denies any other pedal complaints. Denies n/v/f/c.   Past Medical History:  Diagnosis Date   Abortion 03/09/2013   Allergy     Anxiety    Asthma    Depression    Eczema    Headache    Kidney stone    Migraines    Pregnancy induced hypertension    Sleep apnea     Objective:  Physical Exam: Vascular: DP/PT pulses 2/4 bilateral. CFT <3 seconds. Normal hair growth on digits. No edema.  Skin. No lacerations or abrasions bilateral feet.  Musculoskeletal: MMT 5/5 bilateral lower extremities in DF, PF, Inversion and Eversion. Deceased ROM in DF of ankle joint. Tender to the medial calcaneal tubercle bilaterally . No pain with achilles, PT or arch. No pain with calcaneal squeeze.  Neurological: Sensation intact to light touch.   Assessment:   1. Plantar fasciitis, left   2. Plantar fasciitis, right       Plan:  Patient was evaluated and treated and all questions answered. Discussed plantar fasciitis with patient.  X-rays reviewed and discussed with patient. No acute fractures or dislocations noted. Mild spurring noted at inferior calcaneus bilateral. Pes planus noted bilateral.  Discussed treatment options including, ice, NSAIDS, supportive shoes, bracing, and stretching. Continue stretching and anti-inflammatoires as needed  Follow-up as needed    Asberry Failing, DPM

## 2024-01-19 ENCOUNTER — Encounter: Payer: Self-pay | Admitting: Family

## 2024-01-19 NOTE — Progress Notes (Signed)
   This encounter was created in error - please disregard. No show

## 2024-03-10 ENCOUNTER — Ambulatory Visit
Admission: EM | Admit: 2024-03-10 | Discharge: 2024-03-10 | Disposition: A | Discharge status: Home / Self Care | Attending: Family Medicine | Admitting: Family Medicine

## 2024-03-10 ENCOUNTER — Ambulatory Visit (INDEPENDENT_AMBULATORY_CARE_PROVIDER_SITE_OTHER)

## 2024-03-10 ENCOUNTER — Ambulatory Visit (HOSPITAL_COMMUNITY)

## 2024-03-10 DIAGNOSIS — M25561 Pain in right knee: Secondary | ICD-10-CM

## 2024-03-10 HISTORY — DX: Unspecified osteoarthritis, unspecified site: M19.90

## 2024-03-10 HISTORY — DX: Plantar fascial fibromatosis: M72.2

## 2024-03-10 NOTE — ED Provider Notes (Signed)
 " [facility]  Note:  This document was prepared using Conservation officer, historic buildings and may include unintentional dictation errors.  MRN: [id number] DOB: [birth year]  Subjective:   Morgan [name] is a 49 y.o. female presenting for 1 week history of right knee soreness but became severe in the past 2 days. Has had difficulty with even walking. No fall, trauma, warmth, redness, fever. No history of knee issues.   Current Outpatient Medications  Medication Instructions   benzonatate  (TESSALON ) 100 mg, Oral, 3 times daily PRN   cyclobenzaprine  (FLEXERIL ) 5 mg, Oral, At bedtime PRN   fluticasone  (FLONASE ) 50 MCG/ACT nasal spray 2 sprays, Each Nare, Daily   ibuprofen  (ADVIL ) 400-800 mg, Every 6 hours PRN   meloxicam  (MOBIC ) 15 mg, Oral, Daily   ondansetron  (ZOFRAN ) 4 mg, Every 8 hours PRN   Ubrelvy  50 mg, Oral, Daily PRN, Take one tablet at onset of headache, may repeat 1 tablet in 2 hours, no more than 2 tablets in 24 hours    Allergies[1]  Past Medical History:  Diagnosis Date   Abortion 03/09/2013   Allergy     Anxiety    Asthma    Depression    Eczema    Headache    Kidney stone    Migraines    Pregnancy induced hypertension    Sleep apnea      Past Surgical History:  Procedure Laterality Date   CERVICAL CERCLAGE     all 3 pregnancies   DILATION AND CURETTAGE OF UTERUS  03/05/1999   retained placenta   DILATION AND CURETTAGE OF UTERUS  03/04/2004   SAB   INDUCED ABORTION  2015   x 2   MOUTH SURGERY  03/04/1997   TUBAL LIGATION      Family History  Problem Relation Age of Onset   HIV Mother    Seizures Mother        secondary to drug abuse   Drug abuse Mother    Heart disease Maternal Grandmother    Hypertension Maternal Grandmother    Heart disease Maternal Grandfather    Diabetes Maternal Grandfather    Cancer Maternal Grandfather    Diabetes Paternal Grandmother    Cancer Paternal Grandfather    Eczema Daughter     Allergic rhinitis Daughter    Migraines Neg Hx    Colon cancer Neg Hx    Rectal cancer Neg Hx    Stomach cancer Neg Hx    Colon polyps Neg Hx    Esophageal cancer Neg Hx     Social History   Occupational History   Occupation: Development Worker, Community: [employer]  Tobacco Use   Smoking status: Never    Passive exposure: Past   Smokeless tobacco: Never  Vaping Use   Vaping status: Former   Devices: Tried  Substance and Sexual Activity   Alcohol use: Yes    Comment: occ   Drug use: Not Currently    Types: Marijuana   Sexual activity: Yes    Partners: Male    Birth control/protection: Post-menopausal, Surgical     ROS   Objective:   Vitals: BP 133/89 (BP Location: Left Arm)   Pulse 60   Temp 97.6 F (36.4 C) (Oral)   Resp 16   LMP 11/12/2018 Comment: Pt states tube ablation as well  SpO2 94%   Physical Exam Constitutional:      General: She is not in acute distress.    Appearance: Normal appearance.  She is well-developed. She is not ill-appearing, toxic-appearing or diaphoretic.  HENT:     Head: Normocephalic and atraumatic.     Nose: Nose normal.     Mouth/Throat:     Mouth: Mucous membranes are moist.  Eyes:     General: No scleral icterus.       Right eye: No discharge.        Left eye: No discharge.     Extraocular Movements: Extraocular movements intact.  Cardiovascular:     Rate and Rhythm: Normal rate.  Pulmonary:     Effort: Pulmonary effort is normal.  Musculoskeletal:     Right knee: Swelling (posteriorly) present. No deformity, effusion, erythema, ecchymosis, lacerations, bony tenderness or crepitus. Decreased range of motion. Tenderness (posteriorly with associated swelling) present. No medial joint line, lateral joint line or patellar tendon tenderness. Normal alignment and normal patellar mobility.  Skin:    General: Skin is warm and dry.  Neurological:     General: No focal deficit present.     Mental Status: She is alert and oriented to  person, place, and time.  Psychiatric:        Mood and Affect: Mood normal.        Behavior: Behavior normal.    DG Knee Complete 4 Views Right Result Date: 03/10/2024 CLINICAL DATA:  Right knee pain EXAM: RIGHT KNEE - COMPLETE 4+ VIEW COMPARISON:  None Available. FINDINGS: No evidence of fracture, dislocation, or joint effusion. No evidence of arthropathy or other focal bone abnormality. Soft tissues are unremarkable. IMPRESSION: Negative. Electronically Signed   By: Lynwood Landy Raddle M.D.   On: 03/10/2024 11:52   Assessment and Plan :   PDMP not reviewed this encounter.  1. Acute pain of right knee    Will pursue outpatient ultrasound to rule out popliteal cyst versus DVT.  Will follow-up and finalize treatment plan once I receive those results.    [1]  Allergies Allergen Reactions   Stadol [Butorphanol Tartrate] Itching   Broccoli [Brassica Oleracea] Rash   Collard Greens [Wild Lettuce Extract (Lactuca Virosa)] Rash   Kale Itching and Rash     Christopher Savannah, [place] 03/10/24 1555  "

## 2024-03-10 NOTE — Discharge Instructions (Addendum)
 We have secured an appointment for you at 1 PM to have an ultrasound done of your right lower leg.  Please go to the address listed on the fourth floor in room 3.

## 2024-03-10 NOTE — ED Triage Notes (Signed)
 Pt presents for evaluation of (R) knee and thigh pain. No injury, fall, or heavy lifting. Pt has been out of work for 2 weeks and returned on Monday. Pt does heavy lifting at work, but she has been there over 20 years. Pt reports some swelling. Pt has not iced or elevated knee. Pt has continued her regular Meloxicam  prescription with no relief.

## 2024-03-11 ENCOUNTER — Ambulatory Visit (HOSPITAL_COMMUNITY)
Admission: RE | Admit: 2024-03-11 | Discharge: 2024-03-11 | Disposition: A | Discharge status: Home / Self Care | Source: Ambulatory Visit | Attending: Urgent Care | Admitting: Urgent Care

## 2024-03-11 ENCOUNTER — Ambulatory Visit: Payer: Self-pay | Admitting: Urgent Care

## 2024-03-11 DIAGNOSIS — M79661 Pain in right lower leg: Secondary | ICD-10-CM | POA: Diagnosis present

## 2024-03-11 DIAGNOSIS — M79604 Pain in right leg: Secondary | ICD-10-CM

## 2024-04-07 ENCOUNTER — Ambulatory Visit
Admission: RE | Admit: 2024-04-07 | Discharge: 2024-04-07 | Disposition: A | Discharge status: Home / Self Care | Source: Ambulatory Visit

## 2024-04-07 VITALS — BP 161/91 | HR 77 | Temp 98.9°F | Resp 18

## 2024-04-07 DIAGNOSIS — R070 Pain in throat: Secondary | ICD-10-CM | POA: Diagnosis not present

## 2024-04-07 DIAGNOSIS — K115 Sialolithiasis: Secondary | ICD-10-CM

## 2024-04-07 LAB — POCT RAPID STREP A (OFFICE): Rapid Strep A Screen: NEGATIVE

## 2024-04-07 MED ORDER — AMOXICILLIN-POT CLAVULANATE 875-125 MG PO TABS: 1.0000 | ORAL_TABLET | Freq: Two times a day (BID) | ORAL | 0 refills | Status: AC

## 2024-04-07 MED ORDER — KETOROLAC TROMETHAMINE 30 MG/ML IJ SOLN
30.0000 mg | Freq: Once | INTRAMUSCULAR | Status: AC
  Administered 2024-04-07: 30 mg via INTRAMUSCULAR

## 2024-04-07 NOTE — ED Provider Notes (Signed)
 " [facility]  Note:  This document was prepared using Conservation officer, historic buildings and may include unintentional dictation errors.  MRN: [id number] DOB: 05/05/1975  Subjective:   Morgan [name] is a 49 y.o. female presenting for 1 day history of acute onset rapidly progressing moderate to severe throat pain, painful swallowing, difficulty opening up her mouth fully.  Patient has swelling of the left side of her face and jaw.  Had all of her vaccinations.  Current Outpatient Medications  Medication Instructions   benzonatate  (TESSALON ) 100 mg, Oral, 3 times daily PRN   cyclobenzaprine  (FLEXERIL ) 5 mg, Oral, At bedtime PRN   diphenhydrAMINE  (BENADRYL ) 50 mg, At bedtime PRN   fluticasone  (FLONASE ) 50 MCG/ACT nasal spray 2 sprays, Each Nare, Daily   ibuprofen  (ADVIL ) 400-800 mg, Every 6 hours PRN   meloxicam  (MOBIC ) 15 mg, Oral, Daily   ondansetron  (ZOFRAN ) 4 mg, Every 8 hours PRN   Ubrelvy  50 mg, Oral, Daily PRN, Take one tablet at onset of headache, may repeat 1 tablet in 2 hours, no more than 2 tablets in 24 hours   valACYclovir  (VALTREX ) 2,000 mg, 2 times daily    Allergies[1]  Past Medical History:  Diagnosis Date   Abortion 03/09/2013   Allergy     Anxiety    Arthritis    Asthma    Depression    Eczema    Headache    Kidney stone    Migraines    Plantar fasciitis    Pregnancy induced hypertension    Sleep apnea      Past Surgical History:  Procedure Laterality Date   CERVICAL CERCLAGE     all 3 pregnancies   DILATION AND CURETTAGE OF UTERUS  03/05/1999   retained placenta   DILATION AND CURETTAGE OF UTERUS  03/04/2004   SAB   INDUCED ABORTION  2015   x 2   MOUTH SURGERY  03/04/1997   TUBAL LIGATION      Family History  Problem Relation Age of Onset   HIV Mother    Seizures Mother        secondary to drug abuse   Drug abuse Mother    Heart disease Maternal Grandmother    Hypertension Maternal Grandmother    Heart disease  Maternal Grandfather    Diabetes Maternal Grandfather    Cancer Maternal Grandfather    Diabetes Paternal Grandmother    Cancer Paternal Grandfather    Eczema Daughter    Allergic rhinitis Daughter    Migraines Neg Hx    Colon cancer Neg Hx    Rectal cancer Neg Hx    Stomach cancer Neg Hx    Colon polyps Neg Hx    Esophageal cancer Neg Hx     Social History   Occupational History   Occupation: Development Worker, Community: [employer]  Tobacco Use   Smoking status: Some Days    Types: Cigars    Passive exposure: Past   Smokeless tobacco: Never  Vaping Use   Vaping status: Never Used  Substance and Sexual Activity   Alcohol use: Yes    Comment: occ   Drug use: Yes    Types: Marijuana   Sexual activity: Yes    Partners: Male    Birth control/protection: Post-menopausal, Surgical     ROS   Objective:   Vitals: BP (!) 161/91 (BP Location: Left Arm)   Pulse 77   Temp 98.9 F (37.2 C) (Oral)   Resp 18  LMP 11/12/2018 Comment: Pt states tube ablation as well  SpO2 95%   Physical Exam Constitutional:      General: She is not in acute distress.    Appearance: Normal appearance. She is well-developed. She is not ill-appearing, toxic-appearing or diaphoretic.  HENT:     Head: Normocephalic and atraumatic.     Jaw: Trismus present.      Nose: Nose normal.     Mouth/Throat:     Mouth: Mucous membranes are moist.     Pharynx: No pharyngeal swelling, oropharyngeal exudate, posterior oropharyngeal erythema or uvula swelling.     Tonsils: No tonsillar exudate or tonsillar abscesses. 0 on the right. 0 on the left.   Eyes:     General: No scleral icterus.       Right eye: No discharge.        Left eye: No discharge.     Extraocular Movements: Extraocular movements intact.  Cardiovascular:     Rate and Rhythm: Normal rate.  Pulmonary:     Effort: Pulmonary effort is normal.  Skin:    General: Skin is warm and dry.  Neurological:     General: No focal deficit present.      Mental Status: She is alert and oriented to person, place, and time.  Psychiatric:        Mood and Affect: Mood normal.        Behavior: Behavior normal.     Results for orders placed or performed during the hospital encounter of 04/07/24 (from the past 24 hours)  POCT rapid strep A     Status: None   Collection Time: 04/07/24  5:42 PM  Result Value Ref Range   Rapid Strep A Screen Negative Negative   IM Toradol  30 mg administered in clinic.  Assessment and Plan :   PDMP not reviewed this encounter.  1. Sialolithiasis   2. Throat pain      Will cover for secondary infection with Augmentin .  Emphasized need to drink or consume sour foods in an effort to express her glands.  Counseled patient on potential for adverse effects with medications prescribed/recommended today, ER and return-to-clinic precautions discussed, patient verbalized understanding.     [1]  Allergies Allergen Reactions   Stadol [Butorphanol Tartrate] Itching   Broccoli [Brassica Oleracea] Rash   Collard Greens [Wild Lettuce Extract (Lactuca Virosa)] Rash   Kale Itching and Rash     Christopher Savannah, [place] 04/07/24 1749  "

## 2024-04-07 NOTE — ED Triage Notes (Signed)
 Pt reports sore throat, swelling glands in the left side of neck since last night. Benadryl  gives no relief.
